# Patient Record
Sex: Male | Born: 1935 | Race: White | Hispanic: No | Marital: Single | Smoking: Never smoker
Health system: Southern US, Community
[De-identification: ages and names within clinical notes are randomized; demographics above are authoritative.]

## PROBLEM LIST (undated history)

## (undated) DIAGNOSIS — F32A Depression, unspecified: Secondary | ICD-10-CM

## (undated) DIAGNOSIS — R251 Tremor, unspecified: Secondary | ICD-10-CM

## (undated) DIAGNOSIS — R112 Nausea with vomiting, unspecified: Secondary | ICD-10-CM

## (undated) DIAGNOSIS — Z9989 Dependence on other enabling machines and devices: Secondary | ICD-10-CM

## (undated) DIAGNOSIS — C801 Malignant (primary) neoplasm, unspecified: Secondary | ICD-10-CM

## (undated) DIAGNOSIS — Z9889 Other specified postprocedural states: Secondary | ICD-10-CM

## (undated) DIAGNOSIS — E291 Testicular hypofunction: Secondary | ICD-10-CM

## (undated) DIAGNOSIS — F419 Anxiety disorder, unspecified: Secondary | ICD-10-CM

## (undated) DIAGNOSIS — C069 Malignant neoplasm of mouth, unspecified: Secondary | ICD-10-CM

## (undated) DIAGNOSIS — Z8701 Personal history of pneumonia (recurrent): Secondary | ICD-10-CM

## (undated) DIAGNOSIS — M1990 Unspecified osteoarthritis, unspecified site: Secondary | ICD-10-CM

## (undated) DIAGNOSIS — T7840XA Allergy, unspecified, initial encounter: Secondary | ICD-10-CM

## (undated) DIAGNOSIS — Z8249 Family history of ischemic heart disease and other diseases of the circulatory system: Secondary | ICD-10-CM

## (undated) DIAGNOSIS — Z860100 Personal history of colon polyps, unspecified: Secondary | ICD-10-CM

## (undated) DIAGNOSIS — J45909 Unspecified asthma, uncomplicated: Secondary | ICD-10-CM

## (undated) DIAGNOSIS — Z87442 Personal history of urinary calculi: Secondary | ICD-10-CM

## (undated) DIAGNOSIS — J189 Pneumonia, unspecified organism: Secondary | ICD-10-CM

## (undated) DIAGNOSIS — F329 Major depressive disorder, single episode, unspecified: Secondary | ICD-10-CM

## (undated) HISTORY — DX: Personal history of colonic polyps: Z86.010

## (undated) HISTORY — PX: SHOULDER ARTHROSCOPY: SHX128

## (undated) HISTORY — DX: Testicular hypofunction: E29.1

## (undated) HISTORY — DX: Family history of ischemic heart disease and other diseases of the circulatory system: Z82.49

## (undated) HISTORY — DX: Tremor, unspecified: R25.1

## (undated) HISTORY — PX: HERNIA REPAIR: SHX51

## (undated) HISTORY — DX: Allergy, unspecified, initial encounter: T78.40XA

## (undated) HISTORY — DX: Personal history of colon polyps, unspecified: Z86.0100

---
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
## 2003-08-27 ENCOUNTER — Encounter: Admission: RE | Admit: 2003-08-27 | Discharge: 2003-08-27 | Payer: Self-pay | Admitting: Family Medicine

## 2006-06-07 ENCOUNTER — Encounter: Admission: RE | Admit: 2006-06-07 | Discharge: 2006-06-07 | Payer: Self-pay | Admitting: Family Medicine

## 2006-06-07 ENCOUNTER — Ambulatory Visit: Payer: Self-pay | Admitting: Family Medicine

## 2006-08-28 ENCOUNTER — Ambulatory Visit: Payer: Self-pay | Admitting: Family Medicine

## 2007-02-26 ENCOUNTER — Ambulatory Visit: Payer: Self-pay | Admitting: Family Medicine

## 2007-04-01 ENCOUNTER — Ambulatory Visit: Payer: Self-pay | Admitting: Family Medicine

## 2007-04-23 ENCOUNTER — Ambulatory Visit: Payer: Self-pay | Admitting: Family Medicine

## 2007-05-05 ENCOUNTER — Ambulatory Visit: Payer: Self-pay | Admitting: Family Medicine

## 2007-05-08 ENCOUNTER — Encounter: Admission: RE | Admit: 2007-05-08 | Discharge: 2007-05-08 | Payer: Self-pay | Admitting: Family Medicine

## 2007-09-23 ENCOUNTER — Ambulatory Visit (HOSPITAL_COMMUNITY): Admission: RE | Admit: 2007-09-23 | Discharge: 2007-09-23 | Payer: Self-pay | Admitting: Orthopedic Surgery

## 2007-10-09 HISTORY — PX: KNEE CARTILAGE SURGERY: SHX688

## 2007-12-22 ENCOUNTER — Ambulatory Visit: Payer: Self-pay | Admitting: Family Medicine

## 2007-12-22 ENCOUNTER — Encounter: Admission: RE | Admit: 2007-12-22 | Discharge: 2007-12-22 | Payer: Self-pay | Admitting: Family Medicine

## 2008-03-18 ENCOUNTER — Ambulatory Visit: Payer: Self-pay | Admitting: Family Medicine

## 2008-09-06 ENCOUNTER — Ambulatory Visit: Payer: Self-pay | Admitting: Family Medicine

## 2008-09-06 ENCOUNTER — Encounter: Admission: RE | Admit: 2008-09-06 | Discharge: 2008-09-06 | Payer: Self-pay | Admitting: Family Medicine

## 2008-10-08 HISTORY — PX: CHOLECYSTECTOMY: SHX55

## 2008-11-26 ENCOUNTER — Ambulatory Visit: Payer: Self-pay | Admitting: Family Medicine

## 2008-12-03 ENCOUNTER — Encounter: Admission: RE | Admit: 2008-12-03 | Discharge: 2008-12-03 | Payer: Self-pay | Admitting: Family Medicine

## 2009-02-17 ENCOUNTER — Ambulatory Visit: Payer: Self-pay | Admitting: Family Medicine

## 2009-02-17 ENCOUNTER — Ambulatory Visit: Payer: Self-pay | Admitting: Surgery

## 2009-11-08 ENCOUNTER — Ambulatory Visit: Payer: Self-pay | Admitting: Family Medicine

## 2009-12-08 ENCOUNTER — Ambulatory Visit: Payer: Self-pay | Admitting: Family Medicine

## 2009-12-14 ENCOUNTER — Ambulatory Visit: Payer: Self-pay | Admitting: Physician Assistant

## 2009-12-14 ENCOUNTER — Encounter: Admission: RE | Admit: 2009-12-14 | Discharge: 2009-12-14 | Payer: Self-pay | Admitting: Family Medicine

## 2009-12-19 ENCOUNTER — Ambulatory Visit: Payer: Self-pay | Admitting: Family Medicine

## 2010-06-23 ENCOUNTER — Ambulatory Visit: Payer: Self-pay | Admitting: Family Medicine

## 2010-12-05 ENCOUNTER — Encounter (INDEPENDENT_AMBULATORY_CARE_PROVIDER_SITE_OTHER): Payer: Medicare Other | Admitting: Family Medicine

## 2010-12-05 DIAGNOSIS — I10 Essential (primary) hypertension: Secondary | ICD-10-CM

## 2010-12-05 DIAGNOSIS — E119 Type 2 diabetes mellitus without complications: Secondary | ICD-10-CM

## 2010-12-05 DIAGNOSIS — F329 Major depressive disorder, single episode, unspecified: Secondary | ICD-10-CM

## 2010-12-05 DIAGNOSIS — E291 Testicular hypofunction: Secondary | ICD-10-CM

## 2011-01-02 ENCOUNTER — Ambulatory Visit (INDEPENDENT_AMBULATORY_CARE_PROVIDER_SITE_OTHER): Payer: BC Managed Care – PPO | Admitting: Family Medicine

## 2011-01-02 DIAGNOSIS — E291 Testicular hypofunction: Secondary | ICD-10-CM

## 2011-01-02 DIAGNOSIS — F329 Major depressive disorder, single episode, unspecified: Secondary | ICD-10-CM

## 2011-01-02 DIAGNOSIS — Z79899 Other long term (current) drug therapy: Secondary | ICD-10-CM

## 2011-01-18 ENCOUNTER — Other Ambulatory Visit: Payer: Self-pay | Admitting: Orthopedic Surgery

## 2011-01-18 DIAGNOSIS — R531 Weakness: Secondary | ICD-10-CM

## 2011-01-18 DIAGNOSIS — M25512 Pain in left shoulder: Secondary | ICD-10-CM

## 2011-01-23 ENCOUNTER — Ambulatory Visit
Admission: RE | Admit: 2011-01-23 | Discharge: 2011-01-23 | Disposition: A | Discharge status: Home / Self Care | Payer: Medicare Other | Source: Ambulatory Visit | Attending: Orthopedic Surgery | Admitting: Orthopedic Surgery

## 2011-01-23 DIAGNOSIS — M25512 Pain in left shoulder: Secondary | ICD-10-CM

## 2011-01-23 DIAGNOSIS — R531 Weakness: Secondary | ICD-10-CM

## 2011-02-16 ENCOUNTER — Telehealth: Payer: Self-pay | Admitting: Family Medicine

## 2011-02-20 NOTE — Op Note (Signed)
NAME:  [name], [name]                ACCOUNT NO.:  [id number]   MEDICAL RECORD NO.:  [id number]          PATIENT TYPE:  AMB   LOCATION:  SDS                          FACILITY:  [facility]   PHYSICIAN:  Burnard Bunting, M.D.    DATE OF BIRTH:  08/29/1934   DATE OF PROCEDURE:  09/23/2007  DATE OF DISCHARGE:                               OPERATIVE REPORT   PREOPERATIVE DIAGNOSIS:  Right shoulder biceps tendon tear, bursitis and  synovitis.   POSTOPERATIVE DIAGNOSES:  1. Right shoulder bursitis with early frozen shoulder.  2. Synovitis.  3. Loose bodies in the joint.  4. Significant humeral head arthritis with previously torn and      retracted biceps tendon.   PROCEDURES:  Right shoulder diagnostic arthroscopy, with extensive  debridement of the glenohumeral joint; with removal of loose body and  subacromial decompression.   SURGEON:  Burnard Bunting, M.D.   ASSISTANT:  None.   ANESTHESIA:  General endotracheal.   BLOOD LOSS:  Minimal.   INDICATIONS:  [name] [name] is a 77 year old patient with right shoulder  pain.  He presents now for operative management after failure of  conservative management and explanation of the risks and benefits.   OPERATIVE FINDINGS:  1. Examination under anesthesia:  Range of motion and forward flexion      is 180.  External rotation at 15 degrees adduction is about 60.      External rotation at 90 degrees abduction is about 80.  2. Diagnostic arthroscopy.      a.     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      b.     Loose body within the joint, measuring 6 x 6 mm.  3. Extensive synovitis within the rotator interval.  4. Intact, but somewhat frayed, rotator cuff from the articular and      bursal surface.  5. Significant bursitis.   PROCEDURE IN DETAIL:  The patient was brought to the operating room,  where general endotracheal  anesthesia was induced.  Preoperative  antibiotics were administered.  The patient was placed in the beach-  chair position, with the head in neutral position.  The right shoulder  was prepped, including the right shoulder, arm and hand prepped in  DuraPrep solution.  The patient was marked.  The instillation of saline  and epinephrine was injected into the subacromial space.  A posterior  portal was then created, after covering the axilla with Ioban.  Skin and  subcutaneous tissue was sharply divided.  The scope was placed into the  posterior portal, then from the posterior portal into the glenohumeral  joint.  The anterior portal was then created under direct visualization.  Diagnostic arthroscopy was performed.  Extensive synovitis was present.  This was resected and  ablated using ArthroCare wand, particularly in  the rotator interval.  The biceps tendon was already torn.  The rotator  cuff appeared intact from the articular  surface.  A significant amount  of glenohumeral arthritis was noted on the humeral head.  A  chondroplasty was performed with removal of loose fragments.  A large  loose body was also removed through the anterior portal.  Following  extensive debridement of the humeral head articular surface and  synovitis within the capsule, as well as the labrum where the biceps  tendon was torn.  Attention was directed to the subacromial space and a  lateral portal was created.  Subacromial decompression with release of  the CA ligament was performed.  The rotator cuff had some partial-  thickness tearing, but no full-thickness tearing.  Following a bursitis  resection and subacromial decompression, the instruments were removed  from the portals, and then switched __________  using 3-0 nylon sutures.  The patient tolerated the procedure well without immediate complication.  The portals were closed using 3-0 nylon.  A bulky dressing was applied.      Burnard Bunting, M.D.   Electronically Signed     GSD/MEDQ  D:  09/23/2007  T:  09/23/2007  Job:  [id number]

## 2011-03-20 NOTE — Telephone Encounter (Signed)
DONE

## 2011-04-10 ENCOUNTER — Other Ambulatory Visit: Payer: Self-pay

## 2011-05-11 ENCOUNTER — Encounter: Payer: Self-pay | Admitting: Family Medicine

## 2011-06-28 ENCOUNTER — Other Ambulatory Visit: Payer: Self-pay | Admitting: Orthopedic Surgery

## 2011-06-28 DIAGNOSIS — R609 Edema, unspecified: Secondary | ICD-10-CM

## 2011-06-28 DIAGNOSIS — M25561 Pain in right knee: Secondary | ICD-10-CM

## 2011-06-28 DIAGNOSIS — R531 Weakness: Secondary | ICD-10-CM

## 2011-06-29 ENCOUNTER — Other Ambulatory Visit: Payer: Self-pay | Admitting: *Deleted

## 2011-06-29 ENCOUNTER — Telehealth: Payer: Self-pay | Admitting: Family Medicine

## 2011-06-29 MED ORDER — METFORMIN HCL 850 MG PO TABS: 850.0000 mg | ORAL_TABLET | Freq: Two times a day (BID) | ORAL | Status: DC

## 2011-06-29 MED ORDER — PIOGLITAZONE HCL 15 MG PO TABS: 15.0000 mg | ORAL_TABLET | Freq: Every day | ORAL | Status: DC

## 2011-06-29 NOTE — Telephone Encounter (Signed)
Sent in prescription for Pioglitazone/metformin 15-850mg  1 po BID #60 with 1 refill to [facility]. CM,LPN

## 2011-07-06 ENCOUNTER — Ambulatory Visit
Admission: RE | Admit: 2011-07-06 | Discharge: 2011-07-06 | Disposition: A | Discharge status: Home / Self Care | Payer: Medicare Other | Source: Ambulatory Visit | Attending: Orthopedic Surgery | Admitting: Orthopedic Surgery

## 2011-07-06 DIAGNOSIS — M25561 Pain in right knee: Secondary | ICD-10-CM

## 2011-07-06 DIAGNOSIS — R609 Edema, unspecified: Secondary | ICD-10-CM

## 2011-07-06 DIAGNOSIS — R531 Weakness: Secondary | ICD-10-CM

## 2011-07-09 ENCOUNTER — Telehealth: Payer: Self-pay | Admitting: Family Medicine

## 2011-07-09 MED ORDER — TESTOSTERONE 20.25 MG/ACT (1.62%) TD GEL: 4.0000 | Freq: Every day | TRANSDERMAL | Status: DC

## 2011-07-09 NOTE — Telephone Encounter (Signed)
Called med in left message for pt to make appt to see at end of [facility]

## 2011-07-09 NOTE — Telephone Encounter (Signed)
Renew his AndroGel for one month and set him up an appointment.

## 2011-07-16 LAB — BASIC METABOLIC PANEL
BUN: 17
CO2: 26
Calcium: 9.1
Chloride: 104
Creatinine, Ser: 1.15
GFR calc Af Amer: >60
GFR calc non Af Amer: >60
Glucose, Bld: 123 — ABNORMAL HIGH
Potassium: 4.3
Sodium: 135

## 2011-07-16 LAB — CBC
HCT: 40.8
Hemoglobin: 13.9
MCHC: 34
MCV: 92.2
Platelets: 310
RBC: 4.42
RDW: 13.5
WBC: 6.7

## 2011-08-03 ENCOUNTER — Telehealth: Payer: Self-pay | Admitting: Family Medicine

## 2011-08-03 NOTE — Telephone Encounter (Signed)
On 08/03/11 I called out metformin times 1 month supply with no refills to [facility] and patient was informed that he will need an Ov in order to receive anymore refills. CLS

## 2011-09-24 ENCOUNTER — Telehealth: Payer: Self-pay | Admitting: Internal Medicine

## 2011-09-24 NOTE — Telephone Encounter (Signed)
He needs an appointment for followup. Once you make it then you can renew his meds

## 2011-09-24 NOTE — Telephone Encounter (Signed)
I believe this goes to you 

## 2011-09-25 ENCOUNTER — Telehealth: Payer: Self-pay

## 2011-09-25 ENCOUNTER — Other Ambulatory Visit: Payer: Self-pay

## 2011-09-25 MED ORDER — METFORMIN HCL 850 MG PO TABS: 850.0000 mg | ORAL_TABLET | Freq: Two times a day (BID) | ORAL | Status: DC

## 2011-09-25 MED ORDER — PIOGLITAZONE HCL 15 MG PO TABS: 15.0000 mg | ORAL_TABLET | Freq: Every day | ORAL | Status: DC

## 2011-09-25 NOTE — Telephone Encounter (Signed)
Refilled metformin and actos with no refill he has been called and left message to make an appt

## 2011-09-25 NOTE — Telephone Encounter (Signed)
Left message on cell phone to call and make an appt for a follow-up to receive meds

## 2011-09-25 NOTE — Telephone Encounter (Signed)
Left message for pt to make an appt and refilled med only for 30 days

## 2011-10-15 ENCOUNTER — Encounter (HOSPITAL_COMMUNITY): Payer: Self-pay

## 2011-10-23 ENCOUNTER — Encounter (HOSPITAL_COMMUNITY)
Admission: RE | Admit: 2011-10-23 | Discharge: 2011-10-23 | Disposition: A | Discharge status: Home / Self Care | Payer: Medicare Other | Source: Ambulatory Visit | Attending: Orthopedic Surgery | Admitting: Orthopedic Surgery

## 2011-10-23 ENCOUNTER — Other Ambulatory Visit (HOSPITAL_COMMUNITY): Payer: Self-pay | Admitting: Orthopedic Surgery

## 2011-10-23 ENCOUNTER — Other Ambulatory Visit: Payer: Self-pay

## 2011-10-23 ENCOUNTER — Encounter (HOSPITAL_COMMUNITY): Payer: Self-pay

## 2011-10-23 HISTORY — DX: Other specified postprocedural states: R11.2

## 2011-10-23 HISTORY — DX: Unspecified asthma, uncomplicated: J45.909

## 2011-10-23 HISTORY — DX: Unspecified osteoarthritis, unspecified site: M19.90

## 2011-10-23 HISTORY — DX: Other specified postprocedural states: Z98.890

## 2011-10-23 HISTORY — DX: Malignant (primary) neoplasm, unspecified: C80.1

## 2011-10-23 LAB — DIFFERENTIAL
Lymphocytes Relative: 24 % (ref 12–46)
Lymphs Abs: 1.6 10*3/uL (ref 0.7–4.0)
Monocytes Relative: 10 % (ref 3–12)
Neutrophils Relative %: 61 % (ref 43–77)

## 2011-10-23 LAB — TYPE AND SCREEN
ABO/RH(D): O POS
Antibody Screen: NEGATIVE

## 2011-10-23 LAB — BASIC METABOLIC PANEL
CO2: 27 mEq/L (ref 19–32)
Calcium: 9.9 mg/dL (ref 8.4–10.5)
GFR calc non Af Amer: 81 mL/min — ABNORMAL LOW (ref >90)
Glucose, Bld: 179 mg/dL — ABNORMAL HIGH (ref 70–99)
Potassium: 5.3 mEq/L — ABNORMAL HIGH (ref 3.5–5.1)
Sodium: 138 mEq/L (ref 135–145)

## 2011-10-23 LAB — CBC
Hemoglobin: 14.2 g/dL (ref 13.0–17.0)
MCV: 93 fL (ref 78.0–100.0)
Platelets: 243 10*3/uL (ref 150–400)
RBC: 4.54 MIL/uL (ref 4.22–5.81)
WBC: 6.6 10*3/uL (ref 4.0–10.5)

## 2011-10-23 NOTE — Pre-Procedure Instructions (Signed)
20 [name] Bell  10/23/2011   Your procedure is scheduled on:  Tuesday, Jan 22  Report to [facility] at 0930 AM.  Call this number if you have problems the morning of surgery: [phone]   Remember:   Do not eat food:After Midnight.  May have clear liquids: up to 4 Hours before arrival.  Clear liquids include soda, tea, black coffee, apple or grape juice, broth.  Take these medicines the morning of surgery with A SIP OF WATER: Advair   Do not wear jewelry, make-up or nail polish.  Do not wear lotions, powders, or perfumes. You may wear deodorant.  Do not shave 48 hours prior to surgery.  Do not bring valuables to the hospital.  Contacts, dentures or bridgework may not be worn into surgery.  Leave suitcase in the car. After surgery it may be brought to your room.  For patients admitted to the hospital, checkout time is 11:00 AM the day of discharge.   Patients discharged the day of surgery will not be allowed to drive home.  Name and phone number of your driver: N/A  Special Instructions: CHG Shower Use Special Wash: 1/2 bottle night before surgery and 1/2 bottle morning of surgery.   Please read over the following fact sheets that you were given: Pain Booklet, Coughing and Deep Breathing, Blood Transfusion Information, Total Joint Packet, MRSA Information and Surgical Site Infection Prevention

## 2011-10-24 NOTE — H&P (Signed)
NAME:  [name] Bell, [name] Bell                ACCOUNT NO.:  [id number]  MEDICAL RECORD NO.:  [id number]  LOCATION:  SDS                          FACILITY:  [facility]  PHYSICIAN:  Burnard Bunting, M.D.    DATE OF BIRTH:  1934/07/29  DATE OF ADMISSION:  10/23/2011 DATE OF DISCHARGE:                             HISTORY & PHYSICAL   ____  Q#1.1.  [facility] NUMBER:  [id number]  CHIEF COMPLAINT:  Right knee pain.  HISTORY OF PRESENT ILLNESS:  [name] Bell is a 78 year old patient with right knee pain.  He has incapacitating, debilitating right knee pain that keeps him from walking.  He is using a cane.  He is requiring pain medicine, has night pain, rest pain.  Pain interferes with his activities of daily living.  It is so bad that he cannot work anymore. He is here to discuss surgical intervention when I saw him last, he actually had the food.  Today, he states he is feeling much better.  He has no fever and chills.  He is off all antibiotics.  He states that he has made arrangements with his work in order to take time off.  He is requiring narcotic pain medicine for pain management because other pain management modalities had failed including injection of cortisone and viscosupplementation.  CURRENT MEDICATIONS:  Metformin, lisinopril, simvastatin, AndroGel.  ALLERGIES:  SULFA drugs.  PAST SURGICAL HISTORY:  Bowel and bladder removed, right shoulder surgery, right knee meniscal surgery.  FAMILY HISTORY:  Family medical positive for heart disease.  He is single.  No family history of DVT or pulmonary embolism.  The patient does have support network around town.  He works as a Runner, broadcasting/film/video.  REVIEW OF SYSTEMS:  All other systems reviewed and are negative other than related to right knee.  PHYSICAL EXAMINATION:  GENERAL:  He is well developed, well nourished, in no acute distress.  Alert and oriented.  Normal body mass index. CHEST:  Clear to auscultation.  No wheezing.  No rhonchi. HEART:   Heart beats with regular rate and rhythm. ABDOMEN:  Benign. EXTREMITIES:  Right knee demonstrates palpable pedal pulses, slight varus alignment, medial __________ and lateral joint line tenderness, intact extensor mechanisms, stable collateral cruciate ligament.  No other masses, lymphadenopathy, or skin changes noted in the right knee region.  Radiographs showed tricompartmental osteoarthritis with bone-on-bone changes.  IMPRESSION:  Right knee arthritis refractory to nonoperative management.  PLAN:  Right total knee replacement.  Risks and benefits were discussed with the patient including but not limited to infection, nerve, and vessel damage, and complete pain relief, knee stiffness, additional need for revision, the length of rehab was also discussed.  All questions were answered.     Burnard Bunting, M.D.     GSD/MEDQ  D:  10/23/2011  T:  10/24/2011  Job:  [id number]

## 2011-10-29 ENCOUNTER — Telehealth: Payer: Self-pay | Admitting: Family Medicine

## 2011-10-29 MED ORDER — CEFAZOLIN SODIUM-DEXTROSE 2-3 GM-% IV SOLR
2.0000 g | INTRAVENOUS | Status: DC
  Filled 2011-10-29: qty 50

## 2011-10-29 MED ORDER — SIMVASTATIN 80 MG PO TABS: 80.0000 mg | ORAL_TABLET | ORAL | Status: DC

## 2011-10-29 NOTE — Telephone Encounter (Signed)
SENT MED IN 

## 2011-10-30 ENCOUNTER — Ambulatory Visit (HOSPITAL_COMMUNITY): Payer: Medicare Other | Admitting: Certified Registered"

## 2011-10-30 ENCOUNTER — Ambulatory Visit (HOSPITAL_COMMUNITY): Payer: Medicare Other

## 2011-10-30 ENCOUNTER — Encounter (HOSPITAL_COMMUNITY)
Admission: RE | Disposition: A | Discharge status: Skilled Nursing Facility | Payer: Self-pay | Source: Ambulatory Visit | Attending: Orthopedic Surgery

## 2011-10-30 ENCOUNTER — Inpatient Hospital Stay (HOSPITAL_COMMUNITY)
Admission: RE | Admit: 2011-10-30 | Discharge: 2011-11-06 | DRG: 470 | Disposition: A | Discharge status: Skilled Nursing Facility | Payer: Medicare Other | Source: Ambulatory Visit | Attending: Orthopedic Surgery | Admitting: Orthopedic Surgery

## 2011-10-30 ENCOUNTER — Encounter (HOSPITAL_COMMUNITY): Payer: Self-pay | Admitting: Certified Registered"

## 2011-10-30 ENCOUNTER — Encounter (HOSPITAL_COMMUNITY): Payer: Self-pay | Admitting: *Deleted

## 2011-10-30 DIAGNOSIS — Z01812 Encounter for preprocedural laboratory examination: Secondary | ICD-10-CM

## 2011-10-30 DIAGNOSIS — M1710 Unilateral primary osteoarthritis, unspecified knee: Principal | ICD-10-CM | POA: Diagnosis present

## 2011-10-30 DIAGNOSIS — Z882 Allergy status to sulfonamides status: Secondary | ICD-10-CM

## 2011-10-30 DIAGNOSIS — M1712 Unilateral primary osteoarthritis, left knee: Secondary | ICD-10-CM

## 2011-10-30 DIAGNOSIS — M1711 Unilateral primary osteoarthritis, right knee: Secondary | ICD-10-CM | POA: Diagnosis present

## 2011-10-30 HISTORY — PX: KNEE ARTHROPLASTY: SHX992

## 2011-10-30 LAB — GLUCOSE, CAPILLARY
Glucose-Capillary: 121 mg/dL — ABNORMAL HIGH (ref 70–99)
Glucose-Capillary: 175 mg/dL — ABNORMAL HIGH (ref 70–99)

## 2011-10-30 SURGERY — ARTHROPLASTY, KNEE, TOTAL, USING IMAGELESS COMPUTER-ASSISTED NAVIGATION
Anesthesia: General | Site: Knee | Laterality: Right | Wound class: Clean

## 2011-10-30 MED ORDER — ONDANSETRON HCL 4 MG/2ML IJ SOLN
4.0000 mg | Freq: Once | INTRAMUSCULAR | Status: AC | PRN
  Administered 2011-10-30: 4 mg via INTRAVENOUS

## 2011-10-30 MED ORDER — MIDAZOLAM HCL 2 MG/2ML IJ SOLN: 1.0000 mg | INTRAMUSCULAR | Status: DC | PRN

## 2011-10-30 MED ORDER — NALOXONE HCL 0.4 MG/ML IJ SOLN: 0.4000 mg | INTRAMUSCULAR | Status: DC | PRN

## 2011-10-30 MED ORDER — ONDANSETRON HCL 4 MG/2ML IJ SOLN
INTRAMUSCULAR | Status: DC | PRN
  Administered 2011-10-30: 4 mg via INTRAVENOUS

## 2011-10-30 MED ORDER — FLUTICASONE-SALMETEROL 250-50 MCG/DOSE IN AEPB
1.0000 | INHALATION_SPRAY | Freq: Two times a day (BID) | RESPIRATORY_TRACT | Status: DC
  Administered 2011-10-30 – 2011-11-06 (×11): 1 via RESPIRATORY_TRACT
  Filled 2011-10-30 (×2): qty 14

## 2011-10-30 MED ORDER — ACETAMINOPHEN 325 MG PO TABS: 650.0000 mg | ORAL_TABLET | Freq: Four times a day (QID) | ORAL | Status: DC | PRN

## 2011-10-30 MED ORDER — ACETAMINOPHEN 10 MG/ML IV SOLN
INTRAVENOUS | Status: AC
  Filled 2011-10-30: qty 100

## 2011-10-30 MED ORDER — SODIUM CHLORIDE 0.9 % IJ SOLN: 9.0000 mL | INTRAMUSCULAR | Status: DC | PRN

## 2011-10-30 MED ORDER — VANCOMYCIN HCL IN DEXTROSE 1-5 GM/200ML-% IV SOLN
1000.0000 mg | Freq: Two times a day (BID) | INTRAVENOUS | Status: DC
  Administered 2011-10-30: 1000 mg via INTRAVENOUS
  Filled 2011-10-30 (×3): qty 200

## 2011-10-30 MED ORDER — FENTANYL CITRATE 0.05 MG/ML IJ SOLN: 50.0000 ug | INTRAMUSCULAR | Status: DC | PRN

## 2011-10-30 MED ORDER — MIDAZOLAM HCL 5 MG/5ML IJ SOLN
INTRAMUSCULAR | Status: DC | PRN
  Administered 2011-10-30: 2 mg via INTRAVENOUS

## 2011-10-30 MED ORDER — ROSUVASTATIN CALCIUM 20 MG PO TABS
20.0000 mg | ORAL_TABLET | Freq: Every day | ORAL | Status: DC
  Administered 2011-10-30 – 2011-11-05 (×7): 20 mg via ORAL
  Filled 2011-10-30 (×8): qty 1

## 2011-10-30 MED ORDER — ONDANSETRON HCL 4 MG/2ML IJ SOLN: 4.0000 mg | Freq: Four times a day (QID) | INTRAMUSCULAR | Status: DC | PRN

## 2011-10-30 MED ORDER — DROPERIDOL 2.5 MG/ML IJ SOLN
INTRAMUSCULAR | Status: DC | PRN
  Administered 2011-10-30: 0.625 mg via INTRAVENOUS

## 2011-10-30 MED ORDER — CHLORHEXIDINE GLUCONATE 4 % EX LIQD: 60.0000 mL | Freq: Once | CUTANEOUS | Status: DC

## 2011-10-30 MED ORDER — MENTHOL 3 MG MT LOZG
1.0000 | LOZENGE | OROMUCOSAL | Status: DC | PRN
  Filled 2011-10-30: qty 9

## 2011-10-30 MED ORDER — CLONIDINE HCL (ANALGESIA) 100 MCG/ML EP SOLN
EPIDURAL | Status: DC | PRN
  Administered 2011-10-30: .9 mL via INTRA_ARTICULAR

## 2011-10-30 MED ORDER — WARFARIN SODIUM 7.5 MG PO TABS
7.5000 mg | ORAL_TABLET | Freq: Once | ORAL | Status: AC
  Administered 2011-10-30: 7.5 mg via ORAL
  Filled 2011-10-30: qty 1

## 2011-10-30 MED ORDER — ONDANSETRON HCL 4 MG PO TABS: 4.0000 mg | ORAL_TABLET | Freq: Four times a day (QID) | ORAL | Status: DC | PRN

## 2011-10-30 MED ORDER — DIPHENHYDRAMINE HCL 50 MG/ML IJ SOLN: 12.5000 mg | Freq: Four times a day (QID) | INTRAMUSCULAR | Status: DC | PRN

## 2011-10-30 MED ORDER — METHOCARBAMOL 500 MG PO TABS: 500.0000 mg | ORAL_TABLET | Freq: Four times a day (QID) | ORAL | Status: DC | PRN

## 2011-10-30 MED ORDER — VITAMIN C 500 MG PO TABS
1000.0000 mg | ORAL_TABLET | Freq: Every day | ORAL | Status: DC
Start: 2011-10-30 — End: 2011-11-06
  Administered 2011-10-30 – 2011-11-06 (×8): 1000 mg via ORAL
  Filled 2011-10-30 (×8): qty 2

## 2011-10-30 MED ORDER — LISINOPRIL 10 MG PO TABS
10.0000 mg | ORAL_TABLET | Freq: Every day | ORAL | Status: DC
  Administered 2011-10-31 – 2011-11-06 (×6): 10 mg via ORAL
  Filled 2011-10-30 (×7): qty 1

## 2011-10-30 MED ORDER — MORPHINE SULFATE 4 MG/ML IJ SOLN
INTRAMUSCULAR | Status: DC | PRN
  Administered 2011-10-30: 4 mg via SUBCUTANEOUS
  Administered 2011-10-30: 4 mg via INTRAVENOUS

## 2011-10-30 MED ORDER — VANCOMYCIN HCL IN DEXTROSE 1-5 GM/200ML-% IV SOLN
INTRAVENOUS | Status: AC
  Filled 2011-10-30: qty 200

## 2011-10-30 MED ORDER — METHOCARBAMOL 100 MG/ML IJ SOLN
500.0000 mg | INTRAVENOUS | Status: AC
  Administered 2011-10-30: 500 mg via INTRAVENOUS
  Filled 2011-10-30: qty 5

## 2011-10-30 MED ORDER — FENTANYL CITRATE 0.05 MG/ML IJ SOLN
INTRAMUSCULAR | Status: DC | PRN
  Administered 2011-10-30 (×2): 50 ug via INTRAVENOUS
  Administered 2011-10-30: 100 ug via INTRAVENOUS
  Administered 2011-10-30: 150 ug via INTRAVENOUS
  Administered 2011-10-30 (×2): 50 ug via INTRAVENOUS
  Administered 2011-10-30: 100 ug via INTRAVENOUS

## 2011-10-30 MED ORDER — HYDROMORPHONE HCL PF 1 MG/ML IJ SOLN
0.2500 mg | INTRAMUSCULAR | Status: DC | PRN
  Administered 2011-10-30 (×2): 0.5 mg via INTRAVENOUS

## 2011-10-30 MED ORDER — FENTANYL CITRATE 0.05 MG/ML IJ SOLN
INTRAMUSCULAR | Status: AC
  Filled 2011-10-30: qty 2

## 2011-10-30 MED ORDER — PROPOFOL 10 MG/ML IV EMUL
INTRAVENOUS | Status: DC | PRN
  Administered 2011-10-30: 180 mg via INTRAVENOUS

## 2011-10-30 MED ORDER — PATIENT'S GUIDE TO USING COUMADIN BOOK
Freq: Once | Status: AC
  Administered 2011-10-30: 21:00:00
  Filled 2011-10-30: qty 1

## 2011-10-30 MED ORDER — LACTATED RINGERS IV SOLN
INTRAVENOUS | Status: DC
  Administered 2011-10-30: 10:00:00 via INTRAVENOUS

## 2011-10-30 MED ORDER — ACETAMINOPHEN 10 MG/ML IV SOLN
INTRAVENOUS | Status: DC | PRN
  Administered 2011-10-30: 1000 mg via INTRAVENOUS

## 2011-10-30 MED ORDER — VANCOMYCIN HCL IN DEXTROSE 1-5 GM/200ML-% IV SOLN
1000.0000 mg | Freq: Once | INTRAVENOUS | Status: AC
  Administered 2011-10-30: 1000 mg via INTRAVENOUS

## 2011-10-30 MED ORDER — METOCLOPRAMIDE HCL 5 MG/ML IJ SOLN
5.0000 mg | Freq: Three times a day (TID) | INTRAMUSCULAR | Status: DC | PRN
  Filled 2011-10-30: qty 2

## 2011-10-30 MED ORDER — BUPIVACAINE-EPINEPHRINE 0.5% -1:200000 IJ SOLN
INTRAMUSCULAR | Status: DC | PRN
  Administered 2011-10-30: 20 mL

## 2011-10-30 MED ORDER — HYDROMORPHONE 0.3 MG/ML IV SOLN
INTRAVENOUS | Status: DC
Start: 2011-10-30 — End: 2011-11-01
  Administered 2011-10-30: 19:00:00 via INTRAVENOUS
  Administered 2011-10-30: 0.3 mg via INTRAVENOUS
  Administered 2011-10-31: 0.9 mg via INTRAVENOUS
  Administered 2011-10-31: 1.2 mg via INTRAVENOUS
  Administered 2011-10-31: 0.3 mg via INTRAVENOUS
  Administered 2011-10-31 (×2): 0.6 mg via INTRAVENOUS
  Administered 2011-10-31: 0.3 mg via INTRAVENOUS
  Administered 2011-11-01: 0.9 mg via INTRAVENOUS
  Administered 2011-11-01: 0.3 mg via INTRAVENOUS
  Filled 2011-10-30: qty 25

## 2011-10-30 MED ORDER — ROCURONIUM BROMIDE 100 MG/10ML IV SOLN
INTRAVENOUS | Status: DC | PRN
  Administered 2011-10-30: 50 mg via INTRAVENOUS

## 2011-10-30 MED ORDER — ACETAMINOPHEN 650 MG RE SUPP: 650.0000 mg | Freq: Four times a day (QID) | RECTAL | Status: DC | PRN

## 2011-10-30 MED ORDER — PIOGLITAZONE HCL 15 MG PO TABS
15.0000 mg | ORAL_TABLET | Freq: Every day | ORAL | Status: DC
  Administered 2011-10-30 – 2011-11-06 (×8): 15 mg via ORAL
  Filled 2011-10-30 (×8): qty 1

## 2011-10-30 MED ORDER — OXYCODONE HCL 5 MG PO TABS
5.0000 mg | ORAL_TABLET | ORAL | Status: DC | PRN
  Administered 2011-11-01 – 2011-11-06 (×14): 10 mg via ORAL
  Filled 2011-10-30 (×15): qty 2

## 2011-10-30 MED ORDER — DIPHENHYDRAMINE HCL 12.5 MG/5ML PO ELIX
12.5000 mg | ORAL_SOLUTION | Freq: Four times a day (QID) | ORAL | Status: DC | PRN
  Filled 2011-10-30: qty 5

## 2011-10-30 MED ORDER — HYDROMORPHONE HCL PF 1 MG/ML IJ SOLN: 0.5000 mg | INTRAMUSCULAR | Status: DC | PRN

## 2011-10-30 MED ORDER — VITAMIN B-12 1000 MCG PO TABS
1000.0000 ug | ORAL_TABLET | Freq: Every day | ORAL | Status: DC
  Administered 2011-10-30 – 2011-11-06 (×8): 1000 ug via ORAL
  Filled 2011-10-30 (×8): qty 1

## 2011-10-30 MED ORDER — INSULIN ASPART 100 UNIT/ML ~~LOC~~ SOLN
0.0000 [IU] | Freq: Three times a day (TID) | SUBCUTANEOUS | Status: DC
  Administered 2011-10-31 – 2011-11-01 (×3): 3 [IU] via SUBCUTANEOUS
  Administered 2011-11-01: 2 [IU] via SUBCUTANEOUS
  Administered 2011-11-02: 5 [IU] via SUBCUTANEOUS
  Administered 2011-11-02: 3 [IU] via SUBCUTANEOUS
  Administered 2011-11-02 – 2011-11-03 (×2): 8 [IU] via SUBCUTANEOUS
  Administered 2011-11-03: 5 [IU] via SUBCUTANEOUS
  Administered 2011-11-04: 3 [IU] via SUBCUTANEOUS
  Administered 2011-11-04: 5 [IU] via SUBCUTANEOUS
  Administered 2011-11-04 – 2011-11-05 (×2): 3 [IU] via SUBCUTANEOUS
  Administered 2011-11-05: 2 [IU] via SUBCUTANEOUS
  Administered 2011-11-05 – 2011-11-06 (×3): 5 [IU] via SUBCUTANEOUS
  Filled 2011-10-30 (×3): qty 3

## 2011-10-30 MED ORDER — MULTI-VITAMIN/MINERALS PO TABS
1.0000 | ORAL_TABLET | Freq: Every day | ORAL | Status: DC
  Administered 2011-10-30 – 2011-11-06 (×8): 1 via ORAL
  Filled 2011-10-30 (×8): qty 1

## 2011-10-30 MED ORDER — LACTATED RINGERS IV SOLN
INTRAVENOUS | Status: DC | PRN
  Administered 2011-10-30: 11:00:00 via INTRAVENOUS

## 2011-10-30 MED ORDER — METHOCARBAMOL 100 MG/ML IJ SOLN
500.0000 mg | Freq: Four times a day (QID) | INTRAVENOUS | Status: DC | PRN
  Filled 2011-10-30: qty 5

## 2011-10-30 MED ORDER — METFORMIN HCL 850 MG PO TABS
850.0000 mg | ORAL_TABLET | Freq: Two times a day (BID) | ORAL | Status: DC
  Administered 2011-10-30 – 2011-11-06 (×14): 850 mg via ORAL
  Filled 2011-10-30 (×16): qty 1

## 2011-10-30 MED ORDER — METOCLOPRAMIDE HCL 10 MG PO TABS: 5.0000 mg | ORAL_TABLET | Freq: Three times a day (TID) | ORAL | Status: DC | PRN

## 2011-10-30 MED ORDER — TESTOSTERONE 20.25 MG/ACT (1.62%) TD GEL: 4.0000 | Freq: Every day | TRANSDERMAL | Status: DC

## 2011-10-30 MED ORDER — PHENOL 1.4 % MT LIQD
1.0000 | OROMUCOSAL | Status: DC | PRN
  Filled 2011-10-30: qty 177

## 2011-10-30 MED ORDER — WARFARIN VIDEO
Freq: Once | Status: AC
  Administered 2011-10-30: 21:00:00

## 2011-10-30 MED ORDER — SODIUM CHLORIDE 0.9 % IV SOLN
INTRAVENOUS | Status: DC | PRN
  Administered 2011-10-30 (×2): via INTRAVENOUS

## 2011-10-30 MED ORDER — POTASSIUM CHLORIDE IN NACL 20-0.9 MEQ/L-% IV SOLN
INTRAVENOUS | Status: AC
  Administered 2011-10-30: 22:00:00 via INTRAVENOUS
  Filled 2011-10-30 (×3): qty 1000

## 2011-10-30 MED ORDER — MIDAZOLAM HCL 2 MG/2ML IJ SOLN
INTRAMUSCULAR | Status: AC
  Filled 2011-10-30: qty 2

## 2011-10-30 SURGICAL SUPPLY — 77 items
BANDAGE ELASTIC 4 VELCRO ST LF (GAUZE/BANDAGES/DRESSINGS) ×2 IMPLANT
BANDAGE ELASTIC 6 VELCRO ST LF (GAUZE/BANDAGES/DRESSINGS) ×2 IMPLANT
BANDAGE ESMARK 6X9 LF (GAUZE/BANDAGES/DRESSINGS) ×1 IMPLANT
BLADE SAG 18X100X1.27 (BLADE) ×2 IMPLANT
BLADE SAW SGTL 13.0X1.19X90.0M (BLADE) ×2 IMPLANT
BNDG COHESIVE 6X5 TAN STRL LF (GAUZE/BANDAGES/DRESSINGS) ×2 IMPLANT
BNDG ELASTIC 6X10 VLCR STRL LF (GAUZE/BANDAGES/DRESSINGS) ×6 IMPLANT
BNDG ESMARK 6X9 LF (GAUZE/BANDAGES/DRESSINGS) ×2
BOWL SMART MIX CTS (DISPOSABLE) ×2 IMPLANT
CEMENT HV SMART SET (Cement) ×4 IMPLANT
CLOTH BEACON ORANGE TIMEOUT ST (SAFETY) ×2 IMPLANT
COVER BACK TABLE 24X17X13 BIG (DRAPES) IMPLANT
COVER SURGICAL LIGHT HANDLE (MISCELLANEOUS) ×2 IMPLANT
CUFF TOURNIQUET SINGLE 34IN LL (TOURNIQUET CUFF) ×2 IMPLANT
CUFF TOURNIQUET SINGLE 44IN (TOURNIQUET CUFF) IMPLANT
DRAPE INCISE IOBAN 66X45 STRL (DRAPES) ×2 IMPLANT
DRAPE ORTHO SPLIT 77X108 STRL (DRAPES) ×3
DRAPE PROXIMA HALF (DRAPES) ×2 IMPLANT
DRAPE SURG ORHT 6 SPLT 77X108 (DRAPES) ×3 IMPLANT
DRAPE U-SHAPE 47X51 STRL (DRAPES) ×2 IMPLANT
DRAPE X-RAY CASS 24X20 (DRAPES) IMPLANT
DRSG PAD ABDOMINAL 8X10 ST (GAUZE/BANDAGES/DRESSINGS) ×2 IMPLANT
DURAPREP 26ML APPLICATOR (WOUND CARE) ×2 IMPLANT
ELECT REM PT RETURN 9FT ADLT (ELECTROSURGICAL) ×2
ELECTRODE REM PT RTRN 9FT ADLT (ELECTROSURGICAL) ×1 IMPLANT
EVACUATOR 1/8 PVC DRAIN (DRAIN) ×2 IMPLANT
FACESHIELD LNG OPTICON STERILE (SAFETY) ×2 IMPLANT
FLUID NSS /IRRIG 3000 ML XXX (IV SOLUTION) ×2 IMPLANT
GAUZE SPONGE 4X4 12PLY STRL LF (GAUZE/BANDAGES/DRESSINGS) ×2 IMPLANT
GAUZE XEROFORM 5X9 LF (GAUZE/BANDAGES/DRESSINGS) ×2 IMPLANT
GLOVE BIO SURGEON ST LM GN SZ9 (GLOVE) ×2 IMPLANT
GLOVE BIOGEL PI IND STRL 8 (GLOVE) ×1 IMPLANT
GLOVE BIOGEL PI INDICATOR 8 (GLOVE) ×1
GLOVE SURG ORTHO 8.0 STRL STRW (GLOVE) ×2 IMPLANT
GOWN PREVENTION PLUS LG XLONG (DISPOSABLE) ×2 IMPLANT
GOWN PREVENTION PLUS XLARGE (GOWN DISPOSABLE) ×2 IMPLANT
GOWN STRL NON-REIN LRG LVL3 (GOWN DISPOSABLE) ×6 IMPLANT
HANDPIECE INTERPULSE COAX TIP (DISPOSABLE) ×1
HOOD PEEL AWAY FACE SHEILD DIS (HOOD) ×6 IMPLANT
IMMOBILIZER KNEE 20 (SOFTGOODS)
IMMOBILIZER KNEE 20 THIGH 36 (SOFTGOODS) IMPLANT
IMMOBILIZER KNEE 22 UNIV (SOFTGOODS) ×2 IMPLANT
IMMOBILIZER KNEE 24 THIGH 36 (MISCELLANEOUS) IMPLANT
IMMOBILIZER KNEE 24 UNIV (MISCELLANEOUS)
KIT BASIN OR (CUSTOM PROCEDURE TRAY) ×2 IMPLANT
KIT ROOM TURNOVER OR (KITS) ×2 IMPLANT
MANIFOLD NEPTUNE II (INSTRUMENTS) ×2 IMPLANT
MARKER SPHERE PSV REFLC THRD 5 (MARKER) ×6 IMPLANT
NEEDLE 18GX1X1/2 (RX/OR ONLY) (NEEDLE) ×2 IMPLANT
NEEDLE SPNL 18GX3.5 QUINCKE PK (NEEDLE) ×2 IMPLANT
NS IRRIG 1000ML POUR BTL (IV SOLUTION) ×2 IMPLANT
PACK TOTAL JOINT (CUSTOM PROCEDURE TRAY) ×2 IMPLANT
PAD ARMBOARD 7.5X6 YLW CONV (MISCELLANEOUS) ×4 IMPLANT
PAD CAST 4YDX4 CTTN HI CHSV (CAST SUPPLIES) ×1 IMPLANT
PADDING CAST COTTON 4X4 STRL (CAST SUPPLIES) ×1
PADDING CAST COTTON 6X4 STRL (CAST SUPPLIES) ×6 IMPLANT
PADDING WEBRIL 4 STERILE (GAUZE/BANDAGES/DRESSINGS) ×2 IMPLANT
PADDING WEBRIL 6 STERILE (GAUZE/BANDAGES/DRESSINGS) ×2 IMPLANT
PIN SCHANZ 4MM 130MM (PIN) ×14 IMPLANT
RUBBERBAND STERILE (MISCELLANEOUS) ×2 IMPLANT
SET HNDPC FAN SPRY TIP SCT (DISPOSABLE) ×1 IMPLANT
SPONGE GAUZE 4X4 12PLY (GAUZE/BANDAGES/DRESSINGS) ×2 IMPLANT
SPONGE LAP 18X18 X RAY DECT (DISPOSABLE) IMPLANT
STAPLER VISISTAT 35W (STAPLE) ×2 IMPLANT
SUCTION FRAZIER TIP 10 FR DISP (SUCTIONS) ×2 IMPLANT
SUT ETHILON 3 0 PS 1 (SUTURE) ×4 IMPLANT
SUT VIC AB 0 CTB1 27 (SUTURE) ×6 IMPLANT
SUT VIC AB 1 CT1 27 (SUTURE) ×5
SUT VIC AB 1 CT1 27XBRD ANBCTR (SUTURE) ×5 IMPLANT
SUT VIC AB 2-0 CT1 27 (SUTURE) ×2
SUT VIC AB 2-0 CT1 TAPERPNT 27 (SUTURE) ×2 IMPLANT
SYR 30ML SLIP (SYRINGE) ×2 IMPLANT
SYR TB 1ML LUER SLIP (SYRINGE) ×2 IMPLANT
TOWEL OR 17X24 6PK STRL BLUE (TOWEL DISPOSABLE) ×2 IMPLANT
TOWEL OR 17X26 10 PK STRL BLUE (TOWEL DISPOSABLE) ×8 IMPLANT
TRAY FOLEY CATH 14FR (SET/KITS/TRAYS/PACK) ×2 IMPLANT
WATER STERILE IRR 1000ML POUR (IV SOLUTION) ×6 IMPLANT

## 2011-10-30 NOTE — Transfer of Care (Signed)
Immediate Anesthesia Transfer of Care Note  Patient: [name] Bell  Procedure(s) Performed:  COMPUTER ASSISTED TOTAL KNEE ARTHROPLASTY - right total knee arthroplasty  Patient Location: PACU  Anesthesia Type: General  Level of Consciousness: awake, alert , oriented and patient cooperative  Airway & Oxygen Therapy: Patient Spontanous Breathing and Patient connected to face mask oxygen  Post-op Assessment: Report given to PACU RN, Post -op Vital signs reviewed and stable and Patient moving all extremities  Post vital signs: Reviewed and stable  Complications: No apparent anesthesia complications

## 2011-10-30 NOTE — H&P (View-Only) (Signed)
NAME:  [name] Bell, [name] Bell                ACCOUNT NO.:  [id number]  MEDICAL RECORD NO.:  [id number]  LOCATION:  SDS                          FACILITY:  [facility]  PHYSICIAN:  G. Scott Sandeep Delagarza, M.D.    DATE OF BIRTH:  04/25/1934  DATE OF ADMISSION:  10/23/2011 DATE OF DISCHARGE:                             HISTORY & PHYSICAL   ____  Q#1.1.  [facility] NUMBER:  [id number]  CHIEF COMPLAINT:  Right knee pain.  HISTORY OF PRESENT ILLNESS:  [name] Bell is a 77-year-old patient with right knee pain.  He has incapacitating, debilitating right knee pain that keeps him from walking.  He is using a cane.  He is requiring pain medicine, has night pain, rest pain.  Pain interferes with his activities of daily living.  It is so bad that he cannot work anymore. He is here to discuss surgical intervention when I saw him last, he actually had the food.  Today, he states he is feeling much better.  He has no fever and chills.  He is off all antibiotics.  He states that he has made arrangements with his work in order to take time off.  He is requiring narcotic pain medicine for pain management because other pain management modalities had failed including injection of cortisone and viscosupplementation.  CURRENT MEDICATIONS:  Metformin, lisinopril, simvastatin, AndroGel.  ALLERGIES:  SULFA drugs.  PAST SURGICAL HISTORY:  Bowel and bladder removed, right shoulder surgery, right knee meniscal surgery.  FAMILY HISTORY:  Family medical positive for heart disease.  He is single.  No family history of DVT or pulmonary embolism.  The patient does have support network around town.  He works as a teacher.  REVIEW OF SYSTEMS:  All other systems reviewed and are negative other than related to right knee.  PHYSICAL EXAMINATION:  GENERAL:  He is well developed, well nourished, in no acute distress.  Alert and oriented.  Normal body mass index. CHEST:  Clear to auscultation.  No wheezing.  No rhonchi. HEART:   Heart beats with regular rate and rhythm. ABDOMEN:  Benign. EXTREMITIES:  Right knee demonstrates palpable pedal pulses, slight varus alignment, medial __________ and lateral joint line tenderness, intact extensor mechanisms, stable collateral cruciate ligament.  No other masses, lymphadenopathy, or skin changes noted in the right knee region.  Radiographs showed tricompartmental osteoarthritis with bone-on-bone changes.  IMPRESSION:  Right knee arthritis refractory to nonoperative management.  PLAN:  Right total knee replacement.  Risks and benefits were discussed with the patient including but not limited to infection, nerve, and vessel damage, and complete pain relief, knee stiffness, additional need for revision, the length of rehab was also discussed.  All questions were answered.     G. Scott Shelsea Hangartner, M.D.     GSD/MEDQ  D:  10/23/2011  T:  10/24/2011  Job:  [id number] 

## 2011-10-30 NOTE — Anesthesia Postprocedure Evaluation (Signed)
  Anesthesia Post-op Note  Patient: [name] Bell  Procedure(s) Performed:  COMPUTER ASSISTED TOTAL KNEE ARTHROPLASTY - right total knee arthroplasty  Patient Location: PACU  Anesthesia Type: General  Level of Consciousness: awake, alert  and oriented  Airway and Oxygen Therapy: Patient Spontanous Breathing and Patient connected to nasal cannula oxygen  Post-op Pain: mild  Post-op Assessment: Post-op Vital signs reviewed and Patient's Cardiovascular Status Stable  Post-op Vital Signs: stable  Complications: No apparent anesthesia complications

## 2011-10-30 NOTE — Progress Notes (Signed)
ANTICOAGULATION CONSULT NOTE - Initial Consult  Pharmacy Consult for Coumadin Indication: post op total knee arthroplasty  Allergies  Allergen Reactions  . Sulfa Antibiotics Hives    Patient Measurements:   Heparin Dosing Weight:   Vital Signs: Temp: 97.4 F (36.3 C) (01/22 1700) Temp src: Oral (01/22 1700) BP: 137/81 mmHg (01/22 1700) Pulse Rate: 65  (01/22 1700)  Labs: No results found for this basename: HGB:2,HCT:3,PLT:3,APTT:3,LABPROT:3,INR:3,HEPARINUNFRC:3,CREATININE:3,CKTOTAL:3,CKMB:3,TROPONINI:3 in the last 72 hours CrCl is unknown because there is no height on file for the current visit.  Medical History: Past Medical History  Diagnosis Date  . Allergy     RHINITIS  . Chronic kidney disease     RENAL STONE  . Hypertension   . Hypogonadism male   . Hx of colonic polyps   . FHx: cardiovascular disease   . Seborrheic keratosis   . PONV (postoperative nausea and vomiting)     29 yrs ago; no problems since then  . Diabetes mellitus     type 2 Niddm x 6 yrs  . Asthmatic bronchitis     seasonal  . Arthritis     osteoarthritis  . Cancer     skin cancer removed from back    Medications:  Scheduled:    . Fluticasone-Salmeterol  1 puff Inhalation Q12H  . HYDROmorphone PCA 0.3 mg/mL   Intravenous Q4H  . insulin aspart  0-15 Units Subcutaneous TID WC  . lisinopril  10 mg Oral Daily  . metFORMIN  850 mg Oral BID WC  . methocarbamol(ROBAXIN) IV  500 mg Intravenous To PACU  . multivitamin with minerals  1 tablet Oral Daily  . patient's guide to using coumadin book   Does not apply Once  . pioglitazone  15 mg Oral Daily  . rosuvastatin  20 mg Oral q1800  . vancomycin  1,000 mg Intravenous Once  . vancomycin  1,000 mg Intravenous Q12H  . vitamin B-12  1,000 mcg Oral Daily  . vitamin C  1,000 mg Oral Daily  . warfarin  7.5 mg Oral Once  . warfarin   Does not apply Once  . DISCONTD:  ceFAZolin (ANCEF) IV  2 g Intravenous 60 min Pre-Op  . DISCONTD:  chlorhexidine  60 mL Topical Once  . DISCONTD: chlorhexidine  60 mL Topical Once  . DISCONTD: Testosterone  4 Squirt Transdermal Daily    Assessment: Pt post op total knee arthroplasty. Coumadin for VTE prophylaxis.  Goal of Therapy:  INR 2.0-3.0  Plan: Coumadin 7.5 mg tonight. Daily PT/INR. Ordered coumadin booklet and video for pt.  Eugene Garnet 10/30/2011,9:38 PM

## 2011-10-30 NOTE — Preoperative (Signed)
Beta Blockers   Reason not to administer Beta Blockers:Not Applicable 

## 2011-10-30 NOTE — Transfer of Care (Deleted)
Immediate Anesthesia Transfer of Care Note  Patient: [name] Bell  Procedure(s) Performed:  COMPUTER ASSISTED TOTAL KNEE ARTHROPLASTY - right total knee arthroplasty  Patient Location: PACU  Anesthesia Type: General  Level of Consciousness: awake, alert , oriented and patient cooperative  Airway & Oxygen Therapy: Patient Spontanous Breathing and Patient connected to face mask oxygen  Post-op Assessment: Report given to PACU RN, Post -op Vital signs reviewed and stable and Patient moving all extremities  Post vital signs: Reviewed and stable  Complications: No apparent anesthesia complications 

## 2011-10-30 NOTE — Interval H&P Note (Signed)
History and Physical Interval Note:  10/30/2011 10:45 AM  [name] Bell  has presented today for surgery, with the diagnosis of right knee osteoarthritis  The various methods of treatment have been discussed with the patient and family. After consideration of risks, benefits and other options for treatment, the patient has consented to  Procedure(s): COMPUTER ASSISTED TOTAL KNEE ARTHROPLASTY as a surgical intervention .  The patients' history has been reviewed, patient examined, no change in status, stable for surgery.  I have reviewed the patients' chart and labs.  Questions were answered to the patient's satisfaction.     [name] SCOTT  Pt pos for mrsa will change to vanco preop

## 2011-10-30 NOTE — Brief Op Note (Signed)
10/30/2011  3:38 PM  PATIENT:  [name] Bell  78 y.o. male  PRE-OPERATIVE DIAGNOSIS:  right knee osteoarthritis  POST-OPERATIVE DIAGNOSIS:  right knee osteoarthritis  PROCEDURE:  Procedure(s): COMPUTER ASSISTED TOTAL KNEE ARTHROPLASTY  SURGEON:  Surgeon(s): Cammy Copa, MD  ASSISTANT:vernon*  ANESTHESIA:   general  EBL: 100  ml    Total I/O In: 1350 [I.V.:1350] Out: 300 [Urine:225; Blood:75]  BLOOD ADMINISTERED: none  DRAINS: (knee) Hemovact drain(s) in the knee with  Suction Clamped   LOCAL MEDICATIONS USED:  none  SPECIMEN:  No Specimen  COUNTS:  YES  TOURNIQUET:   Total Tourniquet Time Documented: Thigh (Right) - 120 minutes  DICTATION: .Other Dictation: Dictation Number [id number]  PLAN OF CARE: Admit to inpatient   PATIENT DISPOSITION:  PACU - hemodynamically stable

## 2011-10-30 NOTE — Anesthesia Preprocedure Evaluation (Addendum)
Anesthesia Evaluation  Patient identified by MRN, date of birth, ID band Patient awake    Reviewed: Allergy & Precautions, H&P , NPO status , Patient's Chart, lab work & pertinent test results, reviewed documented beta blocker date and time   History of Anesthesia Complications (+) PONV  Airway Mallampati: II TM Distance: <3 FB Neck ROM: Full    Dental  (+) Teeth Intact   Pulmonary asthma ,  clear to auscultation        Cardiovascular hypertension, Regular Normal    Neuro/Psych    GI/Hepatic   Endo/Other  Diabetes mellitus-, Oral Hypoglycemic Agents  Renal/GU      Musculoskeletal   Abdominal   Peds  Hematology   Anesthesia Other Findings   Reproductive/Obstetrics                          Anesthesia Physical Anesthesia Plan  ASA: III  Anesthesia Plan: General   Post-op Pain Management:    Induction: Intravenous  Airway Management Planned: Oral ETT  Additional Equipment:   Intra-op Plan:   Post-operative Plan: Extubation in OR  Informed Consent: I have reviewed the patients History and Physical, chart, labs and discussed the procedure including the risks, benefits and alternatives for the proposed anesthesia with the patient or authorized representative who has indicated his/her understanding and acceptance.   Dental advisory given  Plan Discussed with:   Anesthesia Plan Comments: (Htn Type 2  DM glucose 121 Obesity GERD H/O postop N/V  Plan GA   Kipp Brood)       Anesthesia Quick Evaluation

## 2011-10-31 ENCOUNTER — Encounter (HOSPITAL_COMMUNITY): Payer: Self-pay | Admitting: Orthopedic Surgery

## 2011-10-31 LAB — BASIC METABOLIC PANEL
BUN: 15 mg/dL (ref 6–23)
CO2: 24 mEq/L (ref 19–32)
Calcium: 8.3 mg/dL — ABNORMAL LOW (ref 8.4–10.5)
Creatinine, Ser: 0.99 mg/dL (ref 0.50–1.35)
Glucose, Bld: 179 mg/dL — ABNORMAL HIGH (ref 70–99)

## 2011-10-31 LAB — CBC
HCT: 34.7 % — ABNORMAL LOW (ref 39.0–52.0)
Hemoglobin: 11.6 g/dL — ABNORMAL LOW (ref 13.0–17.0)
MCH: 31.4 pg (ref 26.0–34.0)
MCV: 94 fL (ref 78.0–100.0)
RBC: 3.69 MIL/uL — ABNORMAL LOW (ref 4.22–5.81)

## 2011-10-31 MED ORDER — VANCOMYCIN HCL IN DEXTROSE 1-5 GM/200ML-% IV SOLN
1000.0000 mg | Freq: Two times a day (BID) | INTRAVENOUS | Status: AC
  Administered 2011-10-31: 1000 mg via INTRAVENOUS
  Filled 2011-10-31: qty 200

## 2011-10-31 MED ORDER — WARFARIN SODIUM 6 MG PO TABS
6.0000 mg | ORAL_TABLET | Freq: Once | ORAL | Status: AC
  Administered 2011-10-31: 6 mg via ORAL
  Filled 2011-10-31 (×2): qty 1

## 2011-10-31 NOTE — Progress Notes (Signed)
Physical Therapy Treatment Patient Details Name: [name] Bell MRN: [id number] DOB: [birth year] Today's Date: 10/31/2011  PT Assessment/Plan  PT - Assessment/Plan Comments on Treatment Session: Pt admitted s/p right TKA and is progressing well.  Pt tolerated ambulation BID as well as exercises.  Left pt in CPM after treatment 0-30 degrees.  Will folllow. PT Plan: Frequency remains appropriate;Discharge plan needs to be updated PT Frequency: 7X/week Follow Up Recommendations: Skilled nursing facility (Pt reports there will be noone to assist at home after all.) Equipment Recommended: Defer to next venue PT Goals  Acute Rehab PT Goals PT Goal Formulation: With patient Time For Goal Achievement: 7 days PT Goal: Sit to Supine/Side - Progress: Progressing toward goal PT Goal: Sit to Stand - Progress: Progressing toward goal PT Goal: Stand to Sit - Progress: Progressing toward goal PT Goal: Ambulate - Progress: Progressing toward goal  PT Treatment Precautions/Restrictions  Precautions Precautions: Knee Required Braces or Orthoses: Yes Knee Immobilizer: On at all times (Right LE.) Restrictions Weight Bearing Restrictions: Yes RLE Weight Bearing: Weight bearing as tolerated Pain 5/10 in right knee.  Pt repositioned. Mobility (including Balance) Bed Mobility Bed Mobility: Yes Supine to Sit: Not tested (comment) Sit to Supine: 4: Min assist;HOB flat Sit to Supine - Details (indicate cue type and reason): Assist for right LE due to pain.  Cues for sequence. Transfers Transfers: Yes Sit to Stand: 4: Min assist;With upper extremity assist;From chair/3-in-1 (2 trials.) Sit to Stand Details (indicate cue type and reason): Assist for balance and to off weight right LE due to pain.  Cues for hand placement. Stand to Sit: 4: Min assist;With upper extremity assist;To chair/3-in-1;To bed (2 trials.) Stand to Sit Details: Assist to slow descent to chair with cues for hand and right LE  placement. Ambulation/Gait Ambulation/Gait: Yes Ambulation/Gait Assistance: 4: Min assist (Min (guard)) Ambulation/Gait Assistance Details (indicate cue type and reason): Guarding for balance only with cues for sequence. Ambulation Distance (Feet): 40 Feet Assistive device: Rolling walker Gait Pattern: Step-to pattern;Decreased step length - right;Decreased stance time - right;Trunk flexed Stairs: No Wheelchair Mobility Wheelchair Mobility: No  Posture/Postural Control Posture/Postural Control: No significant limitations Balance Balance Assessed: No End of Session PT - End of Session Equipment Utilized During Treatment: Gait belt;Right knee immobilizer Activity Tolerance: Patient tolerated treatment well Patient left: in bed;with call bell in reach Nurse Communication: Mobility status for transfers;Mobility status for ambulation General Behavior During Session: [facility] for tasks performed Cognition: [facility] for tasks performed  [name] 10/31/2011, 3:23 PM  10/31/2011 [name], PT, DPT [phone]

## 2011-10-31 NOTE — Progress Notes (Signed)
Physical Therapy Evaluation Patient Details Name: [name] Bell MRN: [id number] DOB: [birth year] Today's Date: 10/31/2011  Problem List:  Patient Active Problem List  Diagnoses  . Arthritis of knee, right    Past Medical History:  Past Medical History  Diagnosis Date  . Allergy     RHINITIS  . Chronic kidney disease     RENAL STONE  . Hypertension   . Hypogonadism male   . Hx of colonic polyps   . FHx: cardiovascular disease   . Seborrheic keratosis   . PONV (postoperative nausea and vomiting)     29 yrs ago; no problems since then  . Diabetes mellitus     type 2 Niddm x 6 yrs  . Asthmatic bronchitis     seasonal  . Arthritis     osteoarthritis  . Cancer     skin cancer removed from back   Past Surgical History:  Past Surgical History  Procedure Date  . Cholecystectomy 2010    GALL BLADDER  . Knee cartilage surgery 2009    Right knee  . Hernia repair     during cholecystectomy  . Knee arthroplasty 10/30/2011    Procedure: COMPUTER ASSISTED TOTAL KNEE ARTHROPLASTY;  Surgeon: Cammy Copa, MD;  Location: [facility] OR;  Service: Orthopedics;  Laterality: Right;  right total knee arthroplasty    PT Assessment/Plan/Recommendation PT Assessment Clinical Impression Statement: Pt is a 78 y/o male admitted s/p right TKA along with the below PT problem list.  Pt would benefit from acute PT to maximize independence and facilitate d/c home with HHPT. PT Recommendation/Assessment: Patient will need skilled PT in the acute care venue PT Problem List: Decreased strength;Decreased range of motion;Decreased activity tolerance;Decreased balance;Decreased mobility;Decreased knowledge of use of DME;Decreased knowledge of precautions;Pain Barriers to Discharge: None PT Therapy Diagnosis : Difficulty walking;Acute pain PT Plan PT Frequency: 7X/week PT Treatment/Interventions: DME instruction;Gait training;Stair training;Functional mobility training;Therapeutic activities;Balance  training;Therapeutic exercise;Patient/family education PT Recommendation Follow Up Recommendations: Home health PT Equipment Recommended: None recommended by PT PT Goals  Acute Rehab PT Goals PT Goal Formulation: With patient Time For Goal Achievement: 7 days Pt will go Supine/Side to Sit: with modified independence PT Goal: Supine/Side to Sit - Progress: Goal set today Pt will go Sit to Supine/Side: with modified independence PT Goal: Sit to Supine/Side - Progress: Goal set today Pt will go Sit to Stand: with modified independence PT Goal: Sit to Stand - Progress: Goal set today Pt will go Stand to Sit: with modified independence PT Goal: Stand to Sit - Progress: Goal set today Pt will Ambulate: >150 feet;with modified independence;with least restrictive assistive device PT Goal: Ambulate - Progress: Goal set today Pt will Go Up / Down Stairs: 1-2 stairs;with min assist;with least restrictive assistive device PT Goal: Up/Down Stairs - Progress: Goal set today Pt will Perform Home Exercise Program: Independently PT Goal: Perform Home Exercise Program - Progress: Goal set today  PT Evaluation Precautions/Restrictions  Precautions Precautions: Knee Required Braces or Orthoses: Yes Knee Immobilizer: On at all times (Right LE.) Restrictions Weight Bearing Restrictions: Yes RLE Weight Bearing: Weight bearing as tolerated Prior Functioning  Home Living Lives With: Other (Comment) (Roommate) Receives Help From:  (Roommate and friend will help at d/c.) Type of Home: House Home Layout: Able to live on main level with bedroom/bathroom;Two level Alternate Level Stairs-Rails:  (Will not utilize) Home Access: Stairs to enter Entrance Stairs-Rails: None Entrance Stairs-Number of Steps: 2 Home Adaptive Equipment: Walker - rolling Prior Function Level  of Independence: Independent with basic ADLs;Independent with homemaking with ambulation;Independent with gait;Independent with  transfers Able to Take Stairs?: Yes Driving: Yes Vocation: Full time employment 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 Cognition Arousal/Alertness: Awake/alert Overall Cognitive Status: Appears within functional limits for tasks assessed Orientation Level: Oriented X4 Sensation/Coordination Sensation Light Touch: Appears Intact Stereognosis: Not tested Hot/Cold: Not tested Proprioception: Not tested Coordination Gross Motor Movements are Fluid and Coordinated: Yes Fine Motor Movements are Fluid and Coordinated: Yes Extremity Assessment RUE Assessment RUE Assessment: Within Functional Limits LUE Assessment LUE Assessment: Within Functional Limits RLE Assessment RLE Assessment: Exceptions to [facility] RLE AROM (degrees) Right Knee Extension 0-130: 5  Right Knee Flexion 0-140: 45  RLE Strength RLE Overall Strength: Deficits;Due to pain RLE Overall Strength Comments: 2/5 LLE Assessment LLE Assessment: Within Functional Limits Pain 5/10 in right knee.  Pt repositioned after treatment. Mobility (including Balance) Bed Mobility Bed Mobility: Yes Supine to Sit: 3: Mod assist;HOB flat;With rails Supine to Sit Details (indicate cue type and reason): Assist for right LE due to pain and trunk to translate anterior over BOS.  Cues for sequence. Transfers Transfers: Yes Sit to Stand: 4: Min assist;With upper extremity assist;From bed;From chair/3-in-1 (2 trials.) Sit to Stand Details (indicate cue type and reason): Assist for balance due to pain and to off weight right LE with stance.  Cues for hand placement. Stand to Sit: 4: Min assist;With upper extremity assist;To chair/3-in-1 (2 trials.) Stand to Sit Details: Assist to slow descent to chair with cues for hand/right LE placement. Ambulation/Gait Ambulation/Gait: Yes Ambulation/Gait Assistance: 4: Min assist Ambulation/Gait Assistance Details (indicate cue type and reason): Assist to off weight right LE with stance due  to pain with cues for sequence/safety inside RW. Ambulation Distance (Feet): 30 Feet Assistive device: Rolling walker Gait Pattern: Step-to pattern;Decreased step length - right;Decreased stance time - right;Trunk flexed Stairs: No Wheelchair Mobility Wheelchair Mobility: No  Posture/Postural Control Posture/Postural Control: No significant limitations Balance Balance Assessed: No Exercise  Total Joint Exercises Ankle Circles/Pumps: AROM;Right;10 reps;Supine Quad Sets: AROM;Right;10 reps;Supine Heel Slides: AAROM;Right;10 reps;Supine End of Session PT - End of Session Equipment Utilized During Treatment: Gait belt;Right knee immobilizer Activity Tolerance: Patient tolerated treatment well Patient left: in chair;with call bell in reach Nurse Communication: Mobility status for transfers;Mobility status for ambulation General Behavior During Session: [facility] for tasks performed Cognition: [facility] for tasks performed  [name] 10/31/2011, 1:01 PM  10/31/2011 [name], PT, DPT [phone]

## 2011-10-31 NOTE — Progress Notes (Signed)
UR COMPLETED  

## 2011-10-31 NOTE — Progress Notes (Signed)
CARE MANAGEMENT NOTE 10/31/2011 Discharge planning. Spoke with patient. Choice offered, preoperatively setup with [facility], no changes made. Rolling walker, 3in1 and CPM have been delivered to his home.

## 2011-10-31 NOTE — Progress Notes (Signed)
ANTICOAGULATION CONSULT NOTE - Follow Up  Pharmacy Consult for Coumadin Indication: post op total knee arthroplasty VTE prophylaxis  Allergies  Allergen Reactions  . Sulfa Antibiotics Hives    Patient Measurements: Height: 5\' 5"  (165.1 cm) (from preadmission 10/23/11) Weight: 192 lb 1.6 oz (87.136 kg) (from preadmission 10/23/11) IBW/kg (Calculated) : 61.5    Vital Signs: Temp: 97.8 F (36.6 C) (01/23 0609) BP: 131/73 mmHg (01/23 0609) Pulse Rate: 73  (01/23 0609)  Labs:  [name] Bell 10/31/11 0625  HGB 11.6*  HCT 34.7*  PLT 240  APTT --  LABPROT 14.4  INR 1.10  HEPARINUNFRC --  CREATININE 0.99  CKTOTAL --  CKMB --  TROPONINI --   Estimated Creatinine Clearance: 65.4 ml/min (by C-G formula based on Cr of 0.99).  Medical History: Past Medical History  Diagnosis Date  . Allergy     RHINITIS  . Chronic kidney disease     RENAL STONE  . Hypertension   . Hypogonadism male   . Hx of colonic polyps   . FHx: cardiovascular disease   . Seborrheic keratosis   . PONV (postoperative nausea and vomiting)     29 yrs ago; no problems since then  . Diabetes mellitus     type 2 Niddm x 6 yrs  . Asthmatic bronchitis     seasonal  . Arthritis     osteoarthritis  . Cancer     skin cancer removed from back    Medications:  Scheduled:     . Fluticasone-Salmeterol  1 puff Inhalation Q12H  . HYDROmorphone PCA 0.3 mg/mL   Intravenous Q4H  . insulin aspart  0-15 Units Subcutaneous TID WC  . lisinopril  10 mg Oral Daily  . metFORMIN  850 mg Oral BID WC  . methocarbamol(ROBAXIN) IV  500 mg Intravenous To PACU  . multivitamin with minerals  1 tablet Oral Daily  . patient's guide to using coumadin book   Does not apply Once  . pioglitazone  15 mg Oral Daily  . rosuvastatin  20 mg Oral q1800  . vancomycin  1,000 mg Intravenous Q12H  . vitamin B-12  1,000 mcg Oral Daily  . vitamin C  1,000 mg Oral Daily  . warfarin  7.5 mg Oral Once  . warfarin   Does not apply Once  .  DISCONTD: chlorhexidine  60 mL Topical Once  . DISCONTD: chlorhexidine  60 mL Topical Once  . DISCONTD: Testosterone  4 Squirt Transdermal Daily  . DISCONTD: vancomycin  1,000 mg Intravenous Q12H    Assessment: Pt POD#1 s/p  Right TKA. Started on Coumadin 10/30/11 for VTE prophylaxis. INR 1.1 today.  CBC decreased, hgb 11.6, Hct 34.7, pltc 240K. No bleeding reported. SCDs   Goal of Therapy:  INR 2.0-3.0  Plan: Coumadin 6 mg tonight. Daily PT/INR.   Arman Filter 10/31/2011,12:36 PM

## 2011-10-31 NOTE — Progress Notes (Signed)
Occupational Therapy Evaluation Patient Details Name: [name] Bell MRN: [id number] DOB: May 27, 1934 Today's Date: 10/31/2011  Problem List:  Patient Active Problem List  Diagnoses  . Arthritis of knee, right    Past Medical History:  Past Medical History  Diagnosis Date  . Allergy     RHINITIS  . Chronic kidney disease     RENAL STONE  . Hypertension   . Hypogonadism male   . Hx of colonic polyps   . FHx: cardiovascular disease   . Seborrheic keratosis   . PONV (postoperative nausea and vomiting)     29 yrs ago; no problems since then  . Asthmatic bronchitis     seasonal  . Arthritis     osteoarthritis  . Cancer     skin cancer removed from back  . Diabetes mellitus     type 2 Niddm x 6 yrs   Past Surgical History:  Past Surgical History  Procedure Date  . Cholecystectomy 2010    GALL BLADDER  . Knee cartilage surgery 2009    Right knee  . Hernia repair     during cholecystectomy  . Knee arthroplasty 10/30/2011    Procedure: COMPUTER ASSISTED TOTAL KNEE ARTHROPLASTY;  Surgeon: Cammy Copa, MD;  Location: [facility] OR;  Service: Orthopedics;  Laterality: Right;  right total knee arthroplasty  . Right total knee 01/22.2013  . Right shoulder     OT Assessment/Plan/Recommendation OT Assessment Clinical Impression Statement: Pt 78 yo s/p R TKA. Immobilizer used when walking. WBAT. Pt will benefit from skilled OT services to max indep with ADL and functional mobility for ADL @ RW level to reach below established goals and facilitate safe D/C. Feel that if pt continues to progress well, that he will be able to go home with [facility] services. Pt will have family and friends to help initially after D/C. Pt has companion at his house every night. OT Recommendation/Assessment: Patient will need skilled OT in the acute care venue OT Problem List: Decreased strength;Decreased knowledge of use of DME or AE;Decreased knowledge of precautions;Pain OT Therapy Diagnosis : Generalized  weakness OT Plan OT Frequency: Min 2X/week OT Treatment/Interventions: Self-care/ADL training;Therapeutic exercise;Energy conservation;DME and/or AE instruction;Therapeutic activities;Patient/family education OT Recommendation Follow Up Recommendations: Home health OT (pending progress) Equipment Recommended: Tub/shower bench Individuals Consulted Consulted and Agree with Results and Recommendations: Patient OT Goals Acute Rehab OT Goals OT Goal Formulation: With patient Time For Goal Achievement: 2 weeks ADL Goals Pt Will Perform Lower Body Bathing: with modified independence;Sit to stand from chair;with adaptive equipment ADL Goal: Lower Body Bathing - Progress: Goal set today Pt Will Perform Upper Body Dressing: with modified independence (Pt will perform LB dressing @ Mod I level with AE) Pt Will Transfer to Toilet: with modified independence;3-in-1;Maintaining weight bearing status;with DME;Ambulation ADL Goal: Toilet Transfer - Progress: Goal set today Pt Will Perform Toileting - Clothing Manipulation: Independently ADL Goal: Toileting - Clothing Manipulation - Progress: Goal set today Pt Will Perform Tub/Shower Transfer: with supervision;with DME;Ambulation;Transfer tub bench ADL Goal: Tub/Shower Transfer - Progress: Goal set today  OT Evaluation Precautions/Restrictions  Precautions Precautions: Knee Required Braces or Orthoses: Yes Knee Immobilizer: On at all times (Right LE.) Restrictions Weight Bearing Restrictions: Yes RLE Weight Bearing: Weight bearing as tolerated Prior Functioning Home Living Lives With: Other (Comment) Receives Help From: Family Type of Home: House Home Layout: Able to live on main level with bedroom/bathroom;Two level Alternate Level Stairs-Rails:  (Will not utilize) Home Access: Stairs to enter Entrance Stairs-Rails:  None Entrance Stairs-Number of Steps: 2 Bathroom Shower/Tub: Engineer, manufacturing systems: Standard Bathroom  Accessibility: Yes How Accessible: Accessible via walker Home Adaptive Equipment: Walker - rolling Prior Function Level of Independence: Independent with basic ADLs;Independent with homemaking with ambulation;Independent with gait;Independent with transfers Able to Take Stairs?: Yes Driving: Yes Vocation: Full time employment 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 Eating/Feeding: Independent Where Assessed - Eating/Feeding: Chair Grooming: Set up;Performed Where Assessed - Grooming: Sitting, chair Upper Body Bathing: Simulated;Set up Where Assessed - Upper Body Bathing: Sitting, chair Lower Body Bathing: Moderate assistance;Simulated Where Assessed - Lower Body Bathing: Sit to stand from chair Upper Body Dressing: Simulated;Set up Where Assessed - Upper Body Dressing: Sitting, chair Lower Body Dressing: Moderate assistance;Performed Where Assessed - Lower Body Dressing: Sit to stand from chair Toilet Transfer: Simulated;Moderate assistance Toilet Transfer Method: Ambulating;Stand pivot Acupuncturist: Bedside commode Toileting - Clothing Manipulation: Modified independent Where Assessed - Toileting Clothing Manipulation: Sit to stand from 3-in-1 or toilet Toileting - Hygiene: Independent Where Assessed - Toileting Hygiene: Standing Tub/Shower Transfer: Not assessed (discussed potential need for tub bench) Tub/Shower Transfer Method: Not assessed Equipment Used: Rolling walker;Sock aid;Reacher;Long-handled sponge Ambulation Related to ADLs: Min A ADL Comments: completing UB ADL with set up. Pt will benefit from use of AE for LB ADL.  Vision/Perception  Vision - History Baseline Vision: Wears glasses all the time Patient Visual Report: No change from baseline Perception Perception: Within Functional Limits Praxis Praxis: Intact Cognition Cognition Arousal/Alertness: Awake/alert Overall Cognitive Status: Appears within functional limits for tasks  assessed Orientation Level: Oriented X4 Sensation/Coordination Sensation Light Touch: Appears Intact Stereognosis: Not tested Hot/Cold: Not tested Proprioception: Not tested Coordination Gross Motor Movements are Fluid and Coordinated: Yes Fine Motor Movements are Fluid and Coordinated: Yes Extremity Assessment RUE Assessment RUE Assessment: Within Functional Limits LUE Assessment LUE Assessment: Within Functional Limits Mobility  Bed Mobility Bed Mobility: No Supine to Sit: 3: Mod assist;HOB flat;With rails Supine to Sit Details (indicate cue type and reason): Assist for right LE due to pain and trunk to translate anterior over BOS.  Cues for sequence. Transfers Transfers: Yes Sit to Stand: 4: Min assist;With upper extremity assist;From bed;From chair/3-in-1 Sit to Stand Details (indicate cue type and reason): Assist for balance due to pain and to off weight right LE with stance.  Cues for hand placement. Stand to Sit: 4: Min assist;With upper extremity assist;To chair/3-in-1 Stand to Sit Details: Assist to slow descent to chair with cues for hand/right LE placement. End of Session OT - End of Session Equipment Utilized During Treatment: Gait belt;Right knee immobilizer Activity Tolerance: Patient limited by pain (limited somewhat by fear of putting weight via RLE) Patient left: in chair;with call bell in reach General Behavior During Session: [facility] for tasks performed Cognition: [facility] for tasks performed   Rayah Fines,HILLARY 10/31/2011, 2:09 PM  [facility], OTR/L  [phone] 10/31/2011

## 2011-10-31 NOTE — Progress Notes (Signed)
Subjective: i spent 10 hrs on my cpm   Objective: Vital signs in last 24 hours: Temp:  [96.5 F (35.8 C)-98.9 F (37.2 C)] 97.8 F (36.6 C) (01/23 0609) Pulse Rate:  [62-101] 73  (01/23 0609) Resp:  [11-22] 19  (01/23 0609) BP: (125-167)/(73-96) 131/73 mmHg (01/23 0609) SpO2:  [94 %-99 %] 94 % (01/23 0609)  Intake/Output from previous day: 01/22 0701 - 01/23 0700 In: 2450 [I.V.:2250; IV Piggyback:200] Out: 1450 [Urine:1175; Drains:200; Blood:75] Intake/Output this shift:    Exam:  Neurovascular intact Sensation intact distally Intact pulses distally Dorsiflexion/Plantar flexion intact  Labs:  Basename 10/31/11 0625  HGB 11.6*    Basename 10/31/11 0625  WBC 9.9  RBC 3.69*  HCT 34.7*  PLT 240    Basename 10/31/11 0625  NA 136  K 4.4  CL 103  CO2 24  BUN 15  CREATININE 0.99  GLUCOSE 179*  CALCIUM 8.3*    Basename 10/31/11 0625  LABPT --  INR 1.10    Assessment/Plan: Pt stable and motivated - PT today - poss dc this weekend   DEAN,GREGORY SCOTT 10/31/2011, 7:34 AM

## 2011-10-31 NOTE — Op Note (Signed)
NAMETENZIN, EDELMAN NO.:  [id number]  MEDICAL RECORD NO.:  [id number]  LOCATION:  5016                         FACILITY:  [facility]  PHYSICIAN:  Burnard Bunting, M.D.    DATE OF BIRTH:  [birth year]  DATE OF PROCEDURE:  10/30/2011 DATE OF DISCHARGE:                              OPERATIVE REPORT   PREOPERATIVE DIAGNOSIS:  Right knee arthritis.  POSTOPERATIVE DIAGNOSIS:  Right knee arthritis.  PROCEDURE:  Right total knee replacement using DePuy posterior cruciate sacrificing rotating platform, size 3 femur, 3 tibia, 12.5 poly, 35 patella.  SURGEON:  Burnard Bunting, MD  ASSISTANT:  Wende Neighbors, P.A.  ANESTHESIA:  General.  ESTIMATED BLOOD LOSS:  100 mL.  DRAINS:  None.  TOURNIQUET TIME:  120 minutes at 300 mmHg.  INDICATIONS:  [name] Bell is a 78 year old patient, right knee arthritis, presents for operative management after explanation of risks and benefits.  PROCEDURE IN DETAIL:  The patient was brought to the operating room, where general endotracheal anesthesia was induced.  Preop antibiotics administered.  Time-out was called.  Right leg was pre scrubbed with alcohol and Betadine, which allowed to air dry, prepped with DuraPrep solution and draped in a sterile manner.  The [name] was used to cover the operative field.  Leg was elevated and exsanguinated with Esmarch wrap.  Tourniquet was inflated.  Anterior approach to knee was made. Skin and subcutaneous tissue sharply divided.  A median parapatellar approach was made.  Precise location was marked with #1 Vicryl suture. Patella was everted.  Fat pad was partially excised.  Soft tissue was elevated medially.  Lateral patellofemoral ligament was released.  Soft tissue was also removed from the anterior distal portion of the femur. At this time, two pins were placed in the proximal medial tibia and distal medial femur.  Computer guidance was then used to obtain hip center rotation, bimalleolar  axis, and various points about the knee. Initial cut on the tibia were made 11.2 off the high, 0 off the low. This was made perpendicular to the mechanical axis.  Subsequent cut was made off the distal femur, which was a 13 mm cut.  Box cut, chamfer cuts were then made.  Tibia was keel punched.  This was all size 3 femur and 3 tibia, with trials in place.  A 15 degree flexion contracture remained even after the box cut and with elevation of posterior tissues.  At this time, re-cuts were made on both the tibia and the femur.  The re-cut on the tibia gave a total resection of 14 mm, re-cut on the tibia gave an actual cut of 16 on the distal femur.  With these cuts in position, trial components were placed.  The patient did achieve full extension with a  12.5 poly, which had excellent flexion and extension with excellent alignment.  The patella was prepared, 24 mm down to 14 with a 35 patella placed with trial components in position including 12.5 poly. The patient had excellent extension and excellent flexion with excellent patellar tracking using no thumbs technique.  Knee was stable to varus valgus stress at 0 and 30 degrees.  Trial components were removed.  True components were placed.  Tourniquet was released.  Bleeding points were encountered, were controlled with electrocautery.  Same stability parameters were maintained.  Incision was then closed over bolster using interrupted inverted #1 Vicryl suture, 0 Vicryl suture, 2-0 Vicryl suture, and skin staples.  Skin incisions were irrigated, and closed using 3-0 nylon suture.  Solution of Marcaine, morphine, and clonidine injected to the knee.  Bulky dressing was applied.  The patient tolerated procedure well without immediate complications, transferred to recovery room in stable condition.  [name]'s assistance was required all times during the case for retraction for neurovascular structures, drilling, opening, closing, her  assistance was medical necessity.     Burnard Bunting, M.D.     GSD/MEDQ  D:  10/30/2011  T:  10/31/2011  Job:  [id number]

## 2011-11-01 LAB — CBC
Hemoglobin: 11.3 g/dL — ABNORMAL LOW (ref 13.0–17.0)
Platelets: 232 10*3/uL (ref 150–400)
RBC: 3.6 MIL/uL — ABNORMAL LOW (ref 4.22–5.81)

## 2011-11-01 LAB — GLUCOSE, CAPILLARY
Glucose-Capillary: 192 mg/dL — ABNORMAL HIGH (ref 70–99)
Glucose-Capillary: 184 mg/dL — ABNORMAL HIGH (ref 70–99)
Glucose-Capillary: 149 mg/dL — ABNORMAL HIGH (ref 70–99)
Glucose-Capillary: 199 mg/dL — ABNORMAL HIGH (ref 70–99)

## 2011-11-01 LAB — PROTIME-INR
INR: 1.43 (ref 0.00–1.49)
Prothrombin Time: 17.7 seconds — ABNORMAL HIGH (ref 11.6–15.2)

## 2011-11-01 MED ORDER — CHLORPROMAZINE HCL 25 MG PO TABS
25.0000 mg | ORAL_TABLET | Freq: Four times a day (QID) | ORAL | Status: DC
  Administered 2011-11-01 (×4): 25 mg via ORAL
  Filled 2011-11-01 (×7): qty 1

## 2011-11-01 MED ORDER — WARFARIN SODIUM 6 MG PO TABS
6.0000 mg | ORAL_TABLET | Freq: Once | ORAL | Status: AC
  Administered 2011-11-01: 6 mg via ORAL
  Filled 2011-11-01: qty 1

## 2011-11-01 MED ORDER — MAGNESIUM CITRATE PO SOLN
1.0000 | Freq: Once | ORAL | Status: AC
  Administered 2011-11-01: 1 via ORAL
  Filled 2011-11-01: qty 296

## 2011-11-01 NOTE — Progress Notes (Signed)
Orthopedic Tech Progress Note Patient Details:  [name] Bell [birth year] [id number]     Knee immobilizer [name], [name] 11/01/2011, 2:50 PM

## 2011-11-01 NOTE — ED Notes (Signed)
Spoke with pt's partner, Molly Maduro, re: NHP for pt.  Pt sleeping soundly after oxy and thorazine.  Molly Maduro believes that pt has decided on ST SNF placement at d/c, as he will not have 24 hour care at home.  Service line CSW will speak with pt and verify plan and pursue placement as appropriate.

## 2011-11-01 NOTE — Progress Notes (Signed)
Physical Therapy Treatment Patient Details Name: [name] Bell MRN: [id number] DOB: 1934/06/01 Today's Date: 11/01/2011  PT Assessment/Plan  PT - Assessment/Plan Comments on Treatment Session: Pt admitted s/p right TKA and is very motivated to progress.  Pt making great progess with ambulation as well as exercises. PT Plan: Frequency remains appropriate;Discharge plan remains appropriate PT Frequency: 7X/week Follow Up Recommendations: Skilled nursing facility Equipment Recommended: Defer to next venue PT Goals  Acute Rehab PT Goals PT Goal Formulation: With patient Time For Goal Achievement: 7 days PT Goal: Sit to Stand - Progress: Progressing toward goal PT Goal: Stand to Sit - Progress: Progressing toward goal PT Goal: Ambulate - Progress: Progressing toward goal PT Goal: Perform Home Exercise Program - Progress: Progressing toward goal  PT Treatment Precautions/Restrictions  Precautions Precautions: Knee Precaution Booklet Issued: No Required Braces or Orthoses: Yes Knee Immobilizer: On at all times (Right LE.) Restrictions Weight Bearing Restrictions: Yes RLE Weight Bearing: Weight bearing as tolerated Pain 2/10 in right knee.  Pt repositioned. Mobility (including Balance) Bed Mobility Bed Mobility: No Supine to Sit: Not tested (comment) Sit to Supine: Not Tested (comment) Transfers Transfers: Yes Sit to Stand: 4: Min assist;With upper extremity assist;From chair/3-in-1 Sit to Stand Details (indicate cue type and reason): Assist to off weight right LE due to pain with cues for hand placement. Stand to Sit: 4: Min assist;With upper extremity assist;To chair/3-in-1 Stand to Sit Details: Assist to slow descent to surface.  Cues for hand and right LE placement. Ambulation/Gait Ambulation/Gait: Yes Ambulation/Gait Assistance: 4: Min assist Ambulation/Gait Assistance Details (indicate cue type and reason): Assist to off weight right LE due to pain with cues for  sequence.   Ambulation Distance (Feet): 100 Feet Assistive device: Rolling walker Gait Pattern: Step-to pattern;Decreased step length - right;Decreased stance time - right;Trunk flexed Stairs: No Wheelchair Mobility Wheelchair Mobility: No  Posture/Postural Control Posture/Postural Control: No significant limitations Balance Balance Assessed: No Exercise  Total Joint Exercises Ankle Circles/Pumps: AROM;Right;10 reps;Supine Quad Sets: AROM;Right;10 reps;Supine Short Arc Quad: AROM;Right;10 reps;Supine Heel Slides: AAROM;Right;10 reps;Supine Hip ABduction/ADduction: AAROM;Right;10 reps;Supine Straight Leg Raises: AAROM;Right;10 reps;Supine End of Session PT - End of Session Equipment Utilized During Treatment: Gait belt;Right knee immobilizer Activity Tolerance: Patient tolerated treatment well Patient left: in chair;with call bell in reach Nurse Communication: Mobility status for transfers;Mobility status for ambulation General Behavior During Session: [facility] for tasks performed Cognition: [facility] for tasks performed  [name] 11/01/2011, 1:16 PM  11/01/2011 [name], PT, DPT [phone]

## 2011-11-01 NOTE — Progress Notes (Signed)
ANTICOAGULATION CONSULT NOTE - Follow Up  Pharmacy Consult for Coumadin Indication: post op total knee arthroplasty VTE prophylaxis  Allergies  Allergen Reactions  . Sulfa Antibiotics Hives    Patient Measurements: Height: 5\' 5"  (165.1 cm) (from preadmission 10/23/11) Weight: 192 lb 1.6 oz (87.136 kg) (from preadmission 10/23/11) IBW/kg (Calculated) : 61.5    Vital Signs: Temp: 98.3 F (36.8 C) (01/24 0659) BP: 138/73 mmHg (01/24 0659) Pulse Rate: 81  (01/24 0659)  Labs:  Basename 11/01/11 0540 10/31/11 0625  HGB 11.3* 11.6*  HCT 33.6* 34.7*  PLT 232 240  APTT -- --  LABPROT 17.7* 14.4  INR 1.43 1.10  HEPARINUNFRC -- --  CREATININE -- 0.99  CKTOTAL -- --  CKMB -- --  TROPONINI -- --   Estimated Creatinine Clearance: 65.4 ml/min (by C-G formula based on Cr of 0.99).  Medical History: Past Medical History  Diagnosis Date  . Allergy     RHINITIS  . Chronic kidney disease     RENAL STONE  . Hypertension   . Hypogonadism male   . Hx of colonic polyps   . FHx: cardiovascular disease   . Seborrheic keratosis   . PONV (postoperative nausea and vomiting)     29 yrs ago; no problems since then  . Asthmatic bronchitis     seasonal  . Arthritis     osteoarthritis  . Cancer     skin cancer removed from back  . Diabetes mellitus     type 2 Niddm x 6 yrs    Admit Complaint:Pt POD#1 s/p  Right TKA. Pharmacist System-Based Medication Review: Anticoagulation Started on Coumadin 10/30/11 for VTE prophylaxis s/p R TKR. INR below goal, but rising s/p 2 doses. H/H and plts stable.  Infectious Disease WBC WNL, pt afebrile, no abx on board.  Cardiovascular VSS, cont on lisinopril, crestor  Endocrinology resumed metformin, Actos ssi; CBGs 155-161  Gastrointestinal / Nutrition CHO modified diet; Vit C & B-12 resumedVit C & B-12 resumed  Nephrology Scr stable, lytes good. UOP good.  Pulmonary O2 94% RA; resp reg/clear; on Advair  Hematology / Oncology CBC stable, no overt  bleeding reported  Best Practices Coumadin vte px /SCDS; poss DC this weekend.   Goal of Therapy:  INR 2.0-3.0  Plan: 1. Repeat Coumadin 6mg  again today 2. Daily INR  Krystel Fletchall K. Allena Katz, PharmD, BCPS.  Clinical Pharmacist Pager [phone]. 11/01/2011 12:33 PM

## 2011-11-01 NOTE — Progress Notes (Signed)
Physical Therapy Treatment Patient Details Name: [name] Bell MRN: [id number] DOB: 1934/01/07 Today's Date: 11/01/2011  PT Assessment/Plan  PT - Assessment/Plan Comments on Treatment Session: Pt's goal was to walk to the bathroom, but quickly realized he could not make it due to fatique and pain, changed to transfer to [facility]. PT Plan: Discharge plan remains appropriate  Precautions/Restrictions  Precautions Precautions: Knee;Fall Precaution Booklet Issued: No Required Braces or Orthoses: Yes Knee Immobilizer: On at all times (Right LE.) Restrictions Weight Bearing Restrictions: Yes RLE Weight Bearing: Weight bearing as tolerated Mobility (including Balance) Transfers Sit to Stand: 4: Min assist Stand to Sit: 3: Mod assist Stand to Sit Details: Onto bedside commode    Exercise   Pt performed ankle pumps and quad sets x 10 reps each.  AAROM/PROM heel slides x 10 reps with approx. 15 degrees of knee flexion.  End of Session PT - End of Session Equipment Utilized During Treatment: Gait belt;Left knee immobilizer Activity Tolerance: Patient limited by pain;Patient limited by fatigue Patient left:  (on [facility]) Nurse Communication: Mobility status for transfers General Behavior During Session: [facility] for tasks performed Cognition: [facility] for tasks performed  [name] Bell 11/01/2011, 3:28 PM

## 2011-11-01 NOTE — Progress Notes (Signed)
Subjective: Pt stable - had a hard day yesterday   Objective: Vital signs in last 24 hours: Temp:  [98.3 F (36.8 C)-98.7 F (37.1 C)] 98.3 F (36.8 C) (01/24 0659) Pulse Rate:  [81-88] 81  (01/24 0659) Resp:  [18-20] 20  (01/24 0747) BP: (138-154)/(73-79) 138/73 mmHg (01/24 0659) SpO2:  [90 %-98 %] 98 % (01/24 0822) FiO2 (%):  [90 %] 90 % (01/23 2013) Weight:  [87.136 kg (192 lb 1.6 oz)] 87.136 kg (192 lb 1.6 oz) (01/23 1157)  Intake/Output from previous day: 01/23 0701 - 01/24 0700 In: 500 [P.O.:500] Out: 3575 [Urine:3575] Intake/Output this shift:    Exam:  Sensation intact distally Intact pulses distally Dorsiflexion/Plantar flexion intact  Labs:  Basename 11/01/11 0540 10/31/11 0625  HGB 11.3* 11.6*    Basename 11/01/11 0540 10/31/11 0625  WBC 10.6* 9.9  RBC 3.60* 3.69*  HCT 33.6* 34.7*  PLT 232 240    Basename 10/31/11 0625  NA 136  K 4.4  CL 103  CO2 24  BUN 15  CREATININE 0.99  GLUCOSE 179*  CALCIUM 8.3*    Basename 11/01/11 0540 10/31/11 0625  LABPT -- --  INR 1.43 1.10    Assessment/Plan: Pt stable - dc ivf - dc pca - mag citrate - thorazine for hiccups   Amelya Mabry SCOTT 11/01/2011, 9:06 AM

## 2011-11-02 LAB — CBC
HCT: 29.9 % — ABNORMAL LOW (ref 39.0–52.0)
Hemoglobin: 10.1 g/dL — ABNORMAL LOW (ref 13.0–17.0)
MCH: 31.7 pg (ref 26.0–34.0)
MCHC: 33.8 g/dL (ref 30.0–36.0)
RDW: 14.7 % (ref 11.5–15.5)

## 2011-11-02 LAB — GLUCOSE, CAPILLARY: Glucose-Capillary: 223 mg/dL — ABNORMAL HIGH (ref 70–99)

## 2011-11-02 LAB — PROTIME-INR: Prothrombin Time: 18.7 seconds — ABNORMAL HIGH (ref 11.6–15.2)

## 2011-11-02 MED ORDER — WARFARIN SODIUM 6 MG PO TABS
6.0000 mg | ORAL_TABLET | Freq: Once | ORAL | Status: AC
  Administered 2011-11-02: 6 mg via ORAL
  Filled 2011-11-02: qty 1

## 2011-11-02 NOTE — Progress Notes (Signed)
Physical Therapy Treatment Patient Details Name: [name] Bell MRN: [id number] DOB: 1934/06/02 Today's Date: 11/02/2011  PT Assessment/Plan  PT - Assessment/Plan Comments on Treatment Session: Pt admitted s/p right TKA with c/o or fatigue this am.  Pt agreeable to attempt PT, but acknowledges that it may "only be a little bit."   PT Plan: Discharge plan remains appropriate;Frequency remains appropriate PT Frequency: 7X/week Follow Up Recommendations: Skilled nursing facility Equipment Recommended: Defer to next venue PT Goals  Acute Rehab PT Goals PT Goal Formulation: With patient Time For Goal Achievement: 7 days PT Goal: Supine/Side to Sit - Progress: Progressing toward goal PT Goal: Sit to Stand - Progress: Progressing toward goal PT Goal: Stand to Sit - Progress: Progressing toward goal PT Goal: Ambulate - Progress: Progressing toward goal PT Goal: Perform Home Exercise Program - Progress: Progressing toward goal  PT Treatment Precautions/Restrictions  Precautions Precautions: Knee;Fall Precaution Booklet Issued: No Required Braces or Orthoses: Yes Knee Immobilizer: On at all times (Right LE.) Restrictions Weight Bearing Restrictions: Yes RLE Weight Bearing: Weight bearing as tolerated Pain 5/10 in right knee.  Pt repositioned. Mobility (including Balance) Bed Mobility Bed Mobility: Yes Supine to Sit: 4: Min assist;With rails;HOB flat Supine to Sit Details (indicate cue type and reason): Assist for right LE only with cues for sequence. Sit to Supine: Not Tested (comment) Transfers Transfers: Yes Sit to Stand: 4: Min assist;With upper extremity assist;From bed Sit to Stand Details (indicate cue type and reason): Assist to off weight right LE due to pain with cues for hand placement. Stand to Sit: 4: Min assist;With upper extremity assist;To chair/3-in-1 Stand to Sit Details: Assist to slow descent with cues for hand/right LE  placement. Ambulation/Gait Ambulation/Gait: Yes Ambulation/Gait Assistance: 4: Min assist (Progressing to min (guard)) Ambulation/Gait Assistance Details (indicate cue type and reason): Assist to initially off weight right LE due to pain.  Distance limited by pain.  Cues for sequence with RW. Ambulation Distance (Feet): 10 Feet Assistive device: Rolling walker Gait Pattern: Step-to pattern;Decreased step length - right;Decreased stance time - right;Trunk flexed Stairs: No Wheelchair Mobility Wheelchair Mobility: No  Posture/Postural Control Posture/Postural Control: No significant limitations Balance Balance Assessed: No Exercise  Total Joint Exercises Ankle Circles/Pumps: AROM;Right;10 reps;Supine Quad Sets: AROM;Right;10 reps;Supine Short Arc Quad: AAROM;Right;10 reps;Supine Heel Slides: AAROM;Right;10 reps;Supine Hip ABduction/ADduction: AAROM;Right;10 reps;Supine Straight Leg Raises: AAROM;Right;10 reps;Supine End of Session PT - End of Session Equipment Utilized During Treatment: Gait belt;Right knee immobilizer Activity Tolerance: Patient limited by pain;Patient limited by fatigue Patient left: in chair;with call bell in reach Nurse Communication: Mobility status for transfers;Mobility status for ambulation General Behavior During Session: [facility] for tasks performed Cognition: [facility] for tasks performed  [name] Bell 11/02/2011, 11:16 AM  11/02/2011 [name] Bell, PT, DPT [phone]

## 2011-11-02 NOTE — Progress Notes (Signed)
OT Cancellation Note  Treatment cancelled today due to patient's refusal to participate. Pt states he is in pain and would prefer to wait til later this afternoon. Will return if able.  [facility] Hannalee Castor, OTR/L  [phone] 11/02/2011 11/02/2011, 4:35 PM

## 2011-11-02 NOTE — Progress Notes (Signed)
Subjective: Pt slow to progress with pt   Objective: Vital signs in last 24 hours: Temp:  [97.6 F (36.4 C)-98.8 F (37.1 C)] 97.7 F (36.5 C) (01/25 0600) Pulse Rate:  [90-114] 90  (01/25 0600) Resp:  [17-20] 18  (01/25 0600) BP: (100-140)/(62-78) 104/63 mmHg (01/25 0600) SpO2:  [92 %-98 %] 93 % (01/25 0600)  Intake/Output from previous day: 01/24 0701 - 01/25 0700 In: 540 [P.O.:540] Out: 550 [Urine:550] Intake/Output this shift:    Exam:  Sensation intact distally Intact pulses distally Dorsiflexion/Plantar flexion intact  Labs:  Basename 11/01/11 0540 10/31/11 0625  HGB 11.3* 11.6*    Basename 11/01/11 0540 10/31/11 0625  WBC 10.6* 9.9  RBC 3.60* 3.69*  HCT 33.6* 34.7*  PLT 232 240    Basename 10/31/11 0625  NA 136  K 4.4  CL 103  CO2 24  BUN 15  CREATININE 0.99  GLUCOSE 179*  CALCIUM 8.3*    Basename 11/02/11 0530 11/01/11 0540  LABPT -- --  INR 1.53* 1.43    Assessment/Plan: Will need snf Monday - incision ok - cont cpm   DEAN,GREGORY SCOTT 11/02/2011, 7:32 AM

## 2011-11-02 NOTE — Progress Notes (Signed)
Admit Complaint:Pt POD#2s/p Right TKA.  Pharmacist System-Based Medication Review: Anticoagulation Started on Coumadin 10/30/11 for VTE prophylaxis s/p R TKA. INR 1.53, but rising s/p 3 doses. H/H and plts stable; SNF Monday   1) Rpt Coumadin 6mg  today. 2) INR in am

## 2011-11-02 NOTE — Progress Notes (Signed)
Spoke to patient about side effects of Thorazine. Patient stated that Thorazine gave him hallucinations and he did not want to ever take it again. Spoke to Dr. August Saucer and Thorazine d/c'd and now listed as allergy. Gildardo Cranker, RN

## 2011-11-03 LAB — GLUCOSE, CAPILLARY
Glucose-Capillary: 178 mg/dL — ABNORMAL HIGH (ref 70–99)
Glucose-Capillary: 237 mg/dL — ABNORMAL HIGH (ref 70–99)

## 2011-11-03 MED ORDER — WARFARIN SODIUM 6 MG PO TABS
6.0000 mg | ORAL_TABLET | Freq: Once | ORAL | Status: AC
  Administered 2011-11-03: 6 mg via ORAL
  Filled 2011-11-03: qty 1

## 2011-11-03 MED ORDER — FLEET ENEMA 7-19 GM/118ML RE ENEM: 1.0000 | ENEMA | Freq: Every day | RECTAL | Status: DC | PRN

## 2011-11-03 NOTE — Progress Notes (Signed)
Physical Therapy Treatment Patient Details Name: [name] Bell MRN: [id number] DOB: 1934-08-20 Today's Date: 11/03/2011  PT Assessment/Plan  PT - Assessment/Plan Comments on Treatment Session: Pt agreeable to allow therapy to help him up to [employer]. Once seated patient stated, "this might take longer than I thought" Pt attempting to have BM. Pt requested to sit on [facility] for a while. RN and NT made aware.  PT Plan: Discharge plan remains appropriate PT Frequency: 7X/week Follow Up Recommendations: Skilled nursing facility Equipment Recommended: Defer to next venue PT Goals  Acute Rehab PT Goals PT Goal: Supine/Side to Sit - Progress: Progressing toward goal PT Goal: Sit to Supine/Side - Progress: Progressing toward goal PT Goal: Sit to Stand - Progress: Progressing toward goal PT Goal: Stand to Sit - Progress: Progressing toward goal  PT Treatment Precautions/Restrictions  Precautions Precautions: Knee;Fall Precaution Booklet Issued: No Required Braces or Orthoses: Yes Knee Immobilizer: On at all times Restrictions Weight Bearing Restrictions: Yes RLE Weight Bearing: Weight bearing as tolerated Mobility (including Balance) Bed Mobility Supine to Sit: 4: Min assist Supine to Sit Details (indicate cue type and reason): A with R LE and use of chuck pad to shirt hips towards EOB prior to sitting up. Cues for safe technique Transfers Sit to Stand: 4: Min assist;From bed;With upper extremity assist Sit to Stand Details (indicate cue type and reason): Cues for LE positioning prior to stand and cues for safe hand placement and to sit slowly Stand to Sit: 4: Min assist;With upper extremity assist;With armrests;To chair/3-in-1 Stand to Sit Details: A to control descent to [facility].  Ambulation/Gait Ambulation/Gait: No    Exercise    End of Session PT - End of Session Equipment Utilized During Treatment: Gait belt;Right knee immobilizer Activity Tolerance: Patient limited by fatigue;Patient  limited by pain Patient left: in chair;with call bell in reach;Other (comment) (NT made aware) Nurse Communication: Mobility status for transfers General Behavior During Session: [facility] for tasks performed Cognition: [facility] for tasks performed  Robinette, Adline Potter 11/03/2011, 1:12 PM 11/03/2011 Fredrich Birks PTA [phone] pager [phone] office

## 2011-11-03 NOTE — Progress Notes (Signed)
Subjective: 4 Days Post-Op Procedure(s) (LRB): COMPUTER ASSISTED TOTAL KNEE ARTHROPLASTY (Right)  Patient reports pain as mild.    Objective:   VITALS:  Temp:  [97.3 F (36.3 C)-98.1 F (36.7 C)] 97.3 F (36.3 C) (01/26 0556) Pulse Rate:  [88-113] 95  (01/26 0556) Resp:  [16-18] 16  (01/26 0556) BP: (118-133)/(66-80) 133/80 mmHg (01/26 0556) SpO2:  [92 %-99 %] 93 % (01/26 0556)  Neurologically intact ABD soft Sensation intact distally Intact pulses distally Incision: dressing C/D/I and no drainage Compartment soft   LABS  Basename 11/02/11 0530 11/01/11 0540  HGB 10.1* 11.3*  WBC 10.3 10.6*  PLT 219 232   No results found for this basename: NA:2,K:2,CL:2,CO2:2,BUN:2,CREATININE:2,GLUCOSE:2, in the last 72 hours  Basename 11/03/11 0600 11/02/11 0530  LABPT -- --  INR 1.78* 1.53*     Assessment/Plan: 4 Days Post-Op Procedure(s) (LRB): COMPUTER ASSISTED TOTAL KNEE ARTHROPLASTY (Right)  Advance diet Up with therapy D/C IV fluids Discharge to SNF  NITKA,JAMES E 11/03/2011, 11:51 AM

## 2011-11-03 NOTE — Progress Notes (Signed)
Admit Complaint:Pt POD#2s/p Right TKA.  Pharmacist System-Based Medication Review: Anticoagulation Started on Coumadin 10/30/11 for VTE prophylaxis s/p R TKA. INR 1.78, but rising s/p 4 doses. Goal 2-3.  No H/H checked today; SNF Monday   Plan: 1) Rpt Coumadin 6mg  today.

## 2011-11-04 LAB — GLUCOSE, CAPILLARY
Glucose-Capillary: 185 mg/dL — ABNORMAL HIGH (ref 70–99)
Glucose-Capillary: 220 mg/dL — ABNORMAL HIGH (ref 70–99)

## 2011-11-04 MED ORDER — ZINC OXIDE 40 % EX OINT
TOPICAL_OINTMENT | Freq: Two times a day (BID) | CUTANEOUS | Status: DC
  Administered 2011-11-04 – 2011-11-06 (×3): via TOPICAL
  Filled 2011-11-04: qty 114

## 2011-11-04 MED ORDER — WARFARIN SODIUM 6 MG PO TABS
6.0000 mg | ORAL_TABLET | Freq: Once | ORAL | Status: AC
  Administered 2011-11-04: 6 mg via ORAL
  Filled 2011-11-04: qty 1

## 2011-11-04 MED ORDER — DIPHENHYDRAMINE HCL 25 MG PO CAPS: 25.0000 mg | ORAL_CAPSULE | Freq: Three times a day (TID) | ORAL | Status: DC | PRN

## 2011-11-04 NOTE — Progress Notes (Signed)
Subjective: 5 Days Post-Op Procedure(s) (LRB): COMPUTER ASSISTED TOTAL KNEE ARTHROPLASTY (Right)  Patient reports pain as mild. He is working with his partner to find a skilled nursing facility near his home for post hospitalization. His spirits are good.   Objective:   VITALS:  Temp:  [97.8 F (36.6 C)-98.6 F (37 C)] 98.5 F (36.9 C) (01/27 0603) Pulse Rate:  [90-100] 90  (01/27 0603) Resp:  [16-18] 16  (01/27 0603) BP: (110-116)/(67-71) 116/71 mmHg (01/27 0603) SpO2:  [94 %-96 %] 96 % (01/27 0603)  Neurologically intact ABD soft Neurovascular intact Sensation intact distally Intact pulses distally Dorsiflexion/Plantar flexion intact Incision: dressing C/D/I No cellulitis present   LABS  Basename 11/02/11 0530  HGB 10.1*  WBC 10.3  PLT 219   No results found for this basename: NA:2,K:2,CL:2,CO2:2,BUN:2,CREATININE:2,GLUCOSE:2, in the last 72 hours  Basename 11/04/11 0500 11/03/11 0600  LABPT -- --  INR 1.99* 1.78*     Assessment/Plan: 5 Days Post-Op Procedure(s) (LRB): COMPUTER ASSISTED TOTAL KNEE ARTHROPLASTY (Right)  Advance diet Up with therapy Discharge to SNF  NITKA,JAMES E 11/04/2011, 12:58 PM

## 2011-11-04 NOTE — Progress Notes (Signed)
Admit Complaint:Pt s/p Right TKA.  Pharmacist System-Based Medication Review: Anticoagulation Started on Coumadin 10/30/11 for VTE prophylaxis s/p R TKA. INR 1.99, but rising s/p 5 doses. Goal 2-3.  No H/H checked today; SNF Monday  Plan:   Rpt Coumadin 6mg  today.

## 2011-11-04 NOTE — Progress Notes (Signed)
Physical Therapy Treatment Patient Details Name: [name] Bell MRN: [id number] DOB: May 24, 1934 Today's Date: 11/04/2011  PT Assessment/Plan  PT - Assessment/Plan Comments on Treatment Session: Pt very motivated & willing to participate.  Progressing with PT goals as evident in increasing ambulation distance today.  Increased tx time due to pt asking questions Re: this clinicians recommendations on ST-SNF's.  Encouraged pt to have family and/or friend go to assess facilitties & offerered insight as to different questions that would be appropriate to ask when visiting.   PT Plan: Discharge plan remains appropriate PT Frequency: 7X/week Follow Up Recommendations: Skilled nursing facility Equipment Recommended: Defer to next venue PT Goals  Acute Rehab PT Goals PT Goal: Sit to Stand - Progress: Progressing toward goal PT Goal: Stand to Sit - Progress: Progressing toward goal PT Goal: Ambulate - Progress: Progressing toward goal PT Goal: Perform Home Exercise Program - Progress: Progressing toward goal  PT Treatment Precautions/Restrictions  Precautions Precautions: Knee;Fall Precaution Booklet Issued: No Required Braces or Orthoses: Yes Knee Immobilizer: On at all times Restrictions Weight Bearing Restrictions: Yes RLE Weight Bearing: Weight bearing as tolerated Mobility (including Balance) Bed Mobility Bed Mobility: No Transfers Sit to Stand: Other (comment);From chair/3-in-1;With armrests;With upper extremity assist (Min Guard (A)) Sit to Stand Details (indicate cue type and reason): Cues for RLE positioning & hand placement.   Stand to Sit: Other (comment);To chair/3-in-1 [employer] (A)) Stand to Sit Details: Cues for hand placement & use of UE's to control descent.   Ambulation/Gait Ambulation/Gait Assistance: Other (comment) (Min Guard (A)) Ambulation/Gait Assistance Details (indicate cue type and reason): cues for sequencing, advancement of RW, increase lateral wt shifting  to Rt. LE, relax shoulders, maintain upright posture.   Ambulation Distance (Feet): 60 Feet Assistive device: Rolling walker Gait Pattern: Step-to pattern;Antalgic;Decreased step length - left;Decreased stance time - right;Trunk flexed Stairs: No Wheelchair Mobility Wheelchair Mobility: No    Exercise  Total Joint Exercises Ankle Circles/Pumps: AROM;Both;10 reps;Seated Quad Sets: AROM;Strengthening;Both;10 reps;Seated Gluteal Sets: AROM;Strengthening;Both;Seated Straight Leg Raises: AAROM;Strengthening;Right;10 reps;Seated End of Session PT - End of Session Equipment Utilized During Treatment: Gait belt;Right knee immobilizer Activity Tolerance: Patient tolerated treatment well Patient left: in chair;with call bell in reach General Behavior During Session: [facility] for tasks performed Cognition: [facility] for tasks performed  Lara Mulch 11/04/2011, 1:13 PM [phone]

## 2011-11-04 NOTE — Progress Notes (Signed)
CSW met with pt who confirmed agreeable to ST SNF placement. CSW explained placement process and answered questions. FL2 complete and on chart for MD signature. Will f/u with offers.  Dellie Burns, MSW, [place] [phone] (weekend)

## 2011-11-05 LAB — GLUCOSE, CAPILLARY
Glucose-Capillary: 214 mg/dL — ABNORMAL HIGH (ref 70–99)
Glucose-Capillary: 187 mg/dL — ABNORMAL HIGH (ref 70–99)
Glucose-Capillary: 159 mg/dL — ABNORMAL HIGH (ref 70–99)
Glucose-Capillary: 139 mg/dL — ABNORMAL HIGH (ref 70–99)

## 2011-11-05 LAB — PROTIME-INR
INR: 2.1 — ABNORMAL HIGH (ref 0.00–1.49)
Prothrombin Time: 23.9 seconds — ABNORMAL HIGH (ref 11.6–15.2)

## 2011-11-05 MED ORDER — WARFARIN SODIUM 6 MG PO TABS
6.0000 mg | ORAL_TABLET | Freq: Once | ORAL | Status: AC
  Administered 2011-11-05: 6 mg via ORAL
  Filled 2011-11-05: qty 1

## 2011-11-05 NOTE — Progress Notes (Addendum)
Covering Clinical Social Worker followed up with patient on bed offers received. Patient stated that he would prefer [facility]. CSW contacted facility and faxed patient's information for review. Toniann Fail representative at facility, made a bed offer and is able to receive patient tomorrow. CSW contacted patient on bed offer and is agreeable to go to facility.  Rozetta Nunnery MSW, [employer] [phone]

## 2011-11-05 NOTE — Progress Notes (Signed)
ANTICOAGULATION CONSULT NOTE - Follow Up Consult  Pharmacy Consult for warfarin Indication: DVT prophylaxis-s/p R TKA  Assessment: 77 yom s/p R TKA on day#6 of warfarin therapy. INR now therapeutic at 2.1 and stable without bleeding complications.   With INR stabilizing on 6mg  daily, would consider continuing this regimen at discharge. If requirements increase patient could take 1 1/2 tabs (9mg ) 1-2 days per week.   Goal of Therapy:  INR 2-3   Plan:  Warfarin 6mg  today. Education complete  Labs:  Basename 11/05/11 0545 11/04/11 0500 11/03/11 0600  HGB -- -- --  HCT -- -- --  PLT -- -- --  APTT -- -- --  LABPROT 23.9* 22.9* 21.0*  INR 2.10* 1.99* 1.78*  HEPARINUNFRC -- -- --  CREATININE -- -- --  CKTOTAL -- -- --  CKMB -- -- --  TROPONINI -- -- --   Estimated Creatinine Clearance: 65.4 ml/min (by C-G formula based on Cr of 0.99).   [name] Bell 11/05/2011,9:50 AM

## 2011-11-05 NOTE — Progress Notes (Signed)
Physical Therapy Treatment Patient Details Name: [name] Bell MRN: [id number] DOB: 29-Jul-1934 Today's Date: 11/05/2011  PT Assessment/Plan  PT - Assessment/Plan Comments on Treatment Session: Pt admitted s/p right TKA and is very motivated to progress.  Pt able to increase independence as well as distance ambulated. PT Plan: Discharge plan remains appropriate;Frequency remains appropriate PT Frequency: 7X/week Follow Up Recommendations: Skilled nursing facility Equipment Recommended: Defer to next venue PT Goals  Acute Rehab PT Goals PT Goal Formulation: With patient Time For Goal Achievement: 7 days PT Goal: Sit to Stand - Progress: Progressing toward goal PT Goal: Stand to Sit - Progress: Progressing toward goal PT Goal: Ambulate - Progress: Progressing toward goal PT Goal: Perform Home Exercise Program - Progress: Progressing toward goal  PT Treatment Precautions/Restrictions  Precautions Precautions: Knee;Fall Precaution Booklet Issued: No Required Braces or Orthoses: Yes Knee Immobilizer: On at all times (Right LE.) Restrictions Weight Bearing Restrictions: Yes RLE Weight Bearing: Weight bearing as tolerated Pain 3/10 in right knee.  Pt repositioned. Mobility (including Balance) Bed Mobility Bed Mobility: No Supine to Sit: Not tested (comment) Sit to Supine: Not Tested (comment) Transfers Transfers: Yes Sit to Stand: 4: Min assist;With upper extremity assist;From chair/3-in-1 (Min (guard)) Sit to Stand Details (indicate cue type and reason): Guarding for balance with cues for hand placement. Stand to Sit: 4: Min assist;With upper extremity assist;To chair/3-in-1 (Min (guard)) Stand to Sit Details: Guarding for balance with cues for hand placement and slow descent. Ambulation/Gait Ambulation/Gait: Yes Ambulation/Gait Assistance: 4: Min assist (Min (guard)) Ambulation/Gait Assistance Details (indicate cue type and reason): Guarding for balance with cues for  step-through sequence today as well as tall posture. Ambulation Distance (Feet): 130 Feet Assistive device: Rolling walker Gait Pattern: Step-through pattern;Decreased step length - right;Antalgic;Trunk flexed Stairs: No Wheelchair Mobility Wheelchair Mobility: No  Posture/Postural Control Posture/Postural Control: No significant limitations Balance Balance Assessed: No Exercise  Total Joint Exercises Ankle Circles/Pumps: AROM;Right;15 reps;Supine Quad Sets: AROM;Right;15 reps;Supine Short Arc Quad: AAROM;Right;Other reps (comment);Supine (15) Heel Slides: AAROM;Right;15 reps;Supine Hip ABduction/ADduction: AROM;Right;15 reps;Supine Straight Leg Raises: AAROM;Right;15 reps;Supine Long Arc Quad: AAROM;Right;15 reps;Seated Knee Flexion: AAROM;Right;15 reps;Seated End of Session PT - End of Session Equipment Utilized During Treatment: Gait belt;Right knee immobilizer Activity Tolerance: Patient tolerated treatment well Patient left: in chair;with call bell in reach Nurse Communication: Mobility status for transfers;Mobility status for ambulation General Behavior During Session: [facility] for tasks performed Cognition: [facility] for tasks performed  [name] 11/05/2011, 12:57 PM  11/05/2011 [name], PT, DPT [phone]

## 2011-11-05 NOTE — Progress Notes (Signed)
Subjective: amb in hall - no stairs yet   Objective: Vital signs in last 24 hours: Temp:  [98.1 F (36.7 C)-98.3 F (36.8 C)] 98.3 F (36.8 C) (01/28 0623) Pulse Rate:  [92-95] 92  (01/28 0623) Resp:  [18] 18  (01/28 0623) BP: (142-150)/(73-75) 142/75 mmHg (01/28 0623) SpO2:  [94 %-96 %] 96 % (01/28 0623)  Intake/Output from previous day: 01/27 0701 - 01/28 0700 In: 960 [P.O.:960] Out: -  Intake/Output this shift:    Exam:  Sensation intact distally Intact pulses distally Dorsiflexion/Plantar flexion intact  Labs: No results found for this basename: HGB:5 in the last 72 hours No results found for this basename: WBC:2,RBC:2,HCT:2,PLT:2 in the last 72 hours No results found for this basename: NA:2,K:2,CL:2,CO2:2,BUN:2,CREATININE:2,GLUCOSE:2,CALCIUM:2 in the last 72 hours  Basename 11/05/11 0545 11/04/11 0500  LABPT -- --  INR 2.10* 1.99*    Assessment/Plan: Fl 2 signed ready for snf - inr therapeutic   DEAN,GREGORY SCOTT 11/05/2011, 7:27 AM

## 2011-11-05 NOTE — Progress Notes (Signed)
OT Note  Attempted to see pt. Pt with visitors and requested to wait til later. Will return if able. [facility], OTR/L  [phone] 11/05/2011

## 2011-11-06 LAB — PROTIME-INR
INR: 2.65 — ABNORMAL HIGH (ref 0.00–1.49)
Prothrombin Time: 28.7 seconds — ABNORMAL HIGH (ref 11.6–15.2)

## 2011-11-06 MED ORDER — WARFARIN SODIUM 2.5 MG PO TABS: 2.5000 mg | ORAL_TABLET | Freq: Every day | ORAL | Status: AC

## 2011-11-06 MED ORDER — WARFARIN SODIUM 2.5 MG PO TABS
2.5000 mg | ORAL_TABLET | Freq: Once | ORAL | Status: DC
  Filled 2011-11-06: qty 1

## 2011-11-06 MED ORDER — OXYCODONE HCL 5 MG PO TABS: 5.0000 mg | ORAL_TABLET | ORAL | Status: AC | PRN

## 2011-11-06 NOTE — Progress Notes (Signed)
OT Cancellation Note  Treatment cancelled today due to patient's refusal to participate.  [facility] Larua Collier, OTR/L  [phone] 11/06/2011 11/06/2011, 2:23 PM

## 2011-11-06 NOTE — Discharge Planning (Signed)
Physician Discharge Summary  Patient ID: [name] Bell MRN: [id number] DOB/AGE: [birth year] 78 y.o.  Admit date: 10/30/2011 Discharge date: 11/06/2011  Admission Diagnoses:  Principal Problem:  *Arthritis of knee, right   Discharge Diagnoses:  Same  Surgeries: Procedure(s): COMPUTER ASSISTED TOTAL KNEE ARTHROPLASTY on 10/30/2011   Consultants:    Discharged Condition: Stable  Hospital Course: [name] Bell is an 78 y.o. male who was admitted 10/30/2011 with a chief complaint of right knee pain, and found to have a diagnosis of Arthritis of knee, right.  They were brought to the operating room on 10/30/2011 and underwent the above named procedures.    Antibiotics given:  Anti-infectives     Start     Dose/Rate Route Frequency Ordered Stop   10/31/11 1100   vancomycin (VANCOCIN) IVPB 1000 mg/200 mL premix        1,000 mg 200 mL/hr over 60 Minutes Intravenous Every 12 hours 10/31/11 0851 10/31/11 1115   10/30/11 2300   vancomycin (VANCOCIN) IVPB 1000 mg/200 mL premix  Status:  Discontinued        1,000 mg 200 mL/hr over 60 Minutes Intravenous Every 12 hours 10/30/11 1721 10/31/11 0851   10/30/11 1045   vancomycin (VANCOCIN) IVPB 1000 mg/200 mL premix        1,000 mg 200 mL/hr over 60 Minutes Intravenous  Once 10/30/11 1038 10/30/11 1105   10/29/11 1500   ceFAZolin (ANCEF) IVPB 2 g/50 mL premix  Status:  Discontinued        2 g 100 mL/hr over 30 Minutes Intravenous 60 min pre-op 10/29/11 1456 10/30/11 1038        .  Recent vital signs:  Filed Vitals:   11/06/11 0456  BP: 126/74  Pulse: 81  Temp: 98 F (36.7 C)  Resp: 18    Recent laboratory studies:  Results for orders placed during the hospital encounter of 10/30/11  GLUCOSE, CAPILLARY      Component Value Range   Glucose-Capillary 121 (*) 70 - 99 (mg/dL)  GLUCOSE, CAPILLARY      Component Value Range   Glucose-Capillary 175 (*) 70 - 99 (mg/dL)  PROTIME-INR      Component Value Range   Prothrombin Time  14.4  11.6 - 15.2 (seconds)   INR 1.10  0.00 - 1.49   CBC      Component Value Range   WBC 9.9  4.0 - 10.5 (K/uL)   RBC 3.69 (*) 4.22 - 5.81 (MIL/uL)   Hemoglobin 11.6 (*) 13.0 - 17.0 (g/dL)   HCT 96.2 (*) 95.2 - 52.0 (%)   MCV 94.0  78.0 - 100.0 (fL)   MCH 31.4  26.0 - 34.0 (pg)   MCHC 33.4  30.0 - 36.0 (g/dL)   RDW 84.1  32.4 - 40.1 (%)   Platelets 240  150 - 400 (K/uL)  BASIC METABOLIC PANEL      Component Value Range   Sodium 136  135 - 145 (mEq/L)   Potassium 4.4  3.5 - 5.1 (mEq/L)   Chloride 103  96 - 112 (mEq/L)   CO2 24  19 - 32 (mEq/L)   Glucose, Bld 179 (*) 70 - 99 (mg/dL)   BUN 15  6 - 23 (mg/dL)   Creatinine, Ser 0.27  0.50 - 1.35 (mg/dL)   Calcium 8.3 (*) 8.4 - 10.5 (mg/dL)   GFR calc non Af Amer 78 (*) >90 (mL/min)   GFR calc Af Amer >90  >90 (mL/min)  GLUCOSE, CAPILLARY  Component Value Range   Glucose-Capillary 155 (*) 70 - 99 (mg/dL)  GLUCOSE, CAPILLARY      Component Value Range   Glucose-Capillary 142 (*) 70 - 99 (mg/dL)   Comment 1 Notify RN     Comment 2 Documented in Chart    PROTIME-INR      Component Value Range   Prothrombin Time 17.7 (*) 11.6 - 15.2 (seconds)   INR 1.43  0.00 - 1.49   CBC      Component Value Range   WBC 10.6 (*) 4.0 - 10.5 (K/uL)   RBC 3.60 (*) 4.22 - 5.81 (MIL/uL)   Hemoglobin 11.3 (*) 13.0 - 17.0 (g/dL)   HCT 16.1 (*) 09.6 - 52.0 (%)   MCV 93.3  78.0 - 100.0 (fL)   MCH 31.4  26.0 - 34.0 (pg)   MCHC 33.6  30.0 - 36.0 (g/dL)   RDW 04.5  40.9 - 81.1 (%)   Platelets 232  150 - 400 (K/uL)  GLUCOSE, CAPILLARY      Component Value Range   Glucose-Capillary 139 (*) 70 - 99 (mg/dL)  GLUCOSE, CAPILLARY      Component Value Range   Glucose-Capillary 149 (*) 70 - 99 (mg/dL)   Comment 1 Documented in Chart     Comment 2 Notify RN    GLUCOSE, CAPILLARY      Component Value Range   Glucose-Capillary 161 (*) 70 - 99 (mg/dL)  GLUCOSE, CAPILLARY      Component Value Range   Glucose-Capillary 192 (*) 70 - 99 (mg/dL)    Comment 1 Documented in Chart     Comment 2 Notify RN    GLUCOSE, CAPILLARY      Component Value Range   Glucose-Capillary 184 (*) 70 - 99 (mg/dL)  GLUCOSE, CAPILLARY      Component Value Range   Glucose-Capillary 199 (*) 70 - 99 (mg/dL)   Comment 1 Documented in Chart     Comment 2 Notify RN    PROTIME-INR      Component Value Range   Prothrombin Time 18.7 (*) 11.6 - 15.2 (seconds)   INR 1.53 (*) 0.00 - 1.49   CBC      Component Value Range   WBC 10.3  4.0 - 10.5 (K/uL)   RBC 3.19 (*) 4.22 - 5.81 (MIL/uL)   Hemoglobin 10.1 (*) 13.0 - 17.0 (g/dL)   HCT 91.4 (*) 78.2 - 52.0 (%)   MCV 93.7  78.0 - 100.0 (fL)   MCH 31.7  26.0 - 34.0 (pg)   MCHC 33.8  30.0 - 36.0 (g/dL)   RDW 95.6  21.3 - 08.6 (%)   Platelets 219  150 - 400 (K/uL)  GLUCOSE, CAPILLARY      Component Value Range   Glucose-Capillary 165 (*) 70 - 99 (mg/dL)   Comment 1 Documented in Chart     Comment 2 Notify RN    GLUCOSE, CAPILLARY      Component Value Range   Glucose-Capillary 175 (*) 70 - 99 (mg/dL)   Comment 1 Documented in Chart     Comment 2 Notify RN    GLUCOSE, CAPILLARY      Component Value Range   Glucose-Capillary 265 (*) 70 - 99 (mg/dL)   Comment 1 Documented in Chart     Comment 2 Notify RN    GLUCOSE, CAPILLARY      Component Value Range   Glucose-Capillary 223 (*) 70 - 99 (mg/dL)   Comment 1 Documented in Chart  Comment 2 Notify RN    PROTIME-INR      Component Value Range   Prothrombin Time 21.0 (*) 11.6 - 15.2 (seconds)   INR 1.78 (*) 0.00 - 1.49   GLUCOSE, CAPILLARY      Component Value Range   Glucose-Capillary 182 (*) 70 - 99 (mg/dL)   Comment 1 Documented in Chart     Comment 2 Notify RN    GLUCOSE, CAPILLARY      Component Value Range   Glucose-Capillary 178 (*) 70 - 99 (mg/dL)  GLUCOSE, CAPILLARY      Component Value Range   Glucose-Capillary 237 (*) 70 - 99 (mg/dL)   Comment 1 Notify RN    GLUCOSE, CAPILLARY      Component Value Range   Glucose-Capillary 260 (*) 70 -  99 (mg/dL)   Comment 1 Notify RN    PROTIME-INR      Component Value Range   Prothrombin Time 22.9 (*) 11.6 - 15.2 (seconds)   INR 1.99 (*) 0.00 - 1.49   GLUCOSE, CAPILLARY      Component Value Range   Glucose-Capillary 201 (*) 70 - 99 (mg/dL)  GLUCOSE, CAPILLARY      Component Value Range   Glucose-Capillary 188 (*) 70 - 99 (mg/dL)  GLUCOSE, CAPILLARY      Component Value Range   Glucose-Capillary 185 (*) 70 - 99 (mg/dL)   Comment 1 Notify RN    GLUCOSE, CAPILLARY      Component Value Range   Glucose-Capillary 220 (*) 70 - 99 (mg/dL)   Comment 1 Notify RN    PROTIME-INR      Component Value Range   Prothrombin Time 23.9 (*) 11.6 - 15.2 (seconds)   INR 2.10 (*) 0.00 - 1.49   GLUCOSE, CAPILLARY      Component Value Range   Glucose-Capillary 221 (*) 70 - 99 (mg/dL)   Comment 1 Documented in Chart     Comment 2 Notify RN    GLUCOSE, CAPILLARY      Component Value Range   Glucose-Capillary 214 (*) 70 - 99 (mg/dL)   Comment 1 Documented in Chart     Comment 2 Notify RN    GLUCOSE, CAPILLARY      Component Value Range   Glucose-Capillary 187 (*) 70 - 99 (mg/dL)   Comment 1 Documented in Chart     Comment 2 Notify RN    GLUCOSE, CAPILLARY      Component Value Range   Glucose-Capillary 159 (*) 70 - 99 (mg/dL)   Comment 1 Documented in Chart     Comment 2 Notify RN    PROTIME-INR      Component Value Range   Prothrombin Time 28.7 (*) 11.6 - 15.2 (seconds)   INR 2.65 (*) 0.00 - 1.49   GLUCOSE, CAPILLARY      Component Value Range   Glucose-Capillary 139 (*) 70 - 99 (mg/dL)   Comment 1 Documented in Chart     Comment 2 Notify RN    GLUCOSE, CAPILLARY      Component Value Range   Glucose-Capillary 201 (*) 70 - 99 (mg/dL)    Discharge Medications:   Medication List  As of 11/06/2011 11:30 AM   STOP taking these medications         meloxicam 15 MG tablet      vitamin E 400 UNIT capsule         TAKE these medications         Fluticasone-Salmeterol  250-50 MCG/DOSE  Aepb   Commonly known as: ADVAIR   Inhale 1 puff into the lungs every 12 (twelve) hours.      lisinopril 10 MG tablet   Commonly known as: PRINIVIL,ZESTRIL   Take 10 mg by mouth daily.      MAGNESIUM-POTASSIUM PO   Take 1 tablet by mouth daily.      metFORMIN 850 MG tablet   Commonly known as: GLUCOPHAGE   Take 1 tablet (850 mg total) by mouth 2 (two) times daily with a meal.      multivitamin with minerals tablet   Take 1 tablet by mouth daily.      oxyCODONE 5 MG immediate release tablet   Commonly known as: Oxy IR/ROXICODONE   Take 1-2 tablets (5-10 mg total) by mouth every 3 (three) hours as needed.      pioglitazone 15 MG tablet   Commonly known as: ACTOS   Take 1 tablet (15 mg total) by mouth daily.      simvastatin 80 MG tablet   Commonly known as: ZOCOR   Take 1 tablet (80 mg total) by mouth every Monday, Wednesday, and Friday.      Testosterone 20.25 MG/ACT (1.62%) Gel   Place 4 Squirts onto the skin daily.      vitamin B-12 1000 MCG tablet   Commonly known as: CYANOCOBALAMIN   Take 1,000 mcg by mouth daily.      vitamin C 1000 MG tablet   Take 1,000 mg by mouth daily.      warfarin 2.5 MG tablet   Commonly known as: COUMADIN   Take 1 tablet (2.5 mg total) by mouth daily.            Diagnostic Studies: X-ray Knee Right Port  10/30/2011  *RADIOLOGY REPORT*  Clinical Data: Osteoarthritis of the right knee.  PORTABLE RIGHT KNEE - 1-2 VIEW  Comparison: MRI dated 07/06/2011  Findings: AP and lateral views demonstrate the patient has undergone right total knee replacement.  The components appear in excellent position.  Soft tissue drain in place.  IMPRESSION: Satisfactory appearance of the right knee after total knee arthroplasty.  Original Report Authenticated By: Gwynn Burly, M.D.    Disposition: Final discharge disposition not confirmed  Discharge Orders    Future Orders Please Complete By Expires   Diet - low sodium heart healthy      Call MD /  Call 911      Comments:   If you experience chest pain or shortness of breath, CALL 911 and be transported to the hospital emergency room.  If you develope a fever above 101 F, pus (white drainage) or increased drainage or redness at the wound, or calf pain, call your surgeon's office.   Constipation Prevention      Comments:   Drink plenty of fluids.  Prune juice may be helpful.  You may use a stool softener, such as Colace (over the counter) 100 mg twice a day.  Use MiraLax (over the counter) for constipation as needed.   Increase activity slowly as tolerated      Weight Bearing as taught in Physical Therapy      Comments:   Use a walker or crutches as instructed.   Discharge instructions      Comments:   Keep incision dry. CPM 6 hours per day Physical therapy daily for rom and strengthening         Signed: Alanzo Lamb SCOTT 11/06/2011, 11:30 AM

## 2011-11-06 NOTE — Progress Notes (Signed)
11/06/11 7:45 PT Note: Pt refused PT this morning due to pain along with several other reasons distracting him (ie-need to talk to Dr. August Saucer, transfer to SNF today, eventual need for BM, etc.).  Encouraged pt to attempt mobility, but pt too distracted/preoccupied by other concerns.  Will follow-up as able.  11/06/2011 [name], PT, DPT [phone]

## 2011-11-06 NOTE — Progress Notes (Signed)
Patient for d/c today to SNF bed at [facility]. He is pleased with the location and will plan transport via EMS. Reece Levy, MSW, Theresia Majors [phone]

## 2011-11-06 NOTE — Progress Notes (Signed)
Subjective: Pain controlled   Objective: Vital signs in last 24 hours: Temp:  [98 F (36.7 C)-98.5 F (36.9 C)] 98 F (36.7 C) (01/29 0456) Pulse Rate:  [81-89] 81  (01/29 0456) Resp:  [16-18] 18  (01/29 0456) BP: (112-133)/(58-74) 126/74 mmHg (01/29 0456) SpO2:  [96 %-98 %] 97 % (01/29 0456) FiO2 (%):  [97 %] 97 % (01/29 0853)  Intake/Output from previous day: 01/28 0701 - 01/29 0700 In: 720 [P.O.:720] Out: -  Intake/Output this shift:    Exam:  Sensation intact distally Intact pulses distally Dorsiflexion/Plantar flexion intact  Labs: No results found for this basename: HGB:5 in the last 72 hours No results found for this basename: WBC:2,RBC:2,HCT:2,PLT:2 in the last 72 hours No results found for this basename: NA:2,K:2,CL:2,CO2:2,BUN:2,CREATININE:2,GLUCOSE:2,CALCIUM:2 in the last 72 hours  Basename 11/06/11 0610 11/05/11 0545  LABPT -- --  INR 2.65* 2.10*    Assessment/Plan: Dc to snf today   DEAN,GREGORY SCOTT 11/06/2011, 11:20 AM

## 2011-11-06 NOTE — Progress Notes (Signed)
ANTICOAGULATION CONSULT NOTE - Follow Up Consult  Pharmacy Consult for warfarin Indication: DVT prophylaxis-s/p R TKA   Allergies  Allergen Reactions  . Thorazine (Chlorpromazine) Other (See Comments)    Hallucinations. Patient does not want to take anymore.  . Sulfa Antibiotics Hives    Patient Measurements: Height: 5\' 5"  (165.1 cm) (from preadmission 10/23/11) Weight: 192 lb 1.6 oz (87.136 kg) (from preadmission 10/23/11) IBW/kg (Calculated) : 61.5    Vital Signs: Temp: 98 F (36.7 C) (01/29 0456) BP: 126/74 mmHg (01/29 0456) Pulse Rate: 81  (01/29 0456)  Labs:  [name] Bell 11/06/11 0610 11/05/11 0545 11/04/11 0500  HGB -- -- --  HCT -- -- --  PLT -- -- --  APTT -- -- --  LABPROT 28.7* 23.9* 22.9*  INR 2.65* 2.10* 1.99*  HEPARINUNFRC -- -- --  CREATININE -- -- --  CKTOTAL -- -- --  CKMB -- -- --  TROPONINI -- -- --   Estimated Creatinine Clearance: 65.4 ml/min (by C-G formula based on Cr of 0.99).  Assessment:  77 yom s/p R TKA on day#7 of warfarin therapy. INR now therapeutic at 2.6 and trending upward. No bleeding complications noted.  Goal of Therapy:  INR 2-3   Plan:  Warfarin 2.5mg  today.    [name] Bell 11/06/2011,9:05 AM

## 2011-11-27 ENCOUNTER — Telehealth: Payer: Self-pay | Admitting: Internal Medicine

## 2011-11-27 MED ORDER — PIOGLITAZONE HCL 15 MG PO TABS: 15.0000 mg | ORAL_TABLET | Freq: Every day | ORAL | Status: DC

## 2011-11-27 NOTE — Telephone Encounter (Signed)
Give him enough to cover until he can get an appointment to see me

## 2011-11-27 NOTE — Telephone Encounter (Signed)
Sent med in per jcl 

## 2011-12-28 ENCOUNTER — Telehealth: Payer: Self-pay | Admitting: Internal Medicine

## 2011-12-28 MED ORDER — METFORMIN HCL 850 MG PO TABS: 850.0000 mg | ORAL_TABLET | Freq: Two times a day (BID) | ORAL | Status: DC

## 2011-12-28 NOTE — Telephone Encounter (Signed)
Pt is coming in Wednesday 3/27 for med check with shane

## 2011-12-28 NOTE — Telephone Encounter (Signed)
He needs an appointment with me. Don't let him run out but look at the last note

## 2012-01-02 ENCOUNTER — Encounter: Payer: Self-pay | Admitting: Medical

## 2012-01-02 ENCOUNTER — Ambulatory Visit (INDEPENDENT_AMBULATORY_CARE_PROVIDER_SITE_OTHER): Payer: BC Managed Care – PPO | Admitting: Medical

## 2012-01-02 VITALS — BP 100/60 | HR 68 | Temp 98.0°F | Resp 16 | Wt 185.0 lb

## 2012-01-02 DIAGNOSIS — E785 Hyperlipidemia, unspecified: Secondary | ICD-10-CM

## 2012-01-02 DIAGNOSIS — J45909 Unspecified asthma, uncomplicated: Secondary | ICD-10-CM

## 2012-01-02 DIAGNOSIS — I10 Essential (primary) hypertension: Secondary | ICD-10-CM

## 2012-01-02 DIAGNOSIS — E291 Testicular hypofunction: Secondary | ICD-10-CM

## 2012-01-02 DIAGNOSIS — E119 Type 2 diabetes mellitus without complications: Secondary | ICD-10-CM

## 2012-01-02 DIAGNOSIS — Z96659 Presence of unspecified artificial knee joint: Secondary | ICD-10-CM

## 2012-01-02 NOTE — Progress Notes (Signed)
Subjective:   HPI  [name] Bell is a 78 y.o. male who presents for medication check and routine followup on chronic disease. His last visit here was over a year ago. He is here today for recheck on diabetes, cholesterol, blood pressure, and labs although he is not fasting today.  He recently had a right total knee replacement, is finishing therapy this week, and is really done well getting back into the swing of things.  His exercise has not been as frequent since the knee surgery.  He subsequently has noticed his blood sugars running in the 130-140 range in the mornings whereas before the surgery was always under the 110s.  He was on testosterone therapy on AndroGel for about a year with no improvement. He would like to recheck a testosterone today though.  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  He reports that in the past he and Dr. Susann Givens had discussed prostate screening in the past, and he was advised that he no longer needed prostate screening givne his age. .  The following portions of the patient's history were reviewed and updated as appropriate: allergies, current medications, past family history, past medical history, past social history, past surgical history and problem list.  Past Medical History  Diagnosis Date  . Allergy     RHINITIS  . Chronic kidney disease     RENAL STONE  . Hypertension   . Hypogonadism male   . Hx of colonic polyps   . FHx: cardiovascular disease   . Seborrheic keratosis   . PONV (postoperative nausea and vomiting)     29 yrs ago; no problems since then  . Asthmatic bronchitis     seasonal  . Arthritis     osteoarthritis  . Cancer     skin cancer removed from back  . Diabetes mellitus     type 2 Niddm x 6 yrs    Allergies  Allergen Reactions  . Thorazine (Chlorpromazine) Other (See Comments)    Hallucinations. Patient does not want to take anymore.  . Sulfa Antibiotics Hives     Review of  Systems ROS reviewed and was negative other than noted in HPI or above.    Objective:   Physical Exam  General appearance: alert, no distress, WD/WN, white male walking with cane Oral cavity: MMM, no lesions Neck: supple, no lymphadenopathy, no thyromegaly, no masses Heart: RRR, normal S1, S2, no murmurs Lungs: CTA bilaterally, no wheezes, rhonchi, or rales Abdomen: +bs, soft, non tender, non distended, no masses, no hepatomegaly, no splenomegaly Pulses: 2+ symmetric   Assessment and Plan :     Encounter Diagnoses  Name Primary?  . Type II or unspecified type diabetes mellitus without mention of complication, not stated as uncontrolled Yes  . Essential hypertension, benign   . Hyperlipidemia   . Hypogonadism male   . S/P total knee replacement   . Reactive airway disease    Diabetes type 2-he has been compliant with medication, will return for fasting labs within week, advise he continue diabetic diet, trying to exercise were possible, check feet daily, check glucose routinely  Hypertension-controlled on current medication  Hyperlipidemia-return soon for fasting labs, and we will decrease his simvastatin to at least 40mg  pending labs due to recent concerns over no proven benefit for simvastatin 80 mg and increased risk of myalgias and elevated LFTs  Hypogonadism-prior one year of therapy without much improvement, recheck testosterone level today at his  request  Reactive airway disease-continue his current Advair  He is status post total knee replacement the right, doing well, and he will finish his physical therapy today

## 2012-01-08 ENCOUNTER — Ambulatory Visit (INDEPENDENT_AMBULATORY_CARE_PROVIDER_SITE_OTHER): Payer: BC Managed Care – PPO | Admitting: Medical

## 2012-01-08 ENCOUNTER — Encounter: Payer: Self-pay | Admitting: Medical

## 2012-01-08 VITALS — BP 120/70 | HR 68 | Temp 98.0°F | Resp 16 | Wt 187.0 lb

## 2012-01-08 DIAGNOSIS — E119 Type 2 diabetes mellitus without complications: Secondary | ICD-10-CM

## 2012-01-08 DIAGNOSIS — E785 Hyperlipidemia, unspecified: Secondary | ICD-10-CM

## 2012-01-08 DIAGNOSIS — E291 Testicular hypofunction: Secondary | ICD-10-CM

## 2012-01-08 LAB — LIPID PANEL
Cholesterol: 163 mg/dL (ref 0–200)
HDL: 50 mg/dL (ref >39)
Total CHOL/HDL Ratio: 3.3 Ratio
Triglycerides: 263 mg/dL — ABNORMAL HIGH (ref <150)
VLDL: 53 mg/dL — ABNORMAL HIGH (ref 0–40)

## 2012-01-08 LAB — CBC WITH DIFFERENTIAL/PLATELET
Basophils Relative: 1 % (ref 0–1)
HCT: 38.6 % — ABNORMAL LOW (ref 39.0–52.0)
Hemoglobin: 12.3 g/dL — ABNORMAL LOW (ref 13.0–17.0)
Lymphocytes Relative: 19 % (ref 12–46)
Lymphs Abs: 1.3 10*3/uL (ref 0.7–4.0)
Monocytes Absolute: 0.6 10*3/uL (ref 0.1–1.0)
Monocytes Relative: 9 % (ref 3–12)
Neutro Abs: 4.8 10*3/uL (ref 1.7–7.7)
Neutrophils Relative %: 68 % (ref 43–77)
RBC: 3.99 MIL/uL — ABNORMAL LOW (ref 4.22–5.81)
WBC: 7 10*3/uL (ref 4.0–10.5)

## 2012-01-08 LAB — COMPREHENSIVE METABOLIC PANEL
Albumin: 4.2 g/dL (ref 3.5–5.2)
BUN: 18 mg/dL (ref 6–23)
Calcium: 9.7 mg/dL (ref 8.4–10.5)
Chloride: 104 mEq/L (ref 96–112)
Creat: 0.98 mg/dL (ref 0.50–1.35)
Glucose, Bld: 141 mg/dL — ABNORMAL HIGH (ref 70–99)
Potassium: 4.7 mEq/L (ref 3.5–5.3)

## 2012-01-08 NOTE — Progress Notes (Signed)
Subjective:   HPI  [name] Bell is a 78 y.o. male who presents for fasting labs.   I saw him last week for medication check and routine follow up on chronic disease.  He is primarily here for labs, but he has questions about his medication.  He was reading about Actos and wants to stop this due to risks of bladder cancer.  Otherwise he notes fasting glucose recently elevated.  He was on testosterone therapy on AndroGel for about a year with no improvement. He would like to recheck a testosterone today though.  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  .  The following portions of the patient's history were reviewed and updated as appropriate: allergies, current medications, past family history, past medical history, past social history, past surgical history and problem list.  Past Medical History  Diagnosis Date  . Allergy     RHINITIS  . Chronic kidney disease     RENAL STONE  . Hypertension   . Hypogonadism male   . Hx of colonic polyps   . FHx: cardiovascular disease   . Seborrheic keratosis   . PONV (postoperative nausea and vomiting)     29 yrs ago; no problems since then  . Asthmatic bronchitis     seasonal  . Arthritis     osteoarthritis  . Cancer     skin cancer removed from back  . Diabetes mellitus     type 2 Niddm x 6 yrs    Allergies  Allergen Reactions  . Thorazine (Chlorpromazine) Other (See Comments)    Hallucinations. Patient does not want to take anymore.  . Sulfa Antibiotics Hives     Review of Systems ROS reviewed and was negative other than noted in HPI or above.    Objective:   Physical Exam  General appearance: alert, no distress, WD/WN, white male walking with cane Oral cavity: MMM, no lesions Neck: supple, no lymphadenopathy, no thyromegaly, no masses Heart: RRR, normal S1, S2, no murmurs Lungs: CTA bilaterally, no wheezes, rhonchi, or rales   Assessment and Plan :     Encounter Diagnoses  Name  Primary?  . Type II or unspecified type diabetes mellitus without mention of complication, not stated as uncontrolled Yes  . Hyperlipidemia   . Hypogonadism male    Diabetes type 2-he has been compliant with medication, labs today, discussed possible medication options and gettting off Actos given his concerns, advise he continue diabetic diet, trying to exercise were possible, check feet daily, check glucose routinely.   Discussed Actos and possible adverse effects as well as problems with other medications that he is taking. Pending labs, will switch to different medication instead of Advil  Hypertension-controlled on current medication  Hyperlipidemia-fasting labs today, and we will decrease his simvastatin to at least 40mg  pending labs due to recent concerns over no proven benefit for simvastatin 80 mg and increased risk of myalgias and elevated LFTs  Hypogonadism-prior one year of therapy without much improvement, recheck testosterone level today at his request

## 2012-01-09 ENCOUNTER — Other Ambulatory Visit: Payer: Self-pay | Admitting: Medical

## 2012-01-09 LAB — MICROALBUMIN / CREATININE URINE RATIO: Microalb, Ur: 1.8 mg/dL (ref 0.00–1.89)

## 2012-01-09 LAB — TESTOSTERONE: Testosterone: 183.09 ng/dL — ABNORMAL LOW (ref 300–890)

## 2012-01-09 MED ORDER — TESTOSTERONE 20.25 MG/1.25GM (1.62%) TD GEL: 2.0000 | Freq: Every day | TRANSDERMAL | Status: DC

## 2012-01-09 MED ORDER — METFORMIN HCL 1000 MG PO TABS: 1000.0000 mg | ORAL_TABLET | Freq: Two times a day (BID) | ORAL | Status: DC

## 2012-01-09 NOTE — Progress Notes (Signed)
Called in androgel 1.62% into [facility]

## 2012-02-11 ENCOUNTER — Encounter: Payer: Self-pay | Admitting: Medical

## 2012-02-11 ENCOUNTER — Ambulatory Visit (INDEPENDENT_AMBULATORY_CARE_PROVIDER_SITE_OTHER): Payer: Medicare Other | Admitting: Medical

## 2012-02-11 VITALS — BP 130/80 | HR 76 | Temp 98.0°F | Resp 16 | Wt 190.0 lb

## 2012-02-11 DIAGNOSIS — E119 Type 2 diabetes mellitus without complications: Secondary | ICD-10-CM | POA: Insufficient documentation

## 2012-02-11 DIAGNOSIS — R197 Diarrhea, unspecified: Secondary | ICD-10-CM

## 2012-02-11 DIAGNOSIS — T887XXA Unspecified adverse effect of drug or medicament, initial encounter: Secondary | ICD-10-CM

## 2012-02-11 DIAGNOSIS — E291 Testicular hypofunction: Secondary | ICD-10-CM

## 2012-02-11 DIAGNOSIS — T50905A Adverse effect of unspecified drugs, medicaments and biological substances, initial encounter: Secondary | ICD-10-CM

## 2012-02-11 LAB — PSA, MEDICARE: PSA: 0.74 ng/mL (ref <=4.00)

## 2012-02-11 MED ORDER — METFORMIN HCL 850 MG PO TABS: 850.0000 mg | ORAL_TABLET | Freq: Two times a day (BID) | ORAL | Status: AC

## 2012-02-11 NOTE — Progress Notes (Addendum)
Subjective:   HPI  [name] Bell is a 78 y.o. male who presents for 39mo recheck.  Last visit we discussed numerous issues.  He is here for f/u on Testosterone/hypogonadism.  Started back on Androgel last visit, seeing no improvement yet.   Has been on this prior.   He went up on Metformin to 1000mg  BID after stopping Actos, but having 6-7 diarrhea stools daily, belly cramping, not feeling that well.  Glucose numbers running ok though.   Last visit he did begin OTC Fish oil, also using iron OTC for anemia.  He notes prior colonoscopy about a year ago.   This is his last visit here.  He has to drive close to an hour to get here.   He is transferring his medical care to [facility] in [place] 6 minutes from his house.    No other c/o  The following portions of the patient's history were reviewed and updated as appropriate: allergies, current medications, past family history, past medical history, past social history, past surgical history and problem list.  Past Medical History  Diagnosis Date  . Allergy     RHINITIS  . Chronic kidney disease     RENAL STONE  . Hypertension   . Hypogonadism male   . Hx of colonic polyps   . FHx: cardiovascular disease   . Seborrheic keratosis   . PONV (postoperative nausea and vomiting)     29 yrs ago; no problems since then  . Asthmatic bronchitis     seasonal  . Arthritis     osteoarthritis  . Cancer     skin cancer removed from back  . Diabetes mellitus     type 2 Niddm x 6 yrs    Allergies  Allergen Reactions  . Thorazine (Chlorpromazine) Other (See Comments)    Hallucinations. Patient does not want to take anymore.  . Sulfa Antibiotics Hives     Review of Systems ROS reviewed and was negative other than noted in HPI or above.    Objective:   Physical Exam  General appearance: alert, no distress, WD/WN  Assessment and Plan :     Encounter Diagnoses  Name Primary?  . Hypogonadism male Yes  . Type II or  unspecified type diabetes mellitus without mention of complication, not stated as uncontrolled   . Adverse effect of drug/medicinal   . Diarrhea    Hypogonadism - labs today including PSA.   He will need to f/u with [facility] clinic, where he is transferring care for prostate exam.  Diabetes - not tolerating Metformin at 1000mg  since we stopped Actos and increased Metformin.  He will go back to Metformin 850mg  BID since he tolerated this.  Will need to recheck HgbA1C in 41mo, may need medication modification at that time.    He is not tolerating Metformin 1000mg  due to diarrhea.   We backed down dose today to 850mg , f/u in pending labs.   Wished him well as he transfers care closer to his home.    Spent approx 25 minutes reviewing prior labs, medication changes, and additional evaluation going forward.

## 2012-02-12 LAB — PROLACTIN: Prolactin: 5.1 ng/mL (ref 2.1–17.1)

## 2012-02-12 LAB — TESTOSTERONE, FREE, TOTAL, SHBG
Testosterone, Free: 48.4 pg/mL (ref 47.0–244.0)
Testosterone-% Free: 2.5 % (ref 1.6–2.9)

## 2012-02-12 LAB — LUTEINIZING HORMONE: LH: 0.2 m[IU]/mL — ABNORMAL LOW (ref 3.1–34.6)

## 2012-04-04 ENCOUNTER — Other Ambulatory Visit: Payer: Self-pay | Admitting: Family Medicine

## 2012-04-19 ENCOUNTER — Other Ambulatory Visit: Payer: Self-pay | Admitting: Family Medicine

## 2012-06-05 ENCOUNTER — Other Ambulatory Visit: Payer: Self-pay | Admitting: Family Medicine

## 2013-03-13 ENCOUNTER — Telehealth: Payer: Self-pay | Admitting: Family Medicine

## 2013-03-13 NOTE — Telephone Encounter (Signed)
Patient does not come here anymore due to the distance. He gets care some where closer to his home. CLS

## 2013-03-13 NOTE — Telephone Encounter (Signed)
Message copied by Janeice Robinson on Fri Mar 13, 2013  1:25 PM ------      Message from: Jac Canavan      Created: Wed Mar 11, 2013  5:17 PM       Needs yearly f/u ------

## 2013-04-03 ENCOUNTER — Encounter: Payer: Self-pay | Admitting: Medical

## 2013-05-20 ENCOUNTER — Other Ambulatory Visit: Payer: Self-pay | Admitting: Family Medicine

## 2013-07-01 ENCOUNTER — Other Ambulatory Visit: Payer: Self-pay | Admitting: Family Medicine

## 2014-03-23 ENCOUNTER — Encounter: Payer: Self-pay | Admitting: Internal Medicine

## 2014-03-23 LAB — HM DIABETES EYE EXAM

## 2014-07-08 ENCOUNTER — Telehealth: Payer: Self-pay | Admitting: Family Medicine

## 2014-07-08 ENCOUNTER — Other Ambulatory Visit: Payer: Self-pay | Admitting: Family Medicine

## 2014-07-08 NOTE — Telephone Encounter (Signed)
SHANE IS HE YOUR PT

## 2014-07-08 NOTE — Telephone Encounter (Signed)
Called pt home, cell and work left message patient needs diabetic follow up

## 2014-09-13 ENCOUNTER — Other Ambulatory Visit: Payer: Self-pay | Admitting: Family Medicine

## 2014-10-14 ENCOUNTER — Other Ambulatory Visit: Payer: Self-pay | Admitting: Orthopedic Surgery

## 2014-10-14 DIAGNOSIS — M25512 Pain in left shoulder: Secondary | ICD-10-CM

## 2014-10-21 ENCOUNTER — Ambulatory Visit
Admission: RE | Admit: 2014-10-21 | Discharge: 2014-10-21 | Disposition: A | Discharge status: Home / Self Care | Payer: Medicare Other | Source: Ambulatory Visit | Attending: Orthopedic Surgery | Admitting: Orthopedic Surgery

## 2014-10-21 DIAGNOSIS — M25512 Pain in left shoulder: Secondary | ICD-10-CM

## 2015-01-27 ENCOUNTER — Other Ambulatory Visit (HOSPITAL_COMMUNITY): Payer: Self-pay | Admitting: Orthopedic Surgery

## 2015-02-14 ENCOUNTER — Encounter (HOSPITAL_COMMUNITY)
Admission: RE | Admit: 2015-02-14 | Discharge: 2015-02-14 | Disposition: A | Discharge status: Home / Self Care | Payer: Medicare Other | Source: Ambulatory Visit | Attending: Orthopedic Surgery | Admitting: Orthopedic Surgery

## 2015-02-14 ENCOUNTER — Encounter (HOSPITAL_COMMUNITY): Payer: Self-pay

## 2015-02-14 DIAGNOSIS — Z01818 Encounter for other preprocedural examination: Secondary | ICD-10-CM | POA: Diagnosis not present

## 2015-02-14 DIAGNOSIS — M19012 Primary osteoarthritis, left shoulder: Secondary | ICD-10-CM | POA: Diagnosis not present

## 2015-02-14 DIAGNOSIS — Z01812 Encounter for preprocedural laboratory examination: Secondary | ICD-10-CM | POA: Diagnosis not present

## 2015-02-14 HISTORY — DX: Personal history of pneumonia (recurrent): Z87.01

## 2015-02-14 HISTORY — DX: Personal history of urinary calculi: Z87.442

## 2015-02-14 LAB — CBC
HCT: 45.1 % (ref 39.0–52.0)
Hemoglobin: 14.9 g/dL (ref 13.0–17.0)
MCH: 32.6 pg (ref 26.0–34.0)
MCHC: 33 g/dL (ref 30.0–36.0)
MCV: 98.7 fL (ref 78.0–100.0)
Platelets: 245 10*3/uL (ref 150–400)
RBC: 4.57 MIL/uL (ref 4.22–5.81)
RDW: 13.8 % (ref 11.5–15.5)
WBC: 7.2 10*3/uL (ref 4.0–10.5)

## 2015-02-14 LAB — BASIC METABOLIC PANEL
ANION GAP: 9 (ref 5–15)
BUN: 18 mg/dL (ref 6–20)
CHLORIDE: 100 mmol/L — AB (ref 101–111)
CO2: 27 mmol/L (ref 22–32)
CREATININE: 1.26 mg/dL — AB (ref 0.61–1.24)
Calcium: 9.7 mg/dL (ref 8.9–10.3)
GFR calc Af Amer: >60 mL/min (ref >60)
GFR, EST NON AFRICAN AMERICAN: 53 mL/min — AB (ref >60)
GLUCOSE: 179 mg/dL — AB (ref 70–99)
POTASSIUM: 5.1 mmol/L (ref 3.5–5.1)
Sodium: 136 mmol/L (ref 135–145)

## 2015-02-14 LAB — URINALYSIS, ROUTINE W REFLEX MICROSCOPIC
Bilirubin Urine: NEGATIVE
Glucose, UA: >1000 mg/dL — AB
HGB URINE DIPSTICK: NEGATIVE
KETONES UR: NEGATIVE mg/dL
LEUKOCYTES UA: NEGATIVE
Nitrite: NEGATIVE
PROTEIN: NEGATIVE mg/dL
Specific Gravity, Urine: 1.025 (ref 1.005–1.030)
Urobilinogen, UA: 0.2 mg/dL (ref 0.0–1.0)
pH: 6 (ref 5.0–8.0)

## 2015-02-14 LAB — URINE MICROSCOPIC-ADD ON

## 2015-02-14 LAB — SURGICAL PCR SCREEN
MRSA, PCR: NEGATIVE
STAPHYLOCOCCUS AUREUS: NEGATIVE

## 2015-02-14 NOTE — Pre-Procedure Instructions (Signed)
STYLIANOS STRADLING  02/14/2015   Your procedure is scheduled on:  May 17  Report to [facility] Admitting at 05:30 AM.  Call this number if you have problems the morning of surgery: [phone]   Remember:   Do not eat food or drink liquids after midnight.   Take these medicines the morning of surgery with A SIP OF WATER: Reglan (if needed), Propranolol,    STOP Vitamin C, Rose Hips, Chromium, Multiple Vitamins, Turmeric, Vitamin B12 May 10   STOP/ Do not take Aspirin, Aleve, Naproxen, Advil, Ibuprofen, Motrin, Vitamins, Herbs, or Supplements starting May 10   Do not wear jewelry, make-up or nail polish.  Do not wear lotions, powders, or perfumes. You may wear NOT deodorant.  Do not shave 48 hours prior to surgery. Men may shave face and neck.  Do not bring valuables to the hospital.  [facility] is not responsible for any belongings or valuables.               Contacts, dentures or bridgework may not be worn into surgery.  Leave suitcase in the car. After surgery it may be brought to your room.  For patients admitted to the hospital, discharge time is determined by your treatment team.               Special Instructions: [facility] - Preparing for Surgery  Before surgery, you can play an important role.  Because skin is not sterile, your skin needs to be as free of germs as possible.  You can reduce the number of germs on you skin by washing with CHG (chlorahexidine gluconate) soap before surgery.  CHG is an antiseptic cleaner which kills germs and bonds with the skin to continue killing germs even after washing.  Please DO NOT use if you have an allergy to CHG or antibacterial soaps.  If your skin becomes reddened/irritated stop using the CHG and inform your nurse when you arrive at Short Stay.  Do not shave (including legs and underarms) for at least 48 hours prior to the first CHG shower.  You may shave your face.  Please follow these instructions carefully:   1.   Shower with CHG Soap the night before surgery and the morning of Surgery.  2.  If you choose to wash your hair, wash your hair first as usual with your normal shampoo.  3.  After you shampoo, rinse your hair and body thoroughly to remove the shampoo.  4.  Use CHG as you would any other liquid soap.  You can apply CHG directly to the skin and wash gently with scrungie or a clean washcloth.  5.  Apply the CHG Soap to your body ONLY FROM THE NECK DOWN.  Do not use on open wounds or open sores.  Avoid contact with your eyes, ears, mouth and genitals (private parts).  Wash genitals (private parts) with your normal soap.  6.  Wash thoroughly, paying special attention to the area where your surgery will be performed.  7.  Thoroughly rinse your body with warm water from the neck down.  8.  DO NOT shower/wash with your normal soap after using and rinsing off the CHG Soap.  9.  Pat yourself dry with a clean towel.            10.  Wear clean pajamas.            11.  Place clean sheets on your bed the night of your  first shower and do not sleep with pets.  Day of Surgery  Do not apply any lotions the morning of surgery.  Please wear clean clothes to the hospital/surgery center.     Please read over the following fact sheets that you were given: Pain Booklet, Coughing and Deep Breathing and Surgical Site Infection Prevention

## 2015-02-14 NOTE — Progress Notes (Signed)
Patient reports shortness of breath with exertion for 3- 4 years without chest pain. Reports Harriet Butte is PCP had EKG done within last year. Dr. Verdie Mosher has seen him for pulmonology. Requested records.

## 2015-02-14 NOTE — Progress Notes (Signed)
   02/14/15 1031  OBSTRUCTIVE SLEEP APNEA  Have you ever been diagnosed with sleep apnea through a sleep study? No (negative study 5 years ago)  Do you snore loudly (loud enough to be heard through closed doors)?  1  Do you often feel tired, fatigued, or sleepy during the daytime? 1  Has anyone observed you stop breathing during your sleep? 0  Do you have, or are you being treated for high blood pressure? 1  BMI more than 35 kg/m2? 0  Age over 79 years old? 1  Neck circumference greater than 40 cm/16 inches? 1  Gender: 1  Obstructive Sleep Apnea Score 6

## 2015-02-15 LAB — URINE CULTURE
Colony Count: NO GROWTH
Culture: NO GROWTH

## 2015-02-17 ENCOUNTER — Other Ambulatory Visit (HOSPITAL_COMMUNITY): Payer: Self-pay | Admitting: Orthopedic Surgery

## 2015-02-21 MED ORDER — CEFAZOLIN SODIUM-DEXTROSE 2-3 GM-% IV SOLR: 2.0000 g | INTRAVENOUS | Status: DC

## 2015-02-22 ENCOUNTER — Inpatient Hospital Stay (HOSPITAL_COMMUNITY): Payer: Medicare Other | Admitting: Anesthesiology

## 2015-02-22 ENCOUNTER — Inpatient Hospital Stay (HOSPITAL_COMMUNITY): Payer: Medicare Other | Admitting: Vascular Surgery

## 2015-02-22 ENCOUNTER — Encounter (HOSPITAL_COMMUNITY)
Admission: RE | Disposition: A | Discharge status: Home Health | Payer: Self-pay | Source: Ambulatory Visit | Attending: Orthopedic Surgery

## 2015-02-22 ENCOUNTER — Inpatient Hospital Stay (HOSPITAL_COMMUNITY)
Admission: RE | Admit: 2015-02-22 | Discharge: 2015-02-24 | DRG: 483 | Disposition: A | Discharge status: Home Health | Payer: Medicare Other | Source: Ambulatory Visit | Attending: Orthopedic Surgery | Admitting: Orthopedic Surgery

## 2015-02-22 ENCOUNTER — Encounter (HOSPITAL_COMMUNITY): Payer: Self-pay | Admitting: *Deleted

## 2015-02-22 ENCOUNTER — Inpatient Hospital Stay (HOSPITAL_COMMUNITY): Payer: Medicare Other

## 2015-02-22 DIAGNOSIS — I10 Essential (primary) hypertension: Secondary | ICD-10-CM | POA: Diagnosis present

## 2015-02-22 DIAGNOSIS — E291 Testicular hypofunction: Secondary | ICD-10-CM | POA: Diagnosis present

## 2015-02-22 DIAGNOSIS — Z79891 Long term (current) use of opiate analgesic: Secondary | ICD-10-CM

## 2015-02-22 DIAGNOSIS — Z96651 Presence of right artificial knee joint: Secondary | ICD-10-CM | POA: Diagnosis present

## 2015-02-22 DIAGNOSIS — E119 Type 2 diabetes mellitus without complications: Secondary | ICD-10-CM | POA: Diagnosis present

## 2015-02-22 DIAGNOSIS — Z79899 Other long term (current) drug therapy: Secondary | ICD-10-CM

## 2015-02-22 DIAGNOSIS — Z888 Allergy status to other drugs, medicaments and biological substances status: Secondary | ICD-10-CM

## 2015-02-22 DIAGNOSIS — M19012 Primary osteoarthritis, left shoulder: Principal | ICD-10-CM | POA: Diagnosis present

## 2015-02-22 DIAGNOSIS — Z881 Allergy status to other antibiotic agents status: Secondary | ICD-10-CM | POA: Diagnosis not present

## 2015-02-22 HISTORY — PX: TOTAL SHOULDER ARTHROPLASTY: SHX126

## 2015-02-22 LAB — GLUCOSE, CAPILLARY
Glucose-Capillary: 163 mg/dL — ABNORMAL HIGH (ref 65–99)
Glucose-Capillary: 140 mg/dL — ABNORMAL HIGH (ref 65–99)
Glucose-Capillary: 156 mg/dL — ABNORMAL HIGH (ref 65–99)

## 2015-02-22 LAB — APTT: aPTT: 35 seconds (ref 24–37)

## 2015-02-22 LAB — PROTIME-INR
INR: 1.12 (ref 0.00–1.49)
PROTHROMBIN TIME: 14.6 s (ref 11.6–15.2)

## 2015-02-22 SURGERY — ARTHROPLASTY, SHOULDER, TOTAL
Anesthesia: General | Site: Shoulder | Laterality: Left

## 2015-02-22 MED ORDER — PHENYLEPHRINE HCL 10 MG/ML IJ SOLN
INTRAMUSCULAR | Status: AC
  Filled 2015-02-22: qty 1

## 2015-02-22 MED ORDER — HYDROMORPHONE HCL 1 MG/ML IJ SOLN: 0.5000 mg | INTRAMUSCULAR | Status: DC | PRN

## 2015-02-22 MED ORDER — NEOSTIGMINE METHYLSULFATE 10 MG/10ML IV SOLN
INTRAVENOUS | Status: AC
  Filled 2015-02-22: qty 1

## 2015-02-22 MED ORDER — VANCOMYCIN HCL IN DEXTROSE 1-5 GM/200ML-% IV SOLN
1000.0000 mg | Freq: Two times a day (BID) | INTRAVENOUS | Status: AC
  Administered 2015-02-22: 1000 mg via INTRAVENOUS
  Filled 2015-02-22: qty 200

## 2015-02-22 MED ORDER — FENTANYL CITRATE (PF) 100 MCG/2ML IJ SOLN
INTRAMUSCULAR | Status: DC | PRN
  Administered 2015-02-22 (×2): 50 ug via INTRAVENOUS

## 2015-02-22 MED ORDER — LINAGLIPTIN-METFORMIN HCL 2.5-500 MG PO TABS: 1.0000 | ORAL_TABLET | Freq: Two times a day (BID) | ORAL | Status: DC

## 2015-02-22 MED ORDER — OXYCODONE HCL 5 MG PO TABS: 5.0000 mg | ORAL_TABLET | Freq: Once | ORAL | Status: DC | PRN

## 2015-02-22 MED ORDER — PIOGLITAZONE HCL 30 MG PO TABS
30.0000 mg | ORAL_TABLET | Freq: Every day | ORAL | Status: DC
  Administered 2015-02-23 – 2015-02-24 (×2): 30 mg via ORAL
  Filled 2015-02-22 (×3): qty 1

## 2015-02-22 MED ORDER — ASPIRIN EC 325 MG PO TBEC
325.0000 mg | DELAYED_RELEASE_TABLET | Freq: Every day | ORAL | Status: DC
  Administered 2015-02-23 – 2015-02-24 (×2): 325 mg via ORAL
  Filled 2015-02-22 (×2): qty 1

## 2015-02-22 MED ORDER — VANCOMYCIN HCL IN DEXTROSE 1-5 GM/200ML-% IV SOLN
INTRAVENOUS | Status: AC
  Administered 2015-02-22: 1000 mg via INTRAVENOUS
  Filled 2015-02-22: qty 200

## 2015-02-22 MED ORDER — POTASSIUM CHLORIDE IN NACL 20-0.9 MEQ/L-% IV SOLN
INTRAVENOUS | Status: AC
  Administered 2015-02-22: 22:00:00 via INTRAVENOUS
  Filled 2015-02-22 (×2): qty 1000

## 2015-02-22 MED ORDER — MENTHOL 3 MG MT LOZG: 1.0000 | LOZENGE | OROMUCOSAL | Status: DC | PRN

## 2015-02-22 MED ORDER — LINAGLIPTIN 5 MG PO TABS
2.5000 mg | ORAL_TABLET | Freq: Two times a day (BID) | ORAL | Status: DC
  Administered 2015-02-23 – 2015-02-24 (×3): 2.5 mg via ORAL
  Filled 2015-02-22 (×3): qty 1

## 2015-02-22 MED ORDER — PHENYLEPHRINE HCL 10 MG/ML IJ SOLN
10.0000 mg | INTRAVENOUS | Status: DC | PRN
  Administered 2015-02-22: 20 ug/min via INTRAVENOUS

## 2015-02-22 MED ORDER — PRAVASTATIN SODIUM 20 MG PO TABS
20.0000 mg | ORAL_TABLET | Freq: Every day | ORAL | Status: DC
  Administered 2015-02-22 – 2015-02-23 (×2): 20 mg via ORAL
  Filled 2015-02-22 (×2): qty 1

## 2015-02-22 MED ORDER — SCOPOLAMINE 1 MG/3DAYS TD PT72
MEDICATED_PATCH | TRANSDERMAL | Status: DC | PRN
  Administered 2015-02-22: 1 via TRANSDERMAL

## 2015-02-22 MED ORDER — DEXAMETHASONE SODIUM PHOSPHATE 4 MG/ML IJ SOLN
INTRAMUSCULAR | Status: AC
  Filled 2015-02-22: qty 1

## 2015-02-22 MED ORDER — LACTATED RINGERS IV SOLN
INTRAVENOUS | Status: DC
  Administered 2015-02-22 (×4): via INTRAVENOUS

## 2015-02-22 MED ORDER — DOCUSATE SODIUM 100 MG PO CAPS
100.0000 mg | ORAL_CAPSULE | Freq: Two times a day (BID) | ORAL | Status: DC
  Administered 2015-02-22 – 2015-02-24 (×4): 100 mg via ORAL
  Filled 2015-02-22 (×4): qty 1

## 2015-02-22 MED ORDER — METOCLOPRAMIDE HCL 10 MG PO TABS: 10.0000 mg | ORAL_TABLET | Freq: Four times a day (QID) | ORAL | Status: DC | PRN

## 2015-02-22 MED ORDER — CEFAZOLIN SODIUM-DEXTROSE 2-3 GM-% IV SOLR
INTRAVENOUS | Status: DC | PRN
  Administered 2015-02-22 (×2): 2 g via INTRAVENOUS

## 2015-02-22 MED ORDER — PROPRANOLOL HCL 40 MG PO TABS
40.0000 mg | ORAL_TABLET | Freq: Every day | ORAL | Status: DC
  Administered 2015-02-22 – 2015-02-24 (×2): 40 mg via ORAL
  Filled 2015-02-22 (×3): qty 1

## 2015-02-22 MED ORDER — ADULT MULTIVITAMIN W/MINERALS CH
1.0000 | ORAL_TABLET | Freq: Every day | ORAL | Status: DC
  Administered 2015-02-23 – 2015-02-24 (×3): 1 via ORAL
  Filled 2015-02-22 (×4): qty 1

## 2015-02-22 MED ORDER — SODIUM CHLORIDE 0.9 % IV SOLN
1000.0000 mg | INTRAVENOUS | Status: DC | PRN
  Administered 2015-02-22: 1000 mg via INTRAVENOUS

## 2015-02-22 MED ORDER — MEPERIDINE HCL 25 MG/ML IJ SOLN: 6.2500 mg | INTRAMUSCULAR | Status: DC | PRN

## 2015-02-22 MED ORDER — CHLORHEXIDINE GLUCONATE 4 % EX LIQD
60.0000 mL | Freq: Once | CUTANEOUS | Status: DC
  Filled 2015-02-22: qty 60

## 2015-02-22 MED ORDER — BUPIVACAINE-EPINEPHRINE (PF) 0.5% -1:200000 IJ SOLN
INTRAMUSCULAR | Status: DC | PRN
  Administered 2015-02-22: 25 mL via PERINEURAL

## 2015-02-22 MED ORDER — HYDROCODONE-ACETAMINOPHEN 10-325 MG PO TABS
1.0000 | ORAL_TABLET | ORAL | Status: DC | PRN
  Administered 2015-02-22: 2 via ORAL
  Administered 2015-02-23 (×2): 1 via ORAL
  Administered 2015-02-23 – 2015-02-24 (×4): 2 via ORAL
  Filled 2015-02-22: qty 2
  Filled 2015-02-22: qty 1
  Filled 2015-02-22 (×4): qty 2
  Filled 2015-02-22: qty 1

## 2015-02-22 MED ORDER — ONDANSETRON HCL 4 MG/2ML IJ SOLN
INTRAMUSCULAR | Status: DC | PRN
  Administered 2015-02-22: 4 mg via INTRAVENOUS

## 2015-02-22 MED ORDER — FENTANYL CITRATE (PF) 250 MCG/5ML IJ SOLN
INTRAMUSCULAR | Status: AC
  Filled 2015-02-22: qty 5

## 2015-02-22 MED ORDER — PHENYLEPHRINE HCL 10 MG/ML IJ SOLN
INTRAMUSCULAR | Status: DC | PRN
  Administered 2015-02-22 (×7): 40 ug via INTRAVENOUS

## 2015-02-22 MED ORDER — CANAGLIFLOZIN 300 MG PO TABS
300.0000 mg | ORAL_TABLET | Freq: Every day | ORAL | Status: DC
  Administered 2015-02-23 – 2015-02-24 (×2): 300 mg via ORAL
  Filled 2015-02-22 (×3): qty 300

## 2015-02-22 MED ORDER — ROCURONIUM BROMIDE 50 MG/5ML IV SOLN
INTRAVENOUS | Status: AC
  Filled 2015-02-22: qty 1

## 2015-02-22 MED ORDER — INSULIN ASPART 100 UNIT/ML ~~LOC~~ SOLN
0.0000 [IU] | Freq: Three times a day (TID) | SUBCUTANEOUS | Status: DC
  Administered 2015-02-23: 3 [IU] via SUBCUTANEOUS
  Administered 2015-02-23 (×2): 5 [IU] via SUBCUTANEOUS

## 2015-02-22 MED ORDER — METOCLOPRAMIDE HCL 5 MG/ML IJ SOLN: 5.0000 mg | Freq: Three times a day (TID) | INTRAMUSCULAR | Status: DC | PRN

## 2015-02-22 MED ORDER — ONDANSETRON HCL 4 MG PO TABS: 4.0000 mg | ORAL_TABLET | Freq: Four times a day (QID) | ORAL | Status: DC | PRN

## 2015-02-22 MED ORDER — PROPOFOL 10 MG/ML IV BOLUS
INTRAVENOUS | Status: DC | PRN
  Administered 2015-02-22: 100 mg via INTRAVENOUS

## 2015-02-22 MED ORDER — ACETAMINOPHEN 325 MG PO TABS: 650.0000 mg | ORAL_TABLET | Freq: Four times a day (QID) | ORAL | Status: DC | PRN

## 2015-02-22 MED ORDER — ONDANSETRON HCL 4 MG/2ML IJ SOLN: 4.0000 mg | Freq: Four times a day (QID) | INTRAMUSCULAR | Status: DC | PRN

## 2015-02-22 MED ORDER — VANCOMYCIN HCL IN DEXTROSE 1-5 GM/200ML-% IV SOLN
1000.0000 mg | Freq: Once | INTRAVENOUS | Status: AC
  Administered 2015-02-22: 1000 mg via INTRAVENOUS

## 2015-02-22 MED ORDER — CHROMIUM 200 MCG PO TABS: 1.0000 | ORAL_TABLET | Freq: Every day | ORAL | Status: DC

## 2015-02-22 MED ORDER — ARTIFICIAL TEARS OP OINT
TOPICAL_OINTMENT | OPHTHALMIC | Status: AC
  Filled 2015-02-22: qty 3.5

## 2015-02-22 MED ORDER — NEOSTIGMINE METHYLSULFATE 10 MG/10ML IV SOLN
INTRAVENOUS | Status: DC | PRN
  Administered 2015-02-22: 3 mg via INTRAVENOUS

## 2015-02-22 MED ORDER — CEFAZOLIN SODIUM-DEXTROSE 2-3 GM-% IV SOLR
INTRAVENOUS | Status: AC
  Filled 2015-02-22: qty 50

## 2015-02-22 MED ORDER — SCOPOLAMINE 1 MG/3DAYS TD PT72
MEDICATED_PATCH | TRANSDERMAL | Status: AC
  Filled 2015-02-22: qty 1

## 2015-02-22 MED ORDER — MIDAZOLAM HCL 2 MG/2ML IJ SOLN
INTRAMUSCULAR | Status: AC
  Filled 2015-02-22: qty 2

## 2015-02-22 MED ORDER — OXYCODONE HCL 5 MG/5ML PO SOLN: 5.0000 mg | Freq: Once | ORAL | Status: DC | PRN

## 2015-02-22 MED ORDER — HYDROMORPHONE HCL 1 MG/ML IJ SOLN: 0.2500 mg | INTRAMUSCULAR | Status: DC | PRN

## 2015-02-22 MED ORDER — ROCURONIUM BROMIDE 100 MG/10ML IV SOLN
INTRAVENOUS | Status: DC | PRN
  Administered 2015-02-22: 10 mg via INTRAVENOUS
  Administered 2015-02-22: 50 mg via INTRAVENOUS
  Administered 2015-02-22 (×2): 10 mg via INTRAVENOUS

## 2015-02-22 MED ORDER — PROPOFOL 10 MG/ML IV BOLUS
INTRAVENOUS | Status: AC
  Filled 2015-02-22: qty 20

## 2015-02-22 MED ORDER — METFORMIN HCL 500 MG PO TABS
500.0000 mg | ORAL_TABLET | Freq: Two times a day (BID) | ORAL | Status: DC
  Administered 2015-02-23 – 2015-02-24 (×3): 500 mg via ORAL
  Filled 2015-02-22 (×3): qty 1

## 2015-02-22 MED ORDER — METOCLOPRAMIDE HCL 5 MG PO TABS: 5.0000 mg | ORAL_TABLET | Freq: Three times a day (TID) | ORAL | Status: DC | PRN

## 2015-02-22 MED ORDER — SUCCINYLCHOLINE CHLORIDE 20 MG/ML IJ SOLN
INTRAMUSCULAR | Status: AC
  Filled 2015-02-22: qty 1

## 2015-02-22 MED ORDER — ACETAMINOPHEN 650 MG RE SUPP: 650.0000 mg | Freq: Four times a day (QID) | RECTAL | Status: DC | PRN

## 2015-02-22 MED ORDER — 0.9 % SODIUM CHLORIDE (POUR BTL) OPTIME
TOPICAL | Status: DC | PRN
  Administered 2015-02-22 (×4): 1000 mL
  Administered 2015-02-22: 2000 mL
  Administered 2015-02-22 (×2): 1000 mL

## 2015-02-22 MED ORDER — GLYCOPYRROLATE 0.2 MG/ML IJ SOLN
INTRAMUSCULAR | Status: DC | PRN
  Administered 2015-02-22: 0.6 mg via INTRAVENOUS
  Administered 2015-02-22: 0.2 mg via INTRAVENOUS

## 2015-02-22 MED ORDER — MIDAZOLAM HCL 5 MG/5ML IJ SOLN
INTRAMUSCULAR | Status: DC | PRN
  Administered 2015-02-22 (×2): 1 mg via INTRAVENOUS

## 2015-02-22 MED ORDER — VITAMIN C 500 MG PO TABS
1000.0000 mg | ORAL_TABLET | Freq: Every day | ORAL | Status: DC
  Administered 2015-02-23 – 2015-02-24 (×2): 1000 mg via ORAL
  Filled 2015-02-22 (×2): qty 2

## 2015-02-22 MED ORDER — GLYCOPYRROLATE 0.2 MG/ML IJ SOLN
INTRAMUSCULAR | Status: AC
  Filled 2015-02-22: qty 3

## 2015-02-22 MED ORDER — VITAMIN B-12 1000 MCG PO TABS
1000.0000 ug | ORAL_TABLET | Freq: Every day | ORAL | Status: DC
  Administered 2015-02-23 – 2015-02-24 (×2): 1000 ug via ORAL
  Filled 2015-02-22 (×2): qty 1

## 2015-02-22 MED ORDER — PHENOL 1.4 % MT LIQD: 1.0000 | OROMUCOSAL | Status: DC | PRN

## 2015-02-22 MED ORDER — LISINOPRIL 10 MG PO TABS
10.0000 mg | ORAL_TABLET | Freq: Every day | ORAL | Status: DC
Start: 2015-02-23 — End: 2015-02-24
  Administered 2015-02-24: 10 mg via ORAL
  Filled 2015-02-22 (×2): qty 1

## 2015-02-22 SURGICAL SUPPLY — 68 items
BANDAGE ELASTIC 3 VELCRO ST LF (GAUZE/BANDAGES/DRESSINGS) ×3 IMPLANT
BANDAGE ELASTIC 4 VELCRO ST LF (GAUZE/BANDAGES/DRESSINGS) ×3 IMPLANT
BLADE SAW SGTL 13X75X1.27 (BLADE) ×3 IMPLANT
BONE CEMENT PALACOS R-G (Orthopedic Implant) ×3 IMPLANT
CAPT SHLDR TOTAL 2 ×3 IMPLANT
CEMENT BONE PALACOS R-G (Orthopedic Implant) ×1 IMPLANT
CLOSURE STERI-STRIP 1/2X4 (GAUZE/BANDAGES/DRESSINGS) ×1
CLSR STERI-STRIP ANTIMIC 1/2X4 (GAUZE/BANDAGES/DRESSINGS) ×2 IMPLANT
COVER SURGICAL LIGHT HANDLE (MISCELLANEOUS) ×3 IMPLANT
COVER TABLE BACK 60X90 (DRAPES) IMPLANT
DRAPE C-ARM 42X72 X-RAY (DRAPES) IMPLANT
DRAPE IMP U-DRAPE 54X76 (DRAPES) ×3 IMPLANT
DRAPE INCISE IOBAN 66X45 STRL (DRAPES) ×9 IMPLANT
DRAPE U-SHAPE 47X51 STRL (DRAPES) ×6 IMPLANT
DRSG AQUACEL AG ADV 3.5X10 (GAUZE/BANDAGES/DRESSINGS) ×3 IMPLANT
DRSG PAD ABDOMINAL 8X10 ST (GAUZE/BANDAGES/DRESSINGS) ×6 IMPLANT
DURAPREP 26ML APPLICATOR (WOUND CARE) ×3 IMPLANT
ELECT BLADE 4.0 EZ CLEAN MEGAD (MISCELLANEOUS) ×3
ELECT BLADE 6.5 EXT (BLADE) IMPLANT
ELECT REM PT RETURN 9FT ADLT (ELECTROSURGICAL) ×3
ELECTRODE BLDE 4.0 EZ CLN MEGD (MISCELLANEOUS) ×1 IMPLANT
ELECTRODE REM PT RTRN 9FT ADLT (ELECTROSURGICAL) ×1 IMPLANT
ENDOSTITCH 0 SINGLE 48 (SUTURE) ×3 IMPLANT
EVACUATOR 1/8 PVC DRAIN (DRAIN) IMPLANT
GAUZE SPONGE 4X4 12PLY STRL (GAUZE/BANDAGES/DRESSINGS) ×3 IMPLANT
GLOVE BIOGEL PI IND STRL 7.5 (GLOVE) ×1 IMPLANT
GLOVE BIOGEL PI IND STRL 8 (GLOVE) ×1 IMPLANT
GLOVE BIOGEL PI INDICATOR 7.5 (GLOVE) ×2
GLOVE BIOGEL PI INDICATOR 8 (GLOVE) ×2
GLOVE ECLIPSE 7.0 STRL STRAW (GLOVE) ×3 IMPLANT
GLOVE SURG ORTHO 8.0 STRL STRW (GLOVE) ×3 IMPLANT
GOWN STRL REUS W/ TWL LRG LVL3 (GOWN DISPOSABLE) ×2 IMPLANT
GOWN STRL REUS W/ TWL XL LVL3 (GOWN DISPOSABLE) ×1 IMPLANT
GOWN STRL REUS W/TWL LRG LVL3 (GOWN DISPOSABLE) ×4
GOWN STRL REUS W/TWL XL LVL3 (GOWN DISPOSABLE) ×2
KIT BASIN OR (CUSTOM PROCEDURE TRAY) ×3 IMPLANT
KIT ROOM TURNOVER OR (KITS) ×3 IMPLANT
MANIFOLD NEPTUNE II (INSTRUMENTS) ×3 IMPLANT
NDL SUT 6 .5 CRC .975X.05 MAYO (NEEDLE) ×1 IMPLANT
NEEDLE HYPO 25GX1X1/2 BEV (NEEDLE) IMPLANT
NEEDLE MAYO TAPER (NEEDLE) ×2
NS IRRIG 1000ML POUR BTL (IV SOLUTION) ×3 IMPLANT
PACK SHOULDER (CUSTOM PROCEDURE TRAY) ×3 IMPLANT
PACK UNIVERSAL I (CUSTOM PROCEDURE TRAY) ×3 IMPLANT
PAD ARMBOARD 7.5X6 YLW CONV (MISCELLANEOUS) ×6 IMPLANT
PIN HUMERAL STMN 3.2MMX9IN (INSTRUMENTS) ×3 IMPLANT
SLING ARM IMMOBILIZER LRG (SOFTGOODS) ×3 IMPLANT
SPONGE LAP 18X18 X RAY DECT (DISPOSABLE) ×6 IMPLANT
SPONGE LAP 4X18 X RAY DECT (DISPOSABLE) ×3 IMPLANT
SUCTION FRAZIER TIP 10 FR DISP (SUCTIONS) ×3 IMPLANT
SUPPORT WRAP ARM LG (MISCELLANEOUS) ×3 IMPLANT
SUT FIBERWIRE #2 38 T-5 BLUE (SUTURE) ×3
SUT MAXBRAID (SUTURE) ×24 IMPLANT
SUT PROLENE 3 0 PS 2 (SUTURE) ×6 IMPLANT
SUT VIC AB 0 CTB1 27 (SUTURE) ×6 IMPLANT
SUT VIC AB 1 CT1 27 (SUTURE) ×10
SUT VIC AB 1 CT1 27XBRD ANBCTR (SUTURE) ×5 IMPLANT
SUT VIC AB 2-0 CT1 27 (SUTURE) ×10
SUT VIC AB 2-0 CT1 TAPERPNT 27 (SUTURE) ×5 IMPLANT
SUT VIC AB 3-0 SH 27 (SUTURE) ×2
SUT VIC AB 3-0 SH 27X BRD (SUTURE) ×1 IMPLANT
SUTURE FIBERWR #2 38 T-5 BLUE (SUTURE) ×1 IMPLANT
SYR CONTROL 10ML LL (SYRINGE) ×3 IMPLANT
TOWEL OR 17X24 6PK STRL BLUE (TOWEL DISPOSABLE) ×3 IMPLANT
TOWEL OR 17X26 10 PK STRL BLUE (TOWEL DISPOSABLE) ×3 IMPLANT
TRAY FOLEY BAG SILVER LF 16FR (CATHETERS) IMPLANT
WATER STERILE IRR 1000ML POUR (IV SOLUTION) ×3 IMPLANT
YANKAUER SUCT BULB TIP NO VENT (SUCTIONS) ×3 IMPLANT

## 2015-02-22 NOTE — OR Nursing (Deleted)
Pt ambulated to BR with maximum assistance of 1.  Pt with poor balance and shuffled gait.  Unsuccessful attempt for a bowel movement.

## 2015-02-22 NOTE — Brief Op Note (Signed)
02/22/2015  2:01 PM  PATIENT:  Lady Deutscher  81 y.o. male  PRE-OPERATIVE DIAGNOSIS:  LEFT SHOULDER OSTEOARTHRITIS  POST-OPERATIVE DIAGNOSIS:  LEFT SHOULDER OSTEOARTHRITIS  PROCEDURE:  Procedure(s): LEFT TOTAL SHOULDER ARTHROPLASTY  SURGEON:  Surgeon(s): Meredith Pel, MD  ASSISTANT: carla bethune rnfa  ANESTHESIA:   general  EBL: 400 ml    Total I/O In: 5000 [I.V.:5000] Out: 1000 [Urine:650; Blood:350]  BLOOD ADMINISTERED: none  DRAINS: none   LOCAL MEDICATIONS USED:  none  SPECIMEN:  No Specimen  COUNTS:  YES  TOURNIQUET:  * No tourniquets in log *  DICTATION: .Other Dictation: Dictation Number [id number]  PLAN OF CARE: Admit to inpatient   PATIENT DISPOSITION:  PACU - hemodynamically stable

## 2015-02-22 NOTE — Anesthesia Procedure Notes (Addendum)
Anesthesia Regional Block:  Interscalene brachial plexus block  Pre-Anesthetic Checklist: ,, timeout performed, Correct Patient, Correct Site, Correct Laterality, Correct Procedure, Correct Position, site marked, Risks and benefits discussed,  Surgical consent,  Pre-op evaluation,  At surgeon's request and post-op pain management  Laterality: Left and Upper  Prep: chloraprep       Needles:  Injection technique: Single-shot  Needle Type: Echogenic Needle     Needle Length: 5cm 5 cm Needle Gauge: 21 and 21 G    Additional Needles:  Procedures: ultrasound guided (picture in chart) Interscalene brachial plexus block Narrative:  Start time: 02/22/2015 7:12 AM End time: 02/22/2015 8:19 AM Injection made incrementally with aspirations every 5 mL.  Performed by: Personally  Anesthesiologist: CREWS, DAVID   Procedure Name: Intubation Date/Time: 02/22/2015 7:47 AM Performed by: Gershon Mussel, Jatasia Gundrum Pre-anesthesia Checklist: Patient identified, Patient being monitored, Timeout performed, Emergency Drugs available and Suction available Patient Re-evaluated:Patient Re-evaluated prior to inductionOxygen Delivery Method: Circle System Utilized Preoxygenation: Pre-oxygenation with 100% oxygen Intubation Type: IV induction Ventilation: Mask ventilation without difficulty Laryngoscope Size: Miller and 2 Grade View: Grade I Tube type: Oral Tube size: 7.0 mm Number of attempts: 1 Airway Equipment and Method: Stylet Placement Confirmation: ETT inserted through vocal cords under direct vision,  positive ETCO2 and breath sounds checked- equal and bilateral Secured at: 21 cm Tube secured with: Tape Dental Injury: Teeth and Oropharynx as per pre-operative assessment

## 2015-02-22 NOTE — Progress Notes (Signed)
Per phone call with Dr. Marlou Sa give patient vancomycin 1 gram stat.

## 2015-02-22 NOTE — Transfer of Care (Signed)
Immediate Anesthesia Transfer of Care Note  Patient: [name] Bell  Procedure(s) Performed: Procedure(s): LEFT TOTAL SHOULDER ARTHROPLASTY (Left)  Patient Location: PACU  Anesthesia Type:General  Level of Consciousness: sedated, patient cooperative and responds to stimulation  Airway & Oxygen Therapy: Patient Spontanous Breathing and Patient connected to face mask oxygen  Post-op Assessment: Report given to RN, Post -op Vital signs reviewed and stable and Patient moving all extremities X 4  Post vital signs: Reviewed and stable  Last Vitals:  Filed Vitals:   02/22/15 0626  BP: 120/78  Pulse: 53  Temp: 36.8 C  Resp: 18    Complications: No apparent anesthesia complications

## 2015-02-22 NOTE — H&P (Signed)
[name] Bell is an 81 y.o. male.   Chief Complaint: left shoulder pain HPI: [name] Bell is a 81 year old patient with left shoulder pain. He reports night pain and rest pain and pain with activities of daily living. MRI scan CT scan show intact rotator cuff with some tendinosis but definite glenohumeral arthritis. He is failed conservative measures and presents now for operative management after expiration risk and benefits  Past Medical History  Diagnosis Date  . Allergy     RHINITIS  . Hypertension   . Hypogonadism male   . Hx of colonic polyps   . FHx: cardiovascular disease   . Seborrheic keratosis   . Asthmatic bronchitis     seasonal  . Arthritis     osteoarthritis  . Cancer     skin cancer removed from back  . Diabetes mellitus     type 2 Niddm x 6 yrs  . PONV (postoperative nausea and vomiting)     reports has a difficult time coming out of  . History of kidney stones   . History of pneumonia     reports d/t bronchitis    Past Surgical History  Procedure Laterality Date  . Cholecystectomy  2010    GALL BLADDER  . Knee cartilage surgery  2009    Right knee  . Hernia repair      during cholecystectomy  . Knee arthroplasty  10/30/2011    Procedure: COMPUTER ASSISTED TOTAL KNEE ARTHROPLASTY;  Surgeon: Meredith Pel, MD;  Location: [facility];  Service: Orthopedics;  Laterality: Right;  right total knee arthroplasty  . Shoulder arthroscopy Right     History reviewed. No pertinent family history. Social History:  reports that he has never smoked. He has never used smokeless tobacco. He reports that he drinks about 1.8 oz of alcohol per week. He reports that he does not use illicit drugs.  Allergies:  Allergies  Allergen Reactions  . Thorazine [Chlorpromazine] Other (See Comments)    Hallucinations. Patient does not want to take anymore.  . Sulfa Antibiotics Hives    Medications Prior to Admission  Medication Sig Dispense Refill  . Ascorbic Acid (VITAMIN C) 1000 MG  tablet Take 1,000 mg by mouth daily. With Rose hips    . canagliflozin (INVOKANA) 300 MG TABS tablet Take 300 mg by mouth daily before breakfast.    . Chromium 200 MCG TABS Take 1 tablet by mouth daily.    . Lancets (ONETOUCH ULTRASOFT) lancets USE TO CHECK BLOOD SUGAR TWICE DAILY 100 each 2  . Linagliptin-Metformin HCl 2.5-500 MG TABS Take 1 tablet by mouth 2 (two) times daily.    Marland Kitchen lisinopril (PRINIVIL,ZESTRIL) 10 MG tablet Take 10 mg by mouth daily.      . Multiple Vitamins-Minerals (MULTIVITAMIN WITH MINERALS) tablet Take 1 tablet by mouth daily.      . ONE TOUCH ULTRA TEST test strip USE AS DIRECTED TWICE DAILY 100 each 11  . pioglitazone (ACTOS) 30 MG tablet Take 30 mg by mouth daily.    . pravastatin (PRAVACHOL) 20 MG tablet Take 20 mg by mouth daily.    . propranolol (INDERAL) 40 MG tablet Take 40 mg by mouth daily.    . TURMERIC PO Take 800 mg by mouth daily.    . vitamin B-12 (CYANOCOBALAMIN) 1000 MCG tablet Take 1,000 mcg by mouth daily.      Marland Kitchen acetaminophen (TYLENOL) 500 MG tablet Take 500 mg by mouth as needed (takes with hydrocodone).    Marland Kitchen HYDROcodone-acetaminophen (  NORCO/VICODIN) 5-325 MG per tablet Take 1 tablet by mouth at bedtime as needed for moderate pain (takes with plain tylenol).    . metoCLOPramide (REGLAN) 10 MG tablet Take 10 mg by mouth every 6 (six) hours as needed (Hiccups).    . simvastatin (ZOCOR) 80 MG tablet TAKE 1 TABLET BY MOUTH EVERY MONDAY,    WEDNESDAY, & FRIDAY (Patient not taking: Reported on 02/10/2015) 30 tablet 2  . Testosterone (ANDROGEL) 20.25 MG/1.25GM (1.62%) GEL Place 2 Squirts onto the skin daily. (Patient not taking: Reported on 02/10/2015) 88 g 2    No results found for this or any previous visit (from the past 48 hour(s)). No results found.  Review of Systems  Constitutional: Negative.   HENT: Negative.   Eyes: Negative.   Respiratory: Negative.   Cardiovascular: Negative.   Gastrointestinal: Negative.   Genitourinary: Negative.    Musculoskeletal: Positive for joint pain.  Skin: Negative.   Neurological: Negative.   Endo/Heme/Allergies: Negative.   Psychiatric/Behavioral: Negative.     Blood pressure 120/78, pulse 53, temperature 98.3 F (36.8 C), temperature source Oral, resp. rate 18, height 5\' 5"  (1.651 m), weight 82.101 kg (181 lb), SpO2 97 %. Physical Exam  Constitutional: He appears well-developed.  HENT:  Head: Normocephalic.  Eyes: Pupils are equal, round, and reactive to light.  Neck: Normal range of motion.  Cardiovascular: Normal rate.   Respiratory: Effort normal.  Neurological: He is alert.  Skin: Skin is warm.  Psychiatric: He has a normal mood and affect.   examination of the left shoulder demonstrates external rotation at 15 of abduction to about 35 isolated glenohumeral abduction is about 65 forward flexion is approximately 110 rotator cuff strength is intact and per spray to supraspinous subscap muscle testing skin is intact in the shoulder girdle region on the left-hand side neck range of motion is full motor sensory function to the hand is intact radial pulses intact the left-hand side  Assessment/Plan Impression is left shoulder end-stage glenohumeral arthritis plan left total shoulder replacement risk and benefits discussed including but not limited to infection nerve vessel damage instability need for more surgery as well as incomplete pain relief. Patient understands risk and benefits and wished to proceed with surgical intervention all questions answered  DEAN,GREGORY SCOTT 02/22/2015, 6:27 AM

## 2015-02-22 NOTE — Anesthesia Preprocedure Evaluation (Signed)
Anesthesia Evaluation  Patient identified by MRN, date of birth, ID band Patient awake    Reviewed: Allergy & Precautions, NPO status , Patient's Chart, lab work & pertinent test results  History of Anesthesia Complications (+) PONV  Airway Mallampati: III  TM Distance: >3 FB Neck ROM: Full    Dental  (+) Teeth Intact, Dental Advisory Given   Pulmonary asthma ,  breath sounds clear to auscultation        Cardiovascular hypertension (Beta blocker not given due to low HR), Pt. on medications and Pt. on home beta blockers Rhythm:Regular Rate:Normal     Neuro/Psych    GI/Hepatic   Endo/Other  diabetes, Well Controlled, Type 2, Oral Hypoglycemic Agents  Renal/GU      Musculoskeletal   Abdominal   Peds  Hematology   Anesthesia Other Findings   Reproductive/Obstetrics                             Anesthesia Physical Anesthesia Plan  ASA: III  Anesthesia Plan: General   Post-op Pain Management: MAC Combined w/ Regional for Post-op pain   Induction: Intravenous  Airway Management Planned: Oral ETT  Additional Equipment:   Intra-op Plan:   Post-operative Plan: Extubation in OR  Informed Consent: I have reviewed the patients History and Physical, chart, labs and discussed the procedure including the risks, benefits and alternatives for the proposed anesthesia with the patient or authorized representative who has indicated his/her understanding and acceptance.   Dental advisory given  Plan Discussed with: CRNA, Anesthesiologist and Surgeon  Anesthesia Plan Comments:         Anesthesia Quick Evaluation

## 2015-02-22 NOTE — OR Nursing (Signed)
Pt ambulated to the BR with maximum assistance of one.  Pt with poor balance and shuffled gait.  Unsuccessful attempt at a bowel movement.

## 2015-02-22 NOTE — Plan of Care (Signed)
Problem: Consults Goal: Diagnosis - Shoulder Surgery Outcome: Completed/Met Date Met:  02/22/15 Total Shoulder Arthroplasty left

## 2015-02-22 NOTE — Anesthesia Postprocedure Evaluation (Signed)
  Anesthesia Post-op Note  Patient: [name] Bell  Procedure(s) Performed: Procedure(s): LEFT TOTAL SHOULDER ARTHROPLASTY (Left)  Patient Location: PACU  Anesthesia Type:General and block  Level of Consciousness: awake and alert   Airway and Oxygen Therapy: Patient Spontanous Breathing  Post-op Pain: none  Post-op Assessment: Post-op Vital signs reviewed, Patient's Cardiovascular Status Stable and Respiratory Function Stable  Post-op Vital Signs: Reviewed  Filed Vitals:   02/22/15 1445  BP: 106/53  Pulse: 57  Temp:   Resp: 20    Complications: No apparent anesthesia complications

## 2015-02-23 ENCOUNTER — Encounter (HOSPITAL_COMMUNITY): Payer: Self-pay | Admitting: General Practice

## 2015-02-23 LAB — URINE MICROSCOPIC-ADD ON

## 2015-02-23 LAB — GLUCOSE, CAPILLARY
GLUCOSE-CAPILLARY: 210 mg/dL — AB (ref 65–99)
GLUCOSE-CAPILLARY: 209 mg/dL — AB (ref 65–99)
Glucose-Capillary: 194 mg/dL — ABNORMAL HIGH (ref 65–99)

## 2015-02-23 LAB — URINALYSIS, ROUTINE W REFLEX MICROSCOPIC
Bilirubin Urine: NEGATIVE
Glucose, UA: >1000 mg/dL — AB
Ketones, ur: 15 mg/dL — AB
LEUKOCYTES UA: NEGATIVE
Nitrite: NEGATIVE
Protein, ur: NEGATIVE mg/dL
Specific Gravity, Urine: 1.038 — ABNORMAL HIGH (ref 1.005–1.030)
UROBILINOGEN UA: 0.2 mg/dL (ref 0.0–1.0)
pH: 5.5 (ref 5.0–8.0)

## 2015-02-23 LAB — CBC
HCT: 35.6 % — ABNORMAL LOW (ref 39.0–52.0)
Hemoglobin: 11.9 g/dL — ABNORMAL LOW (ref 13.0–17.0)
MCH: 32.5 pg (ref 26.0–34.0)
MCHC: 33.4 g/dL (ref 30.0–36.0)
MCV: 97.3 fL (ref 78.0–100.0)
Platelets: 213 10*3/uL (ref 150–400)
RBC: 3.66 MIL/uL — AB (ref 4.22–5.81)
RDW: 14 % (ref 11.5–15.5)
WBC: 8.6 10*3/uL (ref 4.0–10.5)

## 2015-02-23 LAB — BASIC METABOLIC PANEL
ANION GAP: 10 (ref 5–15)
BUN: 19 mg/dL (ref 6–20)
CALCIUM: 8.5 mg/dL — AB (ref 8.9–10.3)
CHLORIDE: 104 mmol/L (ref 101–111)
CO2: 20 mmol/L — AB (ref 22–32)
CREATININE: 1.33 mg/dL — AB (ref 0.61–1.24)
GFR calc Af Amer: 57 mL/min — ABNORMAL LOW (ref >60)
GFR calc non Af Amer: 50 mL/min — ABNORMAL LOW (ref >60)
GLUCOSE: 223 mg/dL — AB (ref 65–99)
Potassium: 4.4 mmol/L (ref 3.5–5.1)
Sodium: 134 mmol/L — ABNORMAL LOW (ref 135–145)

## 2015-02-23 NOTE — Care Management Note (Signed)
Case Management Note  Patient Details  Name: ART LEVAN MRN: [id number] Date of Birth: 29-Aug-1934  Subjective/Objective:      81 yr old male admitted with arthritis of the right shoulder. Patient underwent a right total shoulder arthroplasty.               Action/Plan:  Patient was preoperatively setup with [facility], no changes.   Expected Discharge Date: 02/23/15                 Expected Discharge Plan:    Home with [facility]  n-House Referral:  NA  Discharge planning Services  CM Consult  Post Acute Care Choice:  Home Health Choice offered to:  Patient  DME Arranged:  CPM DME Agency:  [employer]  HH Arranged:  PT, OT HH Agency:  [facility]  Status of Service:  Completed, signed off  Medicare Important Message Given:  N/A - LOS <3 / Initial given by admissions Date Medicare IM Given:    Medicare IM give by:    Date Additional Medicare IM Given:    Additional Medicare Important Message give by:     If discussed at [place] of Stay Meetings, dates discussed:    Additional Comments:  Ninfa Meeker, RN 02/23/2015, 10:29 AM

## 2015-02-23 NOTE — Op Note (Signed)
NAME:  [name] Bell, [name] Bell NO.:  [id number]  MEDICAL RECORD NO.:  [id number]  LOCATION:  5N12C                        FACILITY:  [facility]  PHYSICIAN:  Anderson Malta, M.D.    DATE OF BIRTH:  06-Jul-1934  DATE OF PROCEDURE: DATE OF DISCHARGE:                              OPERATIVE REPORT   PREOPERATIVE DIAGNOSIS:  Left shoulder arthritis.  POSTOPERATIVE DIAGNOSIS:  Left shoulder arthritis.  PROCEDURE:  Left total shoulder replacement.  SURGEON:  Anderson Malta, M.D.  ASSISTANT:  Laure Kidney, RNFA  INDICATIONS:  [name] Bell is a 81 year old patient with end-stage shoulder arthritis, presents for operative management after explanation of risks and benefits of total shoulder replacement.  He has had failure of conservative measures.  IMPLANTS:  Biomet size 8 Press-Fit mini stem placed in approximately 30 degrees of retroversion with 36 x 24-mm offset Cobalt Chrome head, size small glenoid cemented with Press-Fit central peg.  PROCEDURE IN DETAIL:  The patient was brought to the operating room where general anesthetic was induced.  Preoperative antibiotics were administered.  Time-out was called.  The patient was placed in the beach- chair position with the head in neutral position.  Left arm, shoulder and hand were prescrubbed with alcohol and Betadine, allowed to air dry, prepped with DuraPrep solution and draped in a sterile manner.  Draped freely preoperatively with the scapula stabilized.  The patient only had about 10 degrees of external rotation.  Forward flexion was about 100, also glenohumeral abduction was about 65-70 degrees with scapula stabilized.  Shoulder stability was good.  After calling time-out, deltopectoral approach was made, skin and subcutaneous tissue were sharply divided.  Incision was made just from the inferior aspect of the clavicles just lateral to the coracoid process extending 2-3 cm lateral to the axillary crease.  Skin and subcutaneous  tissue were sharply divided.  Cephalic vein was identified and mobilized medially.  The Cobalt retractor was placed.  Adhesions were released between the deltoid and the humeral head.  The biceps was already auto-tenodesed. Bicipital groove was identified.  Several sutures were placed into the biceps tendon to the pectoralis tendon.  At this time, three sisters artery and two veins were identified and ligated with a 2-0 Vicryl suture ligature.  Rotator interval was then opened.  Rotator interval was then released all way to the glenoid.  At this time, the patient had very significant tendinosis of the supraspinatus tendon.  Partial tearing was present.  Subscapularis was identified.  Axillary nerve was then palpated under surface of the musculotendinous junction coursing underneath the glenoid.  This was protected at all times during the remaining portion of the case.  After opening the rotator interval, tenodesed the biceps tendon.  The subscap was then released obliquely in line with the humeral head down to along its length.  This was tagged with MaxBraid suture x2.  Left about a 5-6 mm cuff of tissue for reapproximation.  At this time, the anterior capsule was excised from the 6 o'clock position to the 12 o'clock position with the axillary nerve protected.  Very significant redundant capsule was present.  This allowed some improved excursion of the subscap.  At this  time, approximately 1 cm in line with the bicipital groove, the canal was entered initially with a rongeur and then with a starting awl. Sequentially reamed up to about size 8 with a good chatter achieved. The head was then cut in 30 degrees of retroversion with cuff protected. Following this, the canal was broached to size 8 and a cap was placed. Self-retaining spreading retractor was then placed between the glenoid and the humerus.  This allowed for the capsule replaced on tension, which was then released in order to  facilitate exposure to the glenoid. This was done with the axillary nerve protected.  At this time, the glenoid was exposed by placing batwing retractors first posterior- inferior and then moving with Hohmann retractor up above the biceps tendon, up above the superior labrum and anterior retractor anteriorly. Center portion of the glenoid was marked.  Central guidepin was placed. It appeared that the glenoid size was between medium and small.  There was no real significant deformity or posterior wear, but the patient's bone was exceedingly hard.  Reaming was performed down to until we got some bleeding subchondral bone.  Then, the central peg was placed.  The central peg did escape of all from about the 10 o'clock to 12 o'clock position.  This was bone grafted.  Three other pegs were then drilled and a small trial glenoid was placed.  This had excellent Press-Fit and it was difficult to remove even with an osteotome.  At this time, with the trial glenoid in place, the substantial head sizes were trialed.  It was point to the glenoid at 40 degrees of external rotation, 50% anterior-posterior translation without dislocation and actually had 60 degrees of abduction and internal rotation parallel to the torso.  The head size was chosen.  It was concentric head and for coverage was eccentric posterolaterally.  The trial components were removed.  True components were then cemented/Press-Fit in terms of the glenoid with excellent fixation achieved, good coverage was achieved.  Next, the humerus was placed into a Press-Fit position in 30 degrees of retroversion.  Then, the true head was placed and the same stability parameters were maintained.  Axillary nerve again palpated and found to be intact.  Thorough irrigation was performed.  The subscap was then repaired using 7 MaxBraid sutures.  It should be noted that two sutures holes were placed into the humeral shaft just lateral to the  lesser tuberosity in order to facilitate suture placement and subscap repair. This was done prior to placement of the final prosthesis.  Good subscap repair was achieved.  The patient had about 45 degrees of external rotation following the subscap repair.  At this time, thorough irrigation was again performed.  The incision was then closed using interrupted inverted 0 Vicryl suture, 2-0 Vicryl suture and a running 3- 0 Monocryl.  Carla Bethune's assistance required at all times during the case for retraction.  Her assistance was medical necessity.     Anderson Malta, M.D.     GSD/MEDQ  D:  02/22/2015  T:  02/23/2015  Job:  [id number]

## 2015-02-23 NOTE — Evaluation (Addendum)
Occupational Therapy Evaluation Patient Details Name: [name] Bell MRN: [id number] DOB: [birth year] Today's Date: 02/23/2015    History of Present Illness Pt is a 81 y.o. Male s/p L TSA on 02/22/15.    Clinical Impression   PTA pt lived at home and was independent with ADLs. Pt currently limited by decreased ROM and pain from TSA as well as decreased balance. Pt presents as a high fall risk and will not have 24/7 assist at home. Pt will benefit from acute OT to address UE impairment and safety with ADLs. Recommend PT consult to address balance deficits. Pt will benefit from [facility] at discharge.     Follow Up Recommendations  Home health OT;Supervision/Assistance - 24 hour    Equipment Recommendations  None recommended by OT    Recommendations for Other Services PT consult     Precautions / Restrictions Precautions Precautions: Shoulder;Fall Type of Shoulder Precautions: Passive Protocol (PROM FF 0-90, ABD 0-60, ER 0-30); AROM elbow, wrist, hand; Pendulums Shoulder Interventions: Shoulder sling/immobilizer;At all times;Off for dressing/bathing/exercises Precaution Booklet Issued: Yes (comment) Precaution Comments: Educated pt on incorporating precautions into ADLs.  Required Braces or Orthoses: Sling Restrictions Weight Bearing Restrictions: Yes LUE Weight Bearing: Non weight bearing      Mobility Bed Mobility               General bed mobility comments: Pt sitting EOB when OT arrived.   Transfers Overall transfer level: Needs assistance Equipment used: 1 person hand held assist Transfers: Sit to/from Stand Sit to Stand: Min assist         General transfer comment: Min A to balance upon standing.          ADL Overall ADL's : Needs assistance/impaired Eating/Feeding: Set up;Sitting Eating/Feeding Details (indicate cue type and reason): assist to open containers and cut food Grooming: Set up;Sitting   Upper Body Bathing: Minimal assitance;Sitting    Lower Body Bathing: Minimal assistance;Sit to/from stand   Upper Body Dressing : Maximal assistance;Sitting Upper Body Dressing Details (indicate cue type and reason): including sling Lower Body Dressing: Moderate assistance;Sit to/from stand   Toilet Transfer: Minimal assistance;Ambulation;Comfort height toilet (hand held assist)   Toileting- Clothing Manipulation and Hygiene: Min guard;Sit to/from stand (with use of grab bar)       Functional mobility during ADLs: Minimal assistance (hand held assist) General ADL Comments: Pt is mildly unsteady, possibly related to pain medications. Educated pt on therapeutic exercises and incorporating precautions into ADLs.      Vision Additional Comments: No change from baseline   Perception Perception Perception Tested?: No   Praxis Praxis Praxis tested?: Not tested    Pertinent Vitals/Pain Pain Assessment: 0-10 Pain Score: 3  Pain Location: Left shoulder Pain Descriptors / Indicators: Aching Pain Intervention(s): Limited activity within patient's tolerance;Monitored during session;Repositioned;Premedicated before session     Hand Dominance Right   Extremity/Trunk Assessment Upper Extremity Assessment Upper Extremity Assessment: LUE deficits/detail LUE Deficits / Details: L TSA; see shoulder protocol above LUE: Unable to fully assess due to immobilization;Unable to fully assess due to pain LUE Coordination: decreased gross motor   Lower Extremity Assessment Lower Extremity Assessment: Defer to PT evaluation   Cervical / Trunk Assessment Cervical / Trunk Assessment: Normal   Communication Communication Communication: No difficulties   Cognition Arousal/Alertness: Awake/alert (mildly loopy from medications) Behavior During Therapy: WFL for tasks assessed/performed Overall Cognitive Status: Within Functional Limits for tasks assessed  Exercises   03/14/15 0900  Shoulder Instructions   Donning/doffing shirt without moving shoulder Maximal assistance  Method for sponge bathing under operated UE Minimal assistance  Donning/doffing sling/immobilizer Maximal assistance  Correct positioning of sling/immobilizer Minimal assistance  Pendulum exercises (written home exercise program) (deferred due to safety)  ROM for elbow, wrist and digits of operated UE Supervision/safety  Sling wearing schedule (on at all times/off for ADL's) Supervision/safety  Proper positioning of operated UE when showering Supervision/safety  Positioning of UE while sleeping Supervision/safety  Shoulder Exercises  Pendulum Exercise (deferred due to safety)  Shoulder Flexion PROM;Left;10 reps;Supine  Shoulder ABduction PROM;Left;10 reps;Supine  Shoulder External Rotation PROM;Left;10 reps;Supine  Elbow Flexion AAROM;Left;10 reps;Seated  Elbow Extension AAROM;Left;10 reps;Seated  Wrist Flexion AAROM;Left;10 reps;Seated  Wrist Extension AAROM;Left;10 reps;Seated  Digit Composite Flexion AAROM;Left;10 reps;Seated  Composite Extension AAROM;Left;10 reps;Seated       Home Living Family/patient expects to be discharged to:: Private residence Living Arrangements: Non-relatives/Friends Available Help at Discharge: Friend(s);Available PRN/intermittently (friend Herbie Baltimore) staying with pt overnight) Type of Home: House Home Access: Stairs to enter [employer] of Steps: 3 (from carport) Entrance Stairs-Rails: None Home Layout: One level     Bathroom Shower/Tub: Teacher, early years/pre: Standard     Home Equipment: Cane - single point   Additional Comments: Pt will have friend Herbie [place]) staying overnight with him and 2 other friends checking in PRN throughout the day.       Prior Functioning/Environment Level of Independence: Independent             OT Diagnosis: Generalized weakness;Acute pain   OT Problem List: Decreased strength;Decreased range of motion;Decreased  activity tolerance;Impaired balance (sitting and/or standing);Decreased safety awareness;Decreased knowledge of precautions;Impaired UE functional use;Pain   OT Treatment/Interventions: Self-care/ADL training;Therapeutic exercise;Energy conservation;DME and/or AE instruction;Therapeutic activities;Patient/family education;Balance training    OT Goals(Current goals can be found in the care plan section) Acute Rehab OT Goals Patient Stated Goal: to go home and be safe OT Goal Formulation: With patient Time For Goal Achievement: 03/09/15 Potential to Achieve Goals: Good ADL Goals Pt Will Perform Grooming: with supervision;standing Pt Will Perform Upper Body Bathing: with set-up;with supervision;sitting;with caregiver independent in assisting Pt Will Perform Upper Body Dressing: with set-up;with supervision;sitting;with caregiver independent in assisting Pt Will Transfer to Toilet: with supervision;ambulating Pt/caregiver will Perform Home Exercise Program: Increased ROM;Left upper extremity;With Supervision;With written HEP provided  OT Frequency: Min 2X/week    End of Session Equipment Utilized During Treatment: Other (comment) (sling)  Activity Tolerance: Patient tolerated treatment well Patient left: in chair;with call bell/phone within reach   Time: 0915-0955 OT Time Calculation (min): 40 min Charges:  OT General Charges $OT Visit: 1 Procedure OT Evaluation $Initial OT Evaluation Tier I: 1 Procedure OT Treatments $Self Care/Home Management : 8-22 mins $Therapeutic Exercise: 8-22 mins G-Codes:    Juluis Rainier 2015-03-14, 10:18 AM  Cyndie Chime, OTR/L Occupational Therapist [phone] (pager)

## 2015-02-23 NOTE — Progress Notes (Signed)
Subjective: Pt stable - pain ok   Objective: Vital signs in last 24 hours: Temp:  [97 F (36.1 C)-99.4 F (37.4 C)] 99.4 F (37.4 C) (05/18 0532) Pulse Rate:  [52-74] 69 (05/18 0532) Resp:  [4-21] 18 (05/18 0532) BP: (92-112)/(53-75) 95/58 mmHg (05/18 0532) SpO2:  [94 %-100 %] 94 % (05/18 0532)  Intake/Output from previous day: 05/17 0701 - 05/18 0700 In: 5781.3 [I.V.:5581.3; IV Piggyback:200] Out: 2075 [Urine:1725; Blood:350] Intake/Output this shift: Total I/O In: -  Out: 100 [Urine:100]  Exam:  Sensation intact distally Intact pulses distally Compartment soft  Labs:  Recent Labs  02/23/15 0517  HGB 11.9*    Recent Labs  02/23/15 0517  WBC 8.6  RBC 3.66*  HCT 35.6*  PLT 213    Recent Labs  02/23/15 0517  NA 134*  K 4.4  CL 104  CO2 20*  BUN 19  CREATININE 1.33*  GLUCOSE 223*  CALCIUM 8.5*    Recent Labs  02/22/15 0938  INR 1.12    Assessment/Plan: Plan OT today - dc am   DEAN,GREGORY SCOTT 02/23/2015, 7:11 AM

## 2015-02-23 NOTE — Progress Notes (Signed)
Pt has had periods of confusion. Pt is alert to person and place, but he thinks his surgery is scheduled for tomorrow. He has attempted to get out of bed 6 times. Pt has been reoriented and provided assurance that he has had his surgery and that he his still at [facility] on [address]. Pt is in bed in lowest position with bed alarm on, call bell within reach. On call MD called. Dr. Ninfa Linden approved of a UA specimen order. Will continue to monitor.

## 2015-02-24 LAB — GLUCOSE, CAPILLARY: GLUCOSE-CAPILLARY: 228 mg/dL — AB (ref 65–99)

## 2015-02-24 MED ORDER — DOCUSATE SODIUM 100 MG PO CAPS: 100.0000 mg | ORAL_CAPSULE | Freq: Two times a day (BID) | ORAL | Status: AC

## 2015-02-24 MED ORDER — HYDROCODONE-ACETAMINOPHEN 10-325 MG PO TABS: 1.0000 | ORAL_TABLET | ORAL | Status: DC | PRN

## 2015-02-24 MED ORDER — ASPIRIN 325 MG PO TBEC: 325.0000 mg | DELAYED_RELEASE_TABLET | Freq: Every day | ORAL | Status: DC

## 2015-02-24 NOTE — Progress Notes (Signed)
Subjective: Pt stable - walking in room very well   Objective: Vital signs in last 24 hours: Temp:  [98.2 F (36.8 C)-99.1 F (37.3 C)] 99.1 F (37.3 C) (05/19 2229) Pulse Rate:  [60-78] 68 (05/19 0632) Resp:  [18-20] 18 (05/19 7989) BP: (103-126)/(56-68) 111/65 mmHg (05/19 0632) SpO2:  [96 %-100 %] 97 % (05/19 2119)  Intake/Output from previous day: 05/18 0701 - 05/19 0700 In: 480 [P.O.:480] Out: 100 [Urine:100] Intake/Output this shift:    Exam:  Intact pulses distally Dorsiflexion/Plantar flexion intact No cellulitis present  Labs:  Recent Labs  02/23/15 0517  HGB 11.9*    Recent Labs  02/23/15 0517  WBC 8.6  RBC 3.66*  HCT 35.6*  PLT 213    Recent Labs  02/23/15 0517  NA 134*  K 4.4  CL 104  CO2 20*  BUN 19  CREATININE 1.33*  GLUCOSE 223*  CALCIUM 8.5*    Recent Labs  02/22/15 0938  INR 1.12    Assessment/Plan: Plan dc today   DEAN,GREGORY SCOTT 02/24/2015, 8:10 AM

## 2015-02-24 NOTE — Discharge Summary (Signed)
Physician Discharge Summary  Patient ID: [name] Bell MRN: [id number] DOB/AGE: May 13, 1934 81 y.o.  Admit date: 02/22/2015 Discharge date: 02/24/2015  Admission Diagnoses:  Active Problems:   Arthritis of shoulder region, left   Discharge Diagnoses:  Same  Surgeries: Procedure(s): LEFT TOTAL SHOULDER ARTHROPLASTY on 02/22/2015   Consultants:    Discharged Condition: Stable  Hospital Course: [name] Bell is an 81 y.o. male who was admitted 02/22/2015 with a chief complaint of left shoulder pain, and found to have a diagnosis of left shoulder arthritis.  They were brought to the operating room on 02/22/2015 and underwent the above named procedures.  He made good progress with OT and PT and was dc ed to home on POD 2 with CPM and plans for eventual outpatient PT at [facility]  Antibiotics given:  Anti-infectives    Start     Dose/Rate Route Frequency Ordered Stop   02/22/15 2030  vancomycin (VANCOCIN) IVPB 1000 mg/200 mL premix     1,000 mg 200 mL/hr over 60 Minutes Intravenous Every 12 hours 02/22/15 1940 02/22/15 2233   02/22/15 0700  ceFAZolin (ANCEF) IVPB 2 g/50 mL premix  Status:  Discontinued     2 g 100 mL/hr over 30 Minutes Intravenous To Surgery 02/21/15 1225 02/22/15 1929   02/22/15 0645  vancomycin (VANCOCIN) IVPB 1000 mg/200 mL premix     1,000 mg 200 mL/hr over 60 Minutes Intravenous  Once 02/22/15 0635 02/22/15 0758    .  Recent vital signs:  Filed Vitals:   02/24/15 0632  BP: 111/65  Pulse: 68  Temp: 99.1 F (37.3 C)  Resp: 18    Recent laboratory studies:  Results for orders placed or performed during the hospital encounter of 02/22/15  Glucose, capillary  Result Value Ref Range   Glucose-Capillary 163 (H) 65 - 99 mg/dL   Comment 1 Notify RN   Protime-INR  Result Value Ref Range   Prothrombin Time 14.6 11.6 - 15.2 seconds   INR 1.12 0.00 - 1.49  APTT  Result Value Ref Range   aPTT 35 24 - 37 seconds  Glucose, capillary  Result  Value Ref Range   Glucose-Capillary 140 (H) 65 - 99 mg/dL  CBC  Result Value Ref Range   WBC 8.6 4.0 - 10.5 K/uL   RBC 3.66 (L) 4.22 - 5.81 MIL/uL   Hemoglobin 11.9 (L) 13.0 - 17.0 g/dL   HCT 35.6 (L) 39.0 - 52.0 %   MCV 97.3 78.0 - 100.0 fL   MCH 32.5 26.0 - 34.0 pg   MCHC 33.4 30.0 - 36.0 g/dL   RDW 14.0 11.5 - 15.5 %   Platelets 213 150 - 400 K/uL  Basic metabolic panel  Result Value Ref Range   Sodium 134 (L) 135 - 145 mmol/L   Potassium 4.4 3.5 - 5.1 mmol/L   Chloride 104 101 - 111 mmol/L   CO2 20 (L) 22 - 32 mmol/L   Glucose, Bld 223 (H) 65 - 99 mg/dL   BUN 19 6 - 20 mg/dL   Creatinine, Ser 1.33 (H) 0.61 - 1.24 mg/dL   Calcium 8.5 (L) 8.9 - 10.3 mg/dL   GFR calc non Af Amer 50 (L) >60 mL/min   GFR calc Af Amer 57 (L) >60 mL/min   Anion gap 10 5 - 15  Glucose, capillary  Result Value Ref Range   Glucose-Capillary 156 (H) 65 - 99 mg/dL   Comment 1 Notify RN   Urinalysis, Routine w reflex  microscopic  Result Value Ref Range   Color, Urine YELLOW YELLOW   APPearance CLEAR CLEAR   Specific Gravity, Urine 1.038 (H) 1.005 - 1.030   pH 5.5 5.0 - 8.0   Glucose, UA >1000 (A) NEGATIVE mg/dL   Hgb urine dipstick MODERATE (A) NEGATIVE   Bilirubin Urine NEGATIVE NEGATIVE   Ketones, ur 15 (A) NEGATIVE mg/dL   Protein, ur NEGATIVE NEGATIVE mg/dL   Urobilinogen, UA 0.2 0.0 - 1.0 mg/dL   Nitrite NEGATIVE NEGATIVE   Leukocytes, UA NEGATIVE NEGATIVE  Urine microscopic-add on  Result Value Ref Range   Squamous Epithelial / LPF FEW (A) RARE   WBC, UA 0-2 <3 WBC/hpf   RBC / HPF 21-50 <3 RBC/hpf   Bacteria, UA FEW (A) RARE  Glucose, capillary  Result Value Ref Range   Glucose-Capillary 210 (H) 65 - 99 mg/dL   Comment 1 Notify RN   Glucose, capillary  Result Value Ref Range   Glucose-Capillary 194 (H) 65 - 99 mg/dL   Comment 1 Notify RN   Glucose, capillary  Result Value Ref Range   Glucose-Capillary 209 (H) 65 - 99 mg/dL   Comment 1 Notify RN   Glucose, capillary  Result  Value Ref Range   Glucose-Capillary 228 (H) 65 - 99 mg/dL    Discharge Medications:     Medication List    STOP taking these medications        acetaminophen 500 MG tablet  Commonly known as:  TYLENOL     HYDROcodone-acetaminophen 5-325 MG per tablet  Commonly known as:  NORCO/VICODIN  Replaced by:  HYDROcodone-acetaminophen 10-325 MG per tablet      TAKE these medications        aspirin 325 MG EC tablet  Take 1 tablet (325 mg total) by mouth daily.     canagliflozin 300 MG Tabs tablet  Commonly known as:  INVOKANA  Take 300 mg by mouth daily before breakfast.     Chromium 200 MCG Tabs  Take 1 tablet by mouth daily.     docusate sodium 100 MG capsule  Commonly known as:  COLACE  Take 1 capsule (100 mg total) by mouth 2 (two) times daily.     HYDROcodone-acetaminophen 10-325 MG per tablet  Commonly known as:  NORCO  Take 1-2 tablets by mouth every 4 (four) hours as needed (breakthrough pain).     Linagliptin-Metformin HCl 2.5-500 MG Tabs  Take 1 tablet by mouth 2 (two) times daily.     lisinopril 10 MG tablet  Commonly known as:  PRINIVIL,ZESTRIL  Take 10 mg by mouth daily.     metoCLOPramide 10 MG tablet  Commonly known as:  REGLAN  Take 10 mg by mouth every 6 (six) hours as needed (Hiccups).     multivitamin with minerals tablet  Take 1 tablet by mouth daily.     ONE TOUCH ULTRA TEST test strip  Generic drug:  glucose blood  USE AS DIRECTED TWICE DAILY     onetouch ultrasoft lancets  USE TO CHECK BLOOD SUGAR TWICE DAILY     pioglitazone 30 MG tablet  Commonly known as:  ACTOS  Take 30 mg by mouth daily.     pravastatin 20 MG tablet  Commonly known as:  PRAVACHOL  Take 20 mg by mouth daily.     propranolol 40 MG tablet  Commonly known as:  INDERAL  Take 40 mg by mouth daily.     simvastatin 80 MG tablet  Commonly known as:  ZOCOR  TAKE 1 TABLET BY MOUTH EVERY MONDAY,    WEDNESDAY, & FRIDAY     Testosterone 20.25 MG/1.25GM (1.62%) Gel   Commonly known as:  ANDROGEL  Place 2 Squirts onto the skin daily.     TURMERIC PO  Take 800 mg by mouth daily.     vitamin B-12 1000 MCG tablet  Commonly known as:  CYANOCOBALAMIN  Take 1,000 mcg by mouth daily.     vitamin C 1000 MG tablet  Take 1,000 mg by mouth daily. With Rose hips        Diagnostic Studies: Dg Chest 2 View  02/14/2015   CLINICAL DATA:  Left total shoulder arthroplasty. Preop examination.  EXAM: CHEST  2 VIEW  COMPARISON:  CT abdomen 11/24/2012, chest x-ray 12/14/2009  FINDINGS: There is mild lingular scarring. There is no other focal parenchymal opacity, pleural effusion, or pneumothorax. The heart and mediastinal contours are unremarkable.  There is severe left glenohumeral osteoarthritis.  IMPRESSION: No active cardiopulmonary disease.  Severe left glenohumeral osteoarthritis.   Electronically Signed   By: Kathreen Devoid   On: 02/14/2015 12:02   Dg Shoulder Left Port  02/22/2015   CLINICAL DATA:  Status post left shoulder replacement  EXAM: LEFT SHOULDER - 1 VIEW  COMPARISON:  None.  FINDINGS: Left shoulder prosthesis is now seen. No acute abnormality is noted.  IMPRESSION: Status post left shoulder replacement.   Electronically Signed   By: Inez Catalina M.D.   On: 02/22/2015 14:43    Disposition: 01-Home or Self Care      Discharge Instructions    Call MD / Call 911    Complete by:  As directed   If you experience chest pain or shortness of breath, CALL 911 and be transported to the hospital emergency room.  If you develope a fever above 101 F, pus (white drainage) or increased drainage or redness at the wound, or calf pain, call your surgeon's office.     Constipation Prevention    Complete by:  As directed   Drink plenty of fluids.  Prune juice may be helpful.  You may use a stool softener, such as Colace (over the counter) 100 mg twice a day.  Use MiraLax (over the counter) for constipation as needed.     Diet - low sodium heart healthy    Complete by:   As directed      Discharge instructions    Complete by:  As directed   CPM 1 hour 3 times a day Keep incision dry Ok to shower with dressing No lifting with left arm Ok for home exercise program of pendulum exercises as described by occupational therapist     Increase activity slowly as tolerated    Complete by:  As directed            Follow-up Information    Follow up with [facility].   Why:  Someone from [facility] will contact you with start time for therapy.   Contact information:   [place] [address] [place] [address] [phone]        Signed: Meredith Pel 02/24/2015, 11:41 PM

## 2015-02-24 NOTE — Progress Notes (Signed)
D/C instructions reviewed with pt and his caregiver/POA. Copy of instructions and scripts given to pt/caregiver. Pt d/c'd via wheelchair with belongings with caregiver/POA, escorted by hospital volunteer.

## 2015-02-24 NOTE — Progress Notes (Addendum)
Upon discharge patients POA Martin Majestic) would like a complete list of all medications the patient took while inpatient care. Nursing will continue to monitor.

## 2015-02-24 NOTE — Progress Notes (Signed)
Occupational Therapy Treatment Patient Details Name: [name] Bell MRN: [id number] DOB: 01/26/1934 Today's Date: 02/24/2015    History of present illness Pt is a 81 y.o. Male s/p L TSA on 02/22/15.    OT comments  Pt making great progress with exercises and adl.  Pt overall at the min assist to S level.  Pt's caregiver was educated re: all adls and how to assist with PROM exercises.    Follow Up Recommendations  Home health OT;Supervision/Assistance - 24 hour    Equipment Recommendations  None recommended by OT    Recommendations for Other Services      Precautions / Restrictions Precautions Precautions: Shoulder;Fall Type of Shoulder Precautions: Passive Protocol (PROM FF 0-90, ABD 0-60, ER 0-30); AROM elbow, wrist, hand; Pendulums Shoulder Interventions: Shoulder sling/immobilizer;At all times;Off for dressing/bathing/exercises Precaution Booklet Issued: Yes (comment) Precaution Comments: Educated pt on incorporating precautions into ADLs.  Required Braces or Orthoses: Sling Restrictions Weight Bearing Restrictions: Yes LUE Weight Bearing: Non weight bearing       Mobility Bed Mobility Overal bed mobility: Modified Independent             General bed mobility comments: extra time to get to EOB but no assist needed.  Transfers Overall transfer level: Needs assistance Equipment used: 1 person hand held assist Transfers: Sit to/from Stand Sit to Stand: Supervision         General transfer comment: much safer with all mobility today.    Balance Overall balance assessment: Needs assistance Sitting-balance support: Feet supported Sitting balance-Leahy Scale: Good     Standing balance support: During functional activity Standing balance-Leahy Scale: Good                     ADL Overall ADL's : Needs assistance/impaired Eating/Feeding: Set up;Sitting Eating/Feeding Details (indicate cue type and reason): assist to open containers and cut  food Grooming: Set up;Standing           Upper Body Dressing : Minimal assistance;Sitting Upper Body Dressing Details (indicate cue type and reason): including sling Lower Body Dressing: Minimal assistance;Sit to/from stand Lower Body Dressing Details (indicate cue type and reason): pt needed assist to pull up the back of his pants; otherwise only VCs. Toilet Transfer: Supervision/safety;Ambulation;Comfort height toilet   Toileting- Clothing Manipulation and Hygiene: Supervision/safety;Sit to/from stand       Functional mobility during ADLs: Supervision/safety General ADL Comments: Pt much steadier today.  Pt fully dressed self with VCs and only occasional assist.  VCs required for pt to stop actively moving the shoulder.      Vision                     Perception     Praxis      Cognition   Behavior During Therapy: [facility] for tasks assessed/performed Overall Cognitive Status: Within Functional Limits for tasks assessed                       Extremity/Trunk Assessment               Exercises Shoulder Exercises Shoulder Flexion: PROM;Left;10 reps;Supine Shoulder ABduction: PROM;Left;10 reps;Supine Shoulder External Rotation: PROM;Left;10 reps;Supine Elbow Flexion: AAROM;Left;10 reps;Seated Elbow Extension: AAROM;Left;10 reps;Seated Wrist Flexion: AAROM;Left;10 reps;Seated Wrist Extension: AAROM;Left;10 reps;Seated Digit Composite Flexion: AAROM;Left;10 reps;Seated Composite Extension: AAROM;Left;10 reps;Seated Donning/doffing shirt without moving shoulder: Minimal assistance Method for sponge bathing under operated UE: Supervision/safety Donning/doffing sling/immobilizer: Supervision/safety Correct positioning of sling/immobilizer: Supervision/safety ROM for  elbow, wrist and digits of operated UE: Supervision/safety Sling wearing schedule (on at all times/off for ADL's): Supervision/safety Proper positioning of operated UE when showering:  Supervision/safety Positioning of UE while sleeping: Supervision/safety   Shoulder Instructions Shoulder Instructions Donning/doffing shirt without moving shoulder: Minimal assistance Method for sponge bathing under operated UE: Supervision/safety Donning/doffing sling/immobilizer: Supervision/safety Correct positioning of sling/immobilizer: Supervision/safety ROM for elbow, wrist and digits of operated UE: Supervision/safety Sling wearing schedule (on at all times/off for ADL's): Supervision/safety Proper positioning of operated UE when showering: Supervision/safety Positioning of UE while sleeping: Supervision/safety     General Comments      Pertinent Vitals/ Pain       Pain Assessment: 0-10 Pain Score: 4  Pain Location: L shoulder w movement Pain Descriptors / Indicators: Aching Pain Intervention(s): Limited activity within patient's tolerance;Monitored during session;Repositioned  Home Living                                          Prior Functioning/Environment              Frequency Min 2X/week     Progress Toward Goals  OT Goals(current goals can now be found in the care plan section)  Progress towards OT goals: Progressing toward goals  Acute Rehab OT Goals Patient Stated Goal: to go home and be safe OT Goal Formulation: With patient Time For Goal Achievement: 03/09/15 Potential to Achieve Goals: Good ADL Goals Pt Will Perform Grooming: with supervision;standing Pt Will Perform Upper Body Bathing: with set-up;with supervision;sitting;with caregiver independent in assisting Pt Will Perform Upper Body Dressing: with set-up;with supervision;sitting;with caregiver independent in assisting Pt Will Transfer to Toilet: with supervision;ambulating Pt/caregiver will Perform Home Exercise Program: Increased ROM;Left upper extremity;With Supervision;With written HEP provided  Plan Discharge plan remains appropriate    Co-evaluation                  End of Session Equipment Utilized During Treatment: Other (comment) (sling)   Activity Tolerance Patient tolerated treatment well   Patient Left in bed;with call bell/phone within reach;with family/visitor present   Nurse Communication Mobility status        Time: 1010-1055 OT Time Calculation (min): 45 min  Charges: OT General Charges $OT Visit: 1 Procedure OT Treatments $Self Care/Home Management : 8-22 mins $Therapeutic Exercise: 23-37 mins  Glenford Peers 02/24/2015, 11:08 AM  [phone]

## 2015-02-25 LAB — GLUCOSE, CAPILLARY: GLUCOSE-CAPILLARY: 145 mg/dL — AB (ref 65–99)

## 2015-05-25 LAB — HM DIABETES EYE EXAM

## 2015-10-09 HISTORY — PX: PARTIAL GLOSSECTOMY: SHX2173

## 2015-11-10 ENCOUNTER — Other Ambulatory Visit: Payer: Self-pay | Admitting: Family Medicine

## 2016-05-02 LAB — HM DIABETES EYE EXAM

## 2016-05-03 ENCOUNTER — Encounter: Payer: Self-pay | Admitting: *Deleted

## 2016-05-26 ENCOUNTER — Encounter (INDEPENDENT_AMBULATORY_CARE_PROVIDER_SITE_OTHER): Payer: Self-pay

## 2016-09-24 ENCOUNTER — Encounter (INDEPENDENT_AMBULATORY_CARE_PROVIDER_SITE_OTHER): Payer: Self-pay

## 2016-09-24 ENCOUNTER — Ambulatory Visit (INDEPENDENT_AMBULATORY_CARE_PROVIDER_SITE_OTHER): Payer: Medicare Other | Admitting: Orthopedic Surgery

## 2016-09-24 ENCOUNTER — Encounter (INDEPENDENT_AMBULATORY_CARE_PROVIDER_SITE_OTHER): Payer: Self-pay | Admitting: Orthopedic Surgery

## 2016-09-24 DIAGNOSIS — M19011 Primary osteoarthritis, right shoulder: Secondary | ICD-10-CM | POA: Diagnosis not present

## 2016-09-24 MED ORDER — HYDROCODONE-ACETAMINOPHEN 5-325 MG PO TABS
1.0000 | ORAL_TABLET | Freq: Every day | ORAL | 0 refills | Status: DC | PRN
Start: 2016-09-24 — End: 2018-02-07

## 2016-09-24 NOTE — Progress Notes (Signed)
Office Visit Note   Patient: [name] Bell           Date of Birth: March 28, 1934           MRN: [id number] Visit Date: 09/24/2016 Requested by: No referring provider defined for this encounter. PCP: PROVIDER NOT IN SYSTEM  Subjective: Chief Complaint  Patient presents with  . Right Shoulder - Pain    HPILloyd is a 82 year old patient with known right shoulder arthritis.  He's been having some continued pain in the right shoulder.  He's doing well from his left shoulder replacement.  He takes an occasional hydrocodone about once a week to help him sleep along with ibuprofen.  He denies any neck pain or radicular symptoms.  He is right-hand-dominant.  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  He does have friends in town but no definite live in relatives.              Review of Systems All systems reviewed are negative as they relate to the chief complaint within the history of present illness.  Patient denies  fevers or chills.    Assessment & Plan: Visit Diagnoses:  1. Arthritis of right shoulder region     Plan: Impression is right shoulder pain from arthritis.  Currently he has maintained his external rotation to about 25 or 30.  I would say as long as he can manage and live with the symptoms we can postpone shoulder replacement until a time where his symptoms are worse and he is mentally in a better position to undergo the procedure.  He did have tongue cancer earlier this year and is somewhat wary of hospitalization.  To that and we will reevaluate his shoulder in may  Follow-Up Instructions: Return in about 5 months (around 02/22/2017).   Orders:  No orders of the defined types were placed in this encounter.  Meds ordered this encounter  Medications  . HYDROcodone-acetaminophen (NORCO) 5-325 MG tablet    Sig: Take 1-2 tablets by mouth daily as needed for moderate pain.    Dispense:  60 tablet    Refill:  0      Procedures: No procedures performed   Clinical Data: No additional findings.  Objective: Vital Signs: There were no vitals taken for this visit.  Physical Exam   Constitutional: Patient appears well-developed HEENT:  Head: Normocephalic Eyes:EOM are normal Neck: Normal range of motion Cardiovascular: Normal rate Pulmonary/chest: Effort normal Neurologic: Patient is alert Skin: Skin is warm Psychiatric: Patient has normal mood and affect    Ortho ExamExamination of the right shoulder demonstrates external rotation to about 25 past neutral/and is past 90 isolated glenohumeral abduction is to about 80-85.  Rotator cuff strength is excellent to infraspinatus supraspinatus and subscap muscle testing.  A little bit of coarseness and grinding is present with internal/external rotation of the shoulder.  There is no posterior instability.  Specialty Comments:  No specialty comments available.  Imaging: No results found.   PMFS History: Patient Active Problem List   Diagnosis Date Noted  . Arthritis of right shoulder region 09/24/2016  . Arthritis of shoulder region, left 02/22/2015  . Type II or unspecified type diabetes mellitus without mention of complication, not stated as uncontrolled 02/11/2012  . Hypogonadism male 02/11/2012  . Arthritis of knee, right 10/30/2011   Past Medical History:  Diagnosis Date  . Allergy    RHINITIS  . Arthritis    osteoarthritis  .  Asthmatic bronchitis    seasonal  . Cancer ([place])    skin cancer removed from back  . Diabetes mellitus    type 2 Niddm x 6 yrs  . FHx: cardiovascular disease   . History of kidney stones   . History of pneumonia    reports d/t bronchitis  . Hx of colonic polyps   . Hypertension   . Hypogonadism male   . PONV (postoperative nausea and vomiting)    reports has a difficult time coming out of  . Seborrheic keratosis     No family history on file.  Past Surgical History:  Procedure Laterality Date  .  CHOLECYSTECTOMY  2010   GALL BLADDER  . HERNIA REPAIR     during cholecystectomy  . KNEE ARTHROPLASTY  10/30/2011   Procedure: COMPUTER ASSISTED TOTAL KNEE ARTHROPLASTY;  Surgeon: Meredith Pel, MD;  Location: [place];  Service: Orthopedics;  Laterality: Right;  right total knee arthroplasty  . KNEE CARTILAGE SURGERY  2009   Right knee  . SHOULDER ARTHROSCOPY Right   . TOTAL SHOULDER ARTHROPLASTY Left 02/22/2015  . TOTAL SHOULDER ARTHROPLASTY Left 02/22/2015   Procedure: LEFT TOTAL SHOULDER ARTHROPLASTY;  Surgeon: Meredith Pel, MD;  Location: [facility];  Service: Orthopedics;  Laterality: Left;   Social History   Occupational History  . Not on file.   Social History Main Topics  . Smoking status: Never Smoker  . Smokeless tobacco: Never Used  . Alcohol use 1.8 oz/week    3 Glasses of wine per week     Comment: occasional  . Drug use: No  . Sexual activity: Not Currently

## 2016-12-21 ENCOUNTER — Ambulatory Visit (INDEPENDENT_AMBULATORY_CARE_PROVIDER_SITE_OTHER): Payer: Medicare Other | Admitting: Orthopedic Surgery

## 2016-12-24 ENCOUNTER — Encounter (INDEPENDENT_AMBULATORY_CARE_PROVIDER_SITE_OTHER): Payer: Self-pay | Admitting: Orthopedic Surgery

## 2016-12-24 ENCOUNTER — Ambulatory Visit (INDEPENDENT_AMBULATORY_CARE_PROVIDER_SITE_OTHER): Payer: Medicare Other | Admitting: Orthopedic Surgery

## 2016-12-24 DIAGNOSIS — G8929 Other chronic pain: Secondary | ICD-10-CM

## 2016-12-24 DIAGNOSIS — M65331 Trigger finger, right middle finger: Secondary | ICD-10-CM | POA: Diagnosis not present

## 2016-12-24 DIAGNOSIS — M1811 Unilateral primary osteoarthritis of first carpometacarpal joint, right hand: Secondary | ICD-10-CM

## 2016-12-24 DIAGNOSIS — M25511 Pain in right shoulder: Secondary | ICD-10-CM

## 2016-12-26 DIAGNOSIS — M1811 Unilateral primary osteoarthritis of first carpometacarpal joint, right hand: Secondary | ICD-10-CM

## 2016-12-26 DIAGNOSIS — M65331 Trigger finger, right middle finger: Secondary | ICD-10-CM | POA: Diagnosis not present

## 2016-12-26 MED ORDER — BUPIVACAINE HCL 0.25 % IJ SOLN
0.6600 mL | INTRAMUSCULAR | Status: AC | PRN
  Administered 2016-12-26: .66 mL via INTRA_ARTICULAR

## 2016-12-26 MED ORDER — BUPIVACAINE HCL 0.25 % IJ SOLN
0.3300 mL | INTRAMUSCULAR | Status: AC | PRN
  Administered 2016-12-26: .33 mL

## 2016-12-26 MED ORDER — METHYLPREDNISOLONE ACETATE 40 MG/ML IJ SUSP
13.3300 mg | INTRAMUSCULAR | Status: AC | PRN
  Administered 2016-12-26: 13.33 mg

## 2016-12-26 MED ORDER — LIDOCAINE HCL 1 % IJ SOLN
1.0000 mL | INTRAMUSCULAR | Status: AC | PRN
  Administered 2016-12-26: 1 mL

## 2016-12-26 MED ORDER — LIDOCAINE HCL 1 % IJ SOLN
3.0000 mL | INTRAMUSCULAR | Status: AC | PRN
Start: 2016-12-26 — End: 2016-12-26
  Administered 2016-12-26: 3 mL

## 2016-12-26 MED ORDER — BUPIVACAINE HCL 0.5 % IJ SOLN
1.0000 mL | INTRAMUSCULAR | Status: AC | PRN
  Administered 2016-12-26: 1 mL via INTRA_ARTICULAR

## 2016-12-26 NOTE — Progress Notes (Signed)
Office Visit Note   Patient: [name] Bell           Date of Birth: [birth year]           MRN: [id number] Visit Date: 12/24/2016 Requested by: No referring provider defined for this encounter. PCP: PROVIDER NOT IN SYSTEM  Subjective: Chief Complaint  Patient presents with  . Right Shoulder - Follow-up    HPI: [name] Bell is a 83 year old patient with left shoulder pain.  He is doing well from his left shoulder replacement 2 years ago.  He also reports right shoulder pain.  He wants to discuss right shoulder surgery.  His unable to lay on the right-hand side.  Patient also describes right wrist and hand pain.  States that his middle finger has been triggering for about 3 weeks.  He plays the PNA.  Takes a pain pill at night sometimes.  Also describes pain with pinching.  He is right-hand dominant.  Denies a history of injury to the right hand.              ROS: All systems reviewed are negative as they relate to the chief complaint within the history of present illness.  Patient denies  fevers or chills.   Assessment & Plan: Visit Diagnoses:  1. Chronic right shoulder pain   2. Trigger finger, right middle finger     Plan: Impression is right middle trigger finger which is pretty annoying and is locking on a daily basis.  Injected that trigger finger today under ultrasound guidance.  Give that about a 50% chance of helping this long-term.  He also has [place] arthritis in the right wrist.  That area is also injected under ultrasound guidance today.  In regards to his shoulder I would prefer not to inject 3 things today.  I think we could do an injection later if he wants or we could consider shoulder replacement.  He really just depends on what his preference is.  I'll see him back as needed  Follow-Up Instructions: No Follow-up on file.   Orders:  No orders of the defined types were placed in this encounter.  No orders of the defined types were placed in this encounter.     Procedures: Hand/UE Inj Date/Time: 12/26/2016 8:00 PM Performed by: Meredith Pel Authorized by: Meredith Pel   Consent Given by:  Patient Site marked: the procedure site was marked   Timeout: prior to procedure the correct patient, procedure, and site was verified   Indications:  Therapeutic Condition: trigger finger   Location:  Long finger Site:  R long A3 Prep: patient was prepped and draped in usual sterile fashion   Needle Size:  25 G Approach:  Volar Medications:  1 mL lidocaine 1 %; 0.33 mL bupivacaine 0.25 %; 13.33 mg methylPREDNISolone acetate 40 MG/ML Patient tolerance:  Patient tolerated the procedure well with no immediate complications Small Joint Inj Date/Time: 12/26/2016 8:00 PM Performed by: Meredith Pel Authorized by: Meredith Pel   Consent Given by:  Patient Site marked: the procedure site was marked   Timeout: prior to procedure the correct patient, procedure, and site was verified   Indications:  Pain Location:  Thumb Site:  R thumb CMC Prep: patient was prepped and draped in usual sterile fashion   Needle Size:  25 G Approach:  Radial Ultrasound Guided: No   Fluoroscopic Guidance: Yes   Medications:  3 mL lidocaine 1 %; 1 mL bupivacaine 0.5 %; 0.66 mL bupivacaine  0.25 % Aspiration Attempted: No   Patient tolerance:  Patient tolerated the procedure well with no immediate complications       Clinical Data: No additional findings.  Objective: Vital Signs: There were no vitals taken for this visit.  Physical Exam:   Constitutional: Patient appears well-developed HEENT:  Head: Normocephalic Eyes:EOM are normal Neck: Normal range of motion Cardiovascular: Normal rate Pulmonary/chest: Effort normal Neurologic: Patient is alert Skin: Skin is warm Psychiatric: Patient has normal mood and affect    Ortho Exam: Orthopedic exam demonstrates painful range of motion of the right shoulder but with reasonable rotator cuff  strength.  Does have some coarse grinding and crepitus present.  In regards to the right hand he does have tenderness at the A1 pulley base of the middle finger.  He also has positive grind test.  He has palpable radial pulse on the right-hand side with 5 out of 5 grip EPL FPL interosseous wrist flexion and wrist extension biceps triceps and deltoid strength.  Specialty Comments:  No specialty comments available.  Imaging: No results found.   PMFS History: Patient Active Problem List   Diagnosis Date Noted  . Arthritis of right shoulder region 09/24/2016  . Arthritis of shoulder region, left 02/22/2015  . Type II or unspecified type diabetes mellitus without mention of complication, not stated as uncontrolled 02/11/2012  . Hypogonadism male 02/11/2012  . Arthritis of knee, right 10/30/2011   Past Medical History:  Diagnosis Date  . Allergy    RHINITIS  . Arthritis    osteoarthritis  . Asthmatic bronchitis    seasonal  . Cancer ([place])    skin cancer removed from back  . Diabetes mellitus    type 2 Niddm x 6 yrs  . FHx: cardiovascular disease   . History of kidney stones   . History of pneumonia    reports d/t bronchitis  . Hx of colonic polyps   . Hypertension   . Hypogonadism male   . PONV (postoperative nausea and vomiting)    reports has a difficult time coming out of  . Seborrheic keratosis     No family history on file.  Past Surgical History:  Procedure Laterality Date  . CHOLECYSTECTOMY  2010   GALL BLADDER  . HERNIA REPAIR     during cholecystectomy  . KNEE ARTHROPLASTY  10/30/2011   Procedure: COMPUTER ASSISTED TOTAL KNEE ARTHROPLASTY;  Surgeon: Meredith Pel, MD;  Location: [facility];  Service: Orthopedics;  Laterality: Right;  right total knee arthroplasty  . KNEE CARTILAGE SURGERY  2009   Right knee  . SHOULDER ARTHROSCOPY Right   . TOTAL SHOULDER ARTHROPLASTY Left 02/22/2015  . TOTAL SHOULDER ARTHROPLASTY Left 02/22/2015   Procedure: LEFT TOTAL  SHOULDER ARTHROPLASTY;  Surgeon: Meredith Pel, MD;  Location: [facility];  Service: Orthopedics;  Laterality: Left;   Social History   Occupational History  . Not on file.   Social History Main Topics  . Smoking status: Never Smoker  . Smokeless tobacco: Never Used  . Alcohol use 1.8 oz/week    3 Glasses of wine per week     Comment: occasional  . Drug use: No  . Sexual activity: Not Currently

## 2017-10-08 DIAGNOSIS — J189 Pneumonia, unspecified organism: Secondary | ICD-10-CM

## 2017-10-08 HISTORY — DX: Pneumonia, unspecified organism: J18.9

## 2017-10-23 ENCOUNTER — Ambulatory Visit (INDEPENDENT_AMBULATORY_CARE_PROVIDER_SITE_OTHER): Payer: Medicare Other

## 2017-10-23 ENCOUNTER — Encounter (INDEPENDENT_AMBULATORY_CARE_PROVIDER_SITE_OTHER): Payer: Self-pay | Admitting: Orthopedic Surgery

## 2017-10-23 ENCOUNTER — Ambulatory Visit (INDEPENDENT_AMBULATORY_CARE_PROVIDER_SITE_OTHER): Payer: Medicare Other | Admitting: Orthopedic Surgery

## 2017-10-23 DIAGNOSIS — G8929 Other chronic pain: Secondary | ICD-10-CM

## 2017-10-23 DIAGNOSIS — M19011 Primary osteoarthritis, right shoulder: Secondary | ICD-10-CM | POA: Diagnosis not present

## 2017-10-23 DIAGNOSIS — M25511 Pain in right shoulder: Secondary | ICD-10-CM

## 2017-10-23 MED ORDER — METHYLPREDNISOLONE ACETATE 40 MG/ML IJ SUSP
40.0000 mg | INTRAMUSCULAR | Status: AC | PRN
  Administered 2017-10-23: 40 mg via INTRA_ARTICULAR

## 2017-10-23 MED ORDER — HYDROCODONE-ACETAMINOPHEN 5-325 MG PO TABS: ORAL_TABLET | ORAL | 0 refills | Status: DC

## 2017-10-23 MED ORDER — BUPIVACAINE HCL 0.5 % IJ SOLN
9.0000 mL | INTRAMUSCULAR | Status: AC | PRN
  Administered 2017-10-23: 9 mL via INTRA_ARTICULAR

## 2017-10-23 MED ORDER — LIDOCAINE HCL 1 % IJ SOLN
5.0000 mL | INTRAMUSCULAR | Status: AC | PRN
  Administered 2017-10-23: 5 mL

## 2017-10-23 NOTE — Progress Notes (Signed)
co

## 2017-10-23 NOTE — Addendum Note (Signed)
Addended byLaurann Montana on: 10/23/2017 05:13 PM   Modules accepted: Orders

## 2017-10-23 NOTE — Progress Notes (Signed)
Office Visit Note   Patient: [name] Bell           Date of Birth: July 27, 1934           MRN: [id number] Visit Date: 10/23/2017 Requested by: No referring provider defined for this encounter. PCP: System, Provider Not In  Subjective: Chief Complaint  Patient presents with  . 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  He has a known history of right shoulder arthritis.  The pain has been worse since summer 2018.  He describes grinding sensation and daily symptoms.  Most of his pain occurs when he is reaching across his body.  He also describes some relatively recent onset of weakness in the shoulder.  He denies any numbness or tingling or any neck pain.  He had left shoulder replacement 3 years ago and is done well with that.              ROS: All systems reviewed are negative as they relate to the chief complaint within the history of present illness.  Patient denies  fevers or chills.   Assessment & Plan: Visit Diagnoses:  1. Chronic right shoulder pain     Plan: Impression is right shoulder pain arthritis possible narrowing of the acromiohumeral distance.  Plan is injection into the right shoulder today glenohumeral joint.  Plan for reverse shoulder replacement in May.  Risk and benefits similar to the left shoulder.  I think he does have some rotator cuff pathology and thus to prevent more surgery reverse shoulder replacement has single operative option would be best for him.  Norco prescribed for nighttime use occasionally only.  He is also having some left knee pain and has done well with right knee replacement.  He uses knee brace on the left.  We can check that the out after he gets over his right shoulder surgery plan for CT scan for patient specific instrumentation for the reverse shoulder replacement on the right  Follow-Up Instructions: No Follow-up on file.   Orders:  Orders Placed This Encounter  Procedures    . XR Shoulder Right   Meds ordered this encounter  Medications  . HYDROcodone-acetaminophen (NORCO/VICODIN) 5-325 MG tablet    Sig: 1 po q d prn pain    Dispense:  30 tablet    Refill:  0      Procedures: Large Joint Inj: R glenohumeral on 10/23/2017 2:15 PM Indications: diagnostic evaluation and pain Details: 18 G 1.5 in needle, posterior approach  Arthrogram: No  Medications: 9 mL bupivacaine 0.5 %; 40 mg methylPREDNISolone acetate 40 MG/ML; 5 mL lidocaine 1 % Outcome: tolerated well, no immediate complications Procedure, treatment alternatives, risks and benefits explained, specific risks discussed. Consent was given by the patient. Immediately prior to procedure a time out was called to verify the correct patient, procedure, equipment, support staff and site/side marked as required. Patient was prepped and draped in the usual sterile fashion.       Clinical Data: No additional findings.  Objective: Vital Signs: There were no vitals taken for this visit.  Physical Exam:   Constitutional: Patient appears well-developed HEENT:  Head: Normocephalic Eyes:EOM are normal Neck: Normal range of motion Cardiovascular: Normal rate Pulmonary/chest: Effort normal Neurologic: Patient is alert Skin: Skin is warm Psychiatric: Patient has normal mood and affect    Ortho Exam: Orthopedic exam demonstrates good cervical spine range of motion.  On the right he  has forward flexion to about 90 and abduction to 75.  Slight weakness to supraspinatus testing on the right.  Subscap testing is intact.  He has coarse grinding and crepitus with passive range of motion of the shoulder.  External rotation at 15 degrees of abduction is about 20 degrees.  Passive forward flexion is to about 110 passive abduction is to about 80 on the right skin is intact on the right.  Specialty Comments:  No specialty comments available.  Imaging: Xr Shoulder Right  Result Date: 10/23/2017 AP lateral  outlet right shoulder reviewed.  Mild acromiohumeral distance narrowing is present.  Glenohumeral arthritis is noted on the axillary with minimal posterior glenoid erosion.  Visualized lung fields clear.    PMFS History: Patient Active Problem List   Diagnosis Date Noted  . Arthritis of right shoulder region 09/24/2016  . Arthritis of shoulder region, left 02/22/2015  . Type II or unspecified type diabetes mellitus without mention of complication, not stated as uncontrolled 02/11/2012  . Hypogonadism male 02/11/2012  . Arthritis of knee, right 10/30/2011   Past Medical History:  Diagnosis Date  . Allergy    RHINITIS  . Arthritis    osteoarthritis  . Asthmatic bronchitis    seasonal  . Cancer ([place])    skin cancer removed from back  . Diabetes mellitus    type 2 Niddm x 6 yrs  . FHx: cardiovascular disease   . History of kidney stones   . History of pneumonia    reports d/t bronchitis  . Hx of colonic polyps   . Hypertension   . Hypogonadism male   . PONV (postoperative nausea and vomiting)    reports has a difficult time coming out of  . Seborrheic keratosis     History reviewed. No pertinent family history.  Past Surgical History:  Procedure Laterality Date  . CHOLECYSTECTOMY  2010   GALL BLADDER  . HERNIA REPAIR     during cholecystectomy  . KNEE ARTHROPLASTY  10/30/2011   Procedure: COMPUTER ASSISTED TOTAL KNEE ARTHROPLASTY;  Surgeon: Meredith Pel, MD;  Location: [facility];  Service: Orthopedics;  Laterality: Right;  right total knee arthroplasty  . KNEE CARTILAGE SURGERY  2009   Right knee  . SHOULDER ARTHROSCOPY Right   . TOTAL SHOULDER ARTHROPLASTY Left 02/22/2015  . TOTAL SHOULDER ARTHROPLASTY Left 02/22/2015   Procedure: LEFT TOTAL SHOULDER ARTHROPLASTY;  Surgeon: Meredith Pel, MD;  Location: [facility];  Service: Orthopedics;  Laterality: Left;   Social History   Occupational History  . Not on file  Tobacco Use  . Smoking status: Never Smoker  .  Smokeless tobacco: Never Used  Substance and Sexual Activity  . Alcohol use: Yes    Alcohol/week: 1.8 oz    Types: 3 Glasses of wine per week    Comment: occasional  . Drug use: No  . Sexual activity: Not Currently

## 2017-11-01 ENCOUNTER — Ambulatory Visit
Admission: RE | Admit: 2017-11-01 | Discharge: 2017-11-01 | Disposition: A | Discharge status: Home / Self Care | Payer: Medicare Other | Source: Ambulatory Visit | Attending: Orthopedic Surgery | Admitting: Orthopedic Surgery

## 2017-11-01 DIAGNOSIS — M19011 Primary osteoarthritis, right shoulder: Secondary | ICD-10-CM

## 2017-11-06 ENCOUNTER — Ambulatory Visit (INDEPENDENT_AMBULATORY_CARE_PROVIDER_SITE_OTHER): Payer: Medicare Other | Admitting: Orthopedic Surgery

## 2017-11-06 ENCOUNTER — Encounter (INDEPENDENT_AMBULATORY_CARE_PROVIDER_SITE_OTHER): Payer: Self-pay | Admitting: Orthopedic Surgery

## 2017-11-06 DIAGNOSIS — M19011 Primary osteoarthritis, right shoulder: Secondary | ICD-10-CM

## 2017-11-08 ENCOUNTER — Encounter (INDEPENDENT_AMBULATORY_CARE_PROVIDER_SITE_OTHER): Payer: Self-pay | Admitting: Orthopedic Surgery

## 2017-11-08 NOTE — Progress Notes (Signed)
Office Visit Note   Patient: [name] Bell           Date of Birth: [birth year]           MRN: [id number] Visit Date: 11/06/2017 Requested by: No referring provider defined for this encounter. PCP: System, Provider Not In  Subjective: Chief Complaint  Patient presents with  . Right Shoulder - Follow-up    HPI: Rowyn is a patient with right shoulder pain and arthritis.  Since I have seen him he has had a CT scan.  We will use the CT scan for preoperative templating.  The scan does show end-stage arthritis with fairly minimal glenoid deformity.  Rotator cuff outlet is narrowed.              ROS: All systems reviewed are negative as they relate to the chief complaint within the history of present illness.  Patient denies  fevers or chills.   Assessment & Plan: Visit Diagnoses:  1. Arthritis of right shoulder region     Plan: Impression is right shoulder arthritis with some narrowing of the acromiohumeral distance.  Plan for this patient is reverse shoulder replacement.  Risk and benefits are discussed including but not limited to infection nerve vessel damage incomplete healing component loosening as well as potential need for revision surgery and dislocation.  All questions answered  Follow-Up Instructions: No Follow-up on file.   Orders:  No orders of the defined types were placed in this encounter.  No orders of the defined types were placed in this encounter.     Procedures: No procedures performed   Clinical Data: No additional findings.  Objective: Vital Signs: There were no vitals taken for this visit.  Physical Exam:   Constitutional: Patient appears well-developed HEENT:  Head: Normocephalic Eyes:EOM are normal Neck: Normal range of motion Cardiovascular: Normal rate Pulmonary/chest: Effort normal Neurologic: Patient is alert Skin: Skin is warm Psychiatric: Patient has normal mood and affect    Ortho Exam: Orthopedic exam demonstrates good  cervical spine range of motion.  On the right-hand side he has forward flexion and abduction to about 90 degrees.  External rotation at 15 degrees of abduction is to about 30.  There is some coarseness.  Deltoid is functional.  Rotator cuff strength is good to infraspinatus and supraspinatus testing and subscap testing.  Slightly weaker on supraspinatus testing.  Specialty Comments:  No specialty comments available.  Imaging: No results found.   PMFS History: Patient Active Problem List   Diagnosis Date Noted  . Arthritis of right shoulder region 09/24/2016  . Arthritis of shoulder region, left 02/22/2015  . Type II or unspecified type diabetes mellitus without mention of complication, not stated as uncontrolled 02/11/2012  . Hypogonadism male 02/11/2012  . Arthritis of knee, right 10/30/2011   Past Medical History:  Diagnosis Date  . Allergy    RHINITIS  . Arthritis    osteoarthritis  . Asthmatic bronchitis    seasonal  . Cancer ([place])    skin cancer removed from back  . Diabetes mellitus    type 2 Niddm x 6 yrs  . FHx: cardiovascular disease   . History of kidney stones   . History of pneumonia    reports d/t bronchitis  . Hx of colonic polyps   . Hypertension   . Hypogonadism male   . PONV (postoperative nausea and vomiting)    reports has a difficult time coming out of  . Seborrheic keratosis  History reviewed. No pertinent family history.  Past Surgical History:  Procedure Laterality Date  . CHOLECYSTECTOMY  2010   GALL BLADDER  . HERNIA REPAIR     during cholecystectomy  . KNEE ARTHROPLASTY  10/30/2011   Procedure: COMPUTER ASSISTED TOTAL KNEE ARTHROPLASTY;  Surgeon: Meredith Pel, MD;  Location: [facility];  Service: Orthopedics;  Laterality: Right;  right total knee arthroplasty  . KNEE CARTILAGE SURGERY  2009   Right knee  . SHOULDER ARTHROSCOPY Right   . TOTAL SHOULDER ARTHROPLASTY Left 02/22/2015  . TOTAL SHOULDER ARTHROPLASTY Left 02/22/2015    Procedure: LEFT TOTAL SHOULDER ARTHROPLASTY;  Surgeon: Meredith Pel, MD;  Location: [facility];  Service: Orthopedics;  Laterality: Left;   Social History   Occupational History  . Not on file  Tobacco Use  . Smoking status: Never Smoker  . Smokeless tobacco: Never Used  Substance and Sexual Activity  . Alcohol use: Yes    Alcohol/week: 1.8 oz    Types: 3 Glasses of wine per week    Comment: occasional  . Drug use: No  . Sexual activity: Not Currently

## 2018-02-06 ENCOUNTER — Other Ambulatory Visit (INDEPENDENT_AMBULATORY_CARE_PROVIDER_SITE_OTHER): Payer: Self-pay

## 2018-02-06 DIAGNOSIS — M19019 Primary osteoarthritis, unspecified shoulder: Secondary | ICD-10-CM

## 2018-02-07 ENCOUNTER — Other Ambulatory Visit (INDEPENDENT_AMBULATORY_CARE_PROVIDER_SITE_OTHER): Payer: Self-pay | Admitting: Orthopedic Surgery

## 2018-02-07 DIAGNOSIS — M19011 Primary osteoarthritis, right shoulder: Secondary | ICD-10-CM

## 2018-02-10 NOTE — Pre-Procedure Instructions (Addendum)
[name] Bell  02/10/2018      [facility] - [place], [place] - [address] [address] Phone: [phone] Fax: [phone]    Your procedure is scheduled on Tues. May 14  Report to [facility] Admitting at 8:45 A.M.  Call this number if you have problems the morning of surgery:  [phone]   Remember:  Do not eat food or drink liquids after midnight on Mon. May 13   Take these medicines the morning of surgery with A SIP OF WATER : none, eye drops if needed               7 days prior to surgery STOP taking  Aleve, Naproxen, Ibuprofen, Motrin, Advil, Goody's, BC's, all herbal medications, fish oil, and all vitamins               Follow your doctors instructions regarding your Aspirin.  If no instructions were given by your doctor, then you will need to call the prescribing office office to get instructions.                        How to Manage Your Diabetes Before and After Surgery  Why is it important to control my blood sugar before and after surgery? . Improving blood sugar levels before and after surgery helps healing and can limit problems. . A way of improving blood sugar control is eating a healthy diet by: o  Eating less sugar and carbohydrates o  Increasing activity/exercise o  Talking with your doctor about reaching your blood sugar goals . High blood sugars (greater than 180 mg/dL) can raise your risk of infections and slow your recovery, so you will need to focus on controlling your diabetes during the weeks before surgery. . Make sure that the doctor who takes care of your diabetes knows about your planned surgery including the date and location.  How do I manage my blood sugar before surgery? . Check your blood sugar at least 4 times a day, starting 2 days before surgery, to make sure that the level is not too high or low. o Check your blood sugar the morning of your surgery when you wake up and  every 2 hours until you get to the Short Stay unit. . If your blood sugar is less than 70 mg/dL, you will need to treat for low blood sugar: o Do not take insulin. o Treat a low blood sugar (less than 70 mg/dL) with  cup of clear juice (cranberry or apple), 4 glucose tablets, OR glucose gel. Recheck blood sugar in 15 minutes after treatment (to make sure it is greater than 70 mg/dL). If your blood sugar is not greater than 70 mg/dL on recheck, call [phone] o  for further instructions. . Report your blood sugar to the short stay nurse when you get to Short Stay.  . If you are admitted to the hospital after surgery: o Your blood sugar will be checked by the staff and you will probably be given insulin after surgery (instead of oral diabetes medicines) to make sure you have good blood sugar levels. o The goal for blood sugar control after surgery is 80-180 mg/dL.          WHAT DO I DO ABOUT MY DIABETES MEDICATION?   Marland Kitchen Do not take oral diabetes medicines (pills) the morning of surgery.      . The  day of surgery, do not take other diabetes injectables, including Byetta (exenatide), Bydureon (exenatide ER), Victoza (liraglutide), or Trulicity (dulaglutide).         Do not wear jewelry.  Do not wear lotions, powders, or perfumes, or deodorant.  Do not shave 48 hours prior to surgery.  Men may shave face and neck.  Do not bring valuables to the hospital.  [facility] is not responsible for any belongings or valuables.  Contacts, dentures or bridgework may not be worn into surgery.  Leave your suitcase in the car.  After surgery it may be brought to your room.  For patients admitted to the hospital, discharge time will be determined by your treatment team.  Patients discharged the day of surgery will not be allowed to drive home.    Special instructions:   [facility]- Preparing For Surgery  Before surgery, you can play an important role. Because skin is not sterile,  your skin needs to be as free of germs as possible. You can reduce the number of germs on your skin by washing with CHG (chlorahexidine gluconate) Soap before surgery.  CHG is an antiseptic cleaner which kills germs and bonds with the skin to continue killing germs even after washing.  Please do not use if you have an allergy to CHG or antibacterial soaps. If your skin becomes reddened/irritated stop using the CHG.  Do not shave (including legs and underarms) for at least 48 hours prior to first CHG shower. It is OK to shave your face.  Please follow these instructions carefully.   1. Shower the NIGHT BEFORE SURGERY and the MORNING OF SURGERY with CHG.   2. If you chose to wash your hair, wash your hair first as usual with your normal shampoo.  3. After you shampoo, rinse your hair and body thoroughly to remove the shampoo.  4. Use CHG as you would any other liquid soap. You can apply CHG directly to the skin and wash gently with a scrungie or a clean washcloth.   5. Apply the CHG Soap to your body ONLY FROM THE NECK DOWN.  Do not use on open wounds or open sores. Avoid contact with your eyes, ears, mouth and genitals (private parts). Wash Face and genitals (private parts)  with your normal soap.  6. Wash thoroughly, paying special attention to the area where your surgery will be performed.  7. Thoroughly rinse your body with warm water from the neck down.  8. DO NOT shower/wash with your normal soap after using and rinsing off the CHG Soap.  9. Pat yourself dry with a CLEAN TOWEL.  10. Wear CLEAN PAJAMAS to bed the night before surgery, wear comfortable clothes the morning of surgery  11. Place CLEAN SHEETS on your bed the night of your first shower and DO NOT SLEEP WITH PETS.    Day of Surgery: Do not apply any deodorants/lotions. Please wear clean clothes to the hospital/surgery center.      Please read over the following fact sheets that you were given. Coughing and Deep  Breathing, MRSA Information and Surgical Site Infection Prevention

## 2018-02-11 ENCOUNTER — Ambulatory Visit
Admission: RE | Admit: 2018-02-11 | Discharge: 2018-02-11 | Disposition: A | Discharge status: Home / Self Care | Payer: Medicare Other | Source: Ambulatory Visit | Attending: Orthopedic Surgery | Admitting: Orthopedic Surgery

## 2018-02-11 ENCOUNTER — Encounter (HOSPITAL_COMMUNITY): Payer: Self-pay

## 2018-02-11 ENCOUNTER — Encounter (HOSPITAL_COMMUNITY)
Admission: RE | Admit: 2018-02-11 | Discharge: 2018-02-11 | Disposition: A | Discharge status: Home / Self Care | Payer: Medicare Other | Source: Ambulatory Visit | Attending: Orthopedic Surgery | Admitting: Orthopedic Surgery

## 2018-02-11 ENCOUNTER — Encounter (HOSPITAL_COMMUNITY): Payer: Self-pay | Admitting: Vascular Surgery

## 2018-02-11 ENCOUNTER — Other Ambulatory Visit: Payer: Self-pay

## 2018-02-11 DIAGNOSIS — Z01812 Encounter for preprocedural laboratory examination: Secondary | ICD-10-CM | POA: Diagnosis present

## 2018-02-11 DIAGNOSIS — M19019 Primary osteoarthritis, unspecified shoulder: Secondary | ICD-10-CM

## 2018-02-11 DIAGNOSIS — E1165 Type 2 diabetes mellitus with hyperglycemia: Secondary | ICD-10-CM | POA: Diagnosis not present

## 2018-02-11 HISTORY — DX: Malignant neoplasm of mouth, unspecified: C06.9

## 2018-02-11 HISTORY — DX: Major depressive disorder, single episode, unspecified: F32.9

## 2018-02-11 HISTORY — DX: Depression, unspecified: F32.A

## 2018-02-11 HISTORY — DX: Anxiety disorder, unspecified: F41.9

## 2018-02-11 LAB — URINALYSIS, ROUTINE W REFLEX MICROSCOPIC
BILIRUBIN URINE: NEGATIVE
Bacteria, UA: NONE SEEN
HGB URINE DIPSTICK: NEGATIVE
KETONES UR: 5 mg/dL — AB
Leukocytes, UA: NEGATIVE
Nitrite: NEGATIVE
PH: 5 (ref 5.0–8.0)
Protein, ur: NEGATIVE mg/dL
SPECIFIC GRAVITY, URINE: 1.025 (ref 1.005–1.030)

## 2018-02-11 LAB — BASIC METABOLIC PANEL
ANION GAP: 9 (ref 5–15)
BUN: 15 mg/dL (ref 6–20)
CALCIUM: 9.6 mg/dL (ref 8.9–10.3)
CO2: 25 mmol/L (ref 22–32)
Chloride: 99 mmol/L — ABNORMAL LOW (ref 101–111)
Creatinine, Ser: 1.04 mg/dL (ref 0.61–1.24)
GFR calc non Af Amer: >60 mL/min (ref >60)
GLUCOSE: 333 mg/dL — AB (ref 65–99)
POTASSIUM: 5.1 mmol/L (ref 3.5–5.1)
Sodium: 133 mmol/L — ABNORMAL LOW (ref 135–145)

## 2018-02-11 LAB — CBC
HEMATOCRIT: 44.9 % (ref 39.0–52.0)
HEMOGLOBIN: 15.6 g/dL (ref 13.0–17.0)
MCH: 32.8 pg (ref 26.0–34.0)
MCHC: 34.7 g/dL (ref 30.0–36.0)
MCV: 94.3 fL (ref 78.0–100.0)
Platelets: 236 10*3/uL (ref 150–400)
RBC: 4.76 MIL/uL (ref 4.22–5.81)
RDW: 13.2 % (ref 11.5–15.5)
WBC: 8.7 10*3/uL (ref 4.0–10.5)

## 2018-02-11 LAB — TYPE AND SCREEN
ABO/RH(D): O POS
ANTIBODY SCREEN: NEGATIVE

## 2018-02-11 LAB — SURGICAL PCR SCREEN
MRSA, PCR: NEGATIVE
STAPHYLOCOCCUS AUREUS: NEGATIVE

## 2018-02-11 LAB — GLUCOSE, CAPILLARY: GLUCOSE-CAPILLARY: 325 mg/dL — AB (ref 65–99)

## 2018-02-11 NOTE — Progress Notes (Addendum)
PCP: Pati Gallo @ [facility] in [facility]  No cardiologist: stated had stress 10 yrs. Ago -normal, doesn't remember where  Fasting sugars 250 Reports had Hgb A1C done 5 weeks ago--will request  Also states he and PA have been working on getting his blood sugars down, increasing his Basaglar insulin.  Notified Trinna Post, PA of blood sugar 325, when pt. arived for pre-admission visit.  Pt. To contact Dr. Marlou Sa when to stop aspirin.

## 2018-02-11 NOTE — Progress Notes (Signed)
Need a1c thx

## 2018-02-11 NOTE — Progress Notes (Signed)
Need a 1 c thx

## 2018-02-12 ENCOUNTER — Encounter (HOSPITAL_COMMUNITY): Payer: Self-pay

## 2018-02-12 LAB — URINE CULTURE: Culture: NO GROWTH

## 2018-02-12 NOTE — Progress Notes (Signed)
Anesthesia Chart Review:   Case:  [id number] Date/Time:  02/18/18 1033   Procedure:  RIGHT REVERSE SHOULDER ARTHROPLASTY (Right )   Anesthesia type:  General   Pre-op diagnosis:  RIGHT SHOULDER OSTEOARTHRITIS   Location:  [facility] OR ROOM 06 / [place] [facility]   Surgeon:  Meredith Pel, MD      DISCUSSION:  - Pt is an 84 year old male with uncontrolled DM (A1c 11.8 on 02/10/18) -  I left voicemail for [facility] in Dr. Randel Pigg office about uncontrolled DM.    VS: BP 122/82   Pulse 72   Temp 36.4 C   Resp 20   Ht 5\' 5"  (1.651 m)   Wt 176 lb 8 oz (80.1 kg)   SpO2 97%   BMI 29.37 kg/m    PROVIDERS: - PCP is Bulla, Donald, PA-C   LABS:  - glucose 325 - HbA1c was 11.8 on 02/10/18 at PCP's office.   (all labs ordered are listed, but only abnormal results are displayed)  Labs Reviewed  GLUCOSE, CAPILLARY - Abnormal; Notable for the following components:      Result Value   Glucose-Capillary 325 (*)    All other components within normal limits  BASIC METABOLIC PANEL - Abnormal; Notable for the following components:   Sodium 133 (*)    Chloride 99 (*)    Glucose, Bld 333 (*)    All other components within normal limits  URINALYSIS, ROUTINE W REFLEX MICROSCOPIC - Abnormal; Notable for the following components:   Color, Urine AMBER (*)    APPearance HAZY (*)    Glucose, UA >=500 (*)    Ketones, ur 5 (*)    All other components within normal limits  URINE CULTURE  SURGICAL PCR SCREEN  CBC  TYPE AND SCREEN    EKG: Will be obtained day of surgery    Past Medical History:  Diagnosis Date  . Allergy    RHINITIS  . Anxiety   . Arthritis    osteoarthritis  . Asthmatic bronchitis    seasonal  . Cancer ([place])    skin cancer removed from back  . Depression   . Diabetes mellitus    type 2 Niddm x 6 yrs  . FHx: cardiovascular disease   . History of kidney stones   . History of pneumonia    reports d/t bronchitis  . Hx of colonic polyps   . Hypogonadism male   . PONV (postoperative  nausea and vomiting)    reports has a difficult time coming out of ( knee surgery)  . Seborrheic keratosis     Past Surgical History:  Procedure Laterality Date  . CHOLECYSTECTOMY  2010   GALL BLADDER  . HERNIA REPAIR     during cholecystectomy  . KNEE ARTHROPLASTY  10/30/2011   Procedure: COMPUTER ASSISTED TOTAL KNEE ARTHROPLASTY;  Surgeon: Meredith Pel, MD;  Location: [facility];  Service: Orthopedics;  Laterality: Right;  right total knee arthroplasty  . KNEE CARTILAGE SURGERY  2009   Right knee  . SHOULDER ARTHROSCOPY Right   . TONGUE SURGERY  2017   20% of tongue removed  and underneath on left side  . TOTAL SHOULDER ARTHROPLASTY Left 02/22/2015  . TOTAL SHOULDER ARTHROPLASTY Left 02/22/2015   Procedure: LEFT TOTAL SHOULDER ARTHROPLASTY;  Surgeon: Meredith Pel, MD;  Location: [place];  Service: Orthopedics;  Laterality: Left;    MEDICATIONS: . aspirin EC 325 MG EC tablet  . aspirin EC 81 MG tablet  .  cholecalciferol (VITAMIN D) 1000 units tablet  . Chromium (GTF CHROMIUM) 200 MCG TABS  . Cinnamon 500 MG TABS  . docusate sodium (COLACE) 100 MG capsule  . HYDROcodone-acetaminophen (NORCO/VICODIN) 5-325 MG tablet  . Insulin Glargine (BASAGLAR KWIKPEN) 100 UNIT/ML SOPN  . Lancets (ONETOUCH ULTRASOFT) lancets  . Liraglutide (VICTOZA) 18 MG/3ML SOPN  . Magnesium 250 MG TABS  . MAGNESIUM-POTASSIUM PO  . metFORMIN (GLUCOPHAGE-XR) 500 MG 24 hr tablet  . Multiple Vitamins-Minerals (MULTIVITAMIN WITH MINERALS) tablet  . Naphazoline HCl (CLEAR EYES OP)  . ONE TOUCH ULTRA TEST test strip  . pravastatin (PRAVACHOL) 20 MG tablet  . TURMERIC PO  . vitamin B-12 (CYANOCOBALAMIN) 500 MCG tablet   No current facility-administered medications for this encounter.     If blood glucose and EKG acceptable day of surgery, I anticipate pt can proceed with surgery as scheduled.  Willeen Cass, FNP-BC [facility] Stay [facility]/Anesthesiology Phone: [phone] 02/12/2018 3:34  PM

## 2018-02-18 ENCOUNTER — Encounter (HOSPITAL_COMMUNITY): Admission: RE | Payer: Self-pay | Source: Ambulatory Visit

## 2018-02-18 ENCOUNTER — Inpatient Hospital Stay (HOSPITAL_COMMUNITY): Admission: RE | Admit: 2018-02-18 | Payer: Medicare Other | Source: Ambulatory Visit | Admitting: Orthopedic Surgery

## 2018-02-18 SURGERY — ARTHROPLASTY, SHOULDER, TOTAL, REVERSE
Anesthesia: General | Laterality: Right

## 2018-02-28 ENCOUNTER — Inpatient Hospital Stay (INDEPENDENT_AMBULATORY_CARE_PROVIDER_SITE_OTHER): Payer: Medicare Other | Admitting: Orthopedic Surgery

## 2018-05-07 LAB — HM DIABETES EYE EXAM

## 2018-06-02 ENCOUNTER — Ambulatory Visit (INDEPENDENT_AMBULATORY_CARE_PROVIDER_SITE_OTHER): Payer: Medicare Other | Admitting: Orthopedic Surgery

## 2018-06-02 ENCOUNTER — Encounter (INDEPENDENT_AMBULATORY_CARE_PROVIDER_SITE_OTHER): Payer: Self-pay | Admitting: Orthopedic Surgery

## 2018-06-02 ENCOUNTER — Ambulatory Visit (INDEPENDENT_AMBULATORY_CARE_PROVIDER_SITE_OTHER): Payer: Self-pay

## 2018-06-02 DIAGNOSIS — M25562 Pain in left knee: Secondary | ICD-10-CM

## 2018-06-02 DIAGNOSIS — G8929 Other chronic pain: Secondary | ICD-10-CM | POA: Diagnosis not present

## 2018-06-03 ENCOUNTER — Encounter (INDEPENDENT_AMBULATORY_CARE_PROVIDER_SITE_OTHER): Payer: Self-pay | Admitting: Orthopedic Surgery

## 2018-06-03 NOTE — Progress Notes (Signed)
Office Visit Note   Patient: [name] Bell           Date of Birth: 1934-07-21           MRN: [id number] Visit Date: 06/02/2018 Requested by: Egbert Garibaldi, PA-C [address] [place], [place] [address] PCP: Egbert Garibaldi, PA-C  Subjective: Chief Complaint  Patient presents with  . Left Knee - Pain    HPI: Markeis is a patient with right shoulder and left knee pain.  He was scheduled for shoulder replacement but he had increased hemoglobin A1c.  Is currently decreased to 9.7 from a high of 11.8.  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  He also has left knee pain which is severe.  We discussed injection today but would hold off on that in order to diminish the chances that his blood glucose would increase.              ROS: All systems reviewed are negative as they relate to the chief complaint within the history of present illness.  Patient denies  fevers or chills.   Assessment & Plan: Visit Diagnoses:  1. Chronic pain of left knee     Plan: Impression is shoulder arthritis with improving hemoglobin A1c.  Plan is to perform shoulder replacement sometime in December.  Probably between the end of school and the beginning of the next semester.  We may need to consider knee replacement at some time in the future as well.  He is going to let me know what his next hemoglobin A1c is by phone.  We will likely try to schedule this shoulder replacement surgery for some time shortly after December 13.  Follow-Up Instructions: No follow-ups on file.   Orders:  Orders Placed This Encounter  Procedures  . XR KNEE 3 VIEW LEFT   No orders of the defined types were placed in this encounter.     Procedures: No procedures performed   Clinical Data: No additional findings.  Objective: Vital Signs: There were no vitals taken for this visit.  Physical Exam:   Constitutional: Patient appears well-developed HEENT:  Head:  Normocephalic Eyes:EOM are normal Neck: Normal range of motion Cardiovascular: Normal rate Pulmonary/chest: Effort normal Neurologic: Patient is alert Skin: Skin is warm Psychiatric: Patient has normal mood and affect    Ortho Exam: Ortho exam demonstrates painful right shoulder range of motion with reasonable rotator cuff strength and intact motor sensory function to the hand.  He does have limited forward flexion and abduction to around 90 degrees.  On that left knee he has no effusion but medial sided tenderness and pain.  About a 5 degree flexion contracture with intact extensor mechanism.  No other masses lymph adenopathy or skin changes noted in that left knee region.  Specialty Comments:  No specialty comments available.  Imaging: No results found.   PMFS History: Patient Active Problem List   Diagnosis Date Noted  . Arthritis of right shoulder region 09/24/2016  . Arthritis of shoulder region, left 02/22/2015  . Type II or unspecified type diabetes mellitus without mention of complication, not stated as uncontrolled 02/11/2012  . Hypogonadism male 02/11/2012  . Arthritis of knee, right 10/30/2011   Past Medical History:  Diagnosis Date  . Allergy    RHINITIS  . Anxiety   . Arthritis    osteoarthritis  . Asthmatic bronchitis    seasonal  . Cancer ([place])  skin cancer removed from back  . Depression   . Diabetes mellitus    type 2 Niddm x 6 yrs  . FHx: cardiovascular disease   . History of kidney stones   . History of pneumonia    reports d/t bronchitis  . Hx of colonic polyps   . Hypogonadism male   . 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 PONV (postoperative nausea and vomiting)    reports has a difficult time coming out of ( knee surgery)  . Seborrheic keratosis     History reviewed. No pertinent family history.  Past Surgical History:  Procedure Laterality Date  . CHOLECYSTECTOMY  2010   GALL BLADDER  . HERNIA REPAIR     during cholecystectomy  . KNEE  ARTHROPLASTY  10/30/2011   Procedure: COMPUTER ASSISTED TOTAL KNEE ARTHROPLASTY;  Surgeon: Meredith Pel, MD;  Location: [place];  Service: Orthopedics;  Laterality: Right;  right total knee arthroplasty  . KNEE CARTILAGE SURGERY  2009   Right knee  . PARTIAL GLOSSECTOMY  2017   at [facility]  . SHOULDER ARTHROSCOPY Right   . TOTAL SHOULDER ARTHROPLASTY Left 02/22/2015  . TOTAL SHOULDER ARTHROPLASTY Left 02/22/2015   Procedure: LEFT TOTAL SHOULDER ARTHROPLASTY;  Surgeon: Meredith Pel, MD;  Location: [place];  Service: Orthopedics;  Laterality: Left;   Social History   Occupational History  . Not on file  Tobacco Use  . Smoking status: Never Smoker  . Smokeless tobacco: Never Used  Substance and Sexual Activity  . Alcohol use: Yes    Alcohol/week: 3.0 standard drinks    Types: 3 Glasses of wine per week    Comment: occasional  . Drug use: No  . Sexual activity: Not Currently

## 2018-11-25 ENCOUNTER — Telehealth (INDEPENDENT_AMBULATORY_CARE_PROVIDER_SITE_OTHER): Payer: Self-pay | Admitting: Orthopedic Surgery

## 2018-11-25 NOTE — Telephone Encounter (Signed)
Please advise. Thanks.  

## 2018-11-25 NOTE — Telephone Encounter (Signed)
Ok for [employer] before procedure pls call thx

## 2018-11-25 NOTE — Telephone Encounter (Signed)
Mendel Ryder from [facility] called stating that the patient is scheduled for a procedure tomorrow.  She also stated that his PCP put him on Cephalexin on Friday, November 21, 2018 for a bad abscess.  Does Dr. Marlou Sa need to have the patient an additional antibiotic.  Patient had surgery with Dr. Marlou Sa several years ago.  [facility] CB#[phone].  Thank you.

## 2018-11-26 NOTE — Telephone Encounter (Signed)
IC advised per Dr Marlou Sa

## 2019-05-25 ENCOUNTER — Ambulatory Visit: Payer: Self-pay

## 2019-05-25 ENCOUNTER — Ambulatory Visit (INDEPENDENT_AMBULATORY_CARE_PROVIDER_SITE_OTHER): Payer: Medicare Other | Admitting: Orthopedic Surgery

## 2019-05-25 ENCOUNTER — Encounter: Payer: Self-pay | Admitting: Orthopedic Surgery

## 2019-05-25 DIAGNOSIS — M25562 Pain in left knee: Secondary | ICD-10-CM

## 2019-05-25 DIAGNOSIS — M1712 Unilateral primary osteoarthritis, left knee: Secondary | ICD-10-CM

## 2019-05-27 ENCOUNTER — Encounter: Payer: Self-pay | Admitting: Orthopedic Surgery

## 2019-05-27 NOTE — Progress Notes (Signed)
Office Visit Note   Patient: [name] Bell           Date of Birth: 02-20-1934           MRN: [id number] Visit Date: 05/25/2019 Requested by: Egbert Garibaldi, PA-C [address] [place],  [place] [address] PCP: Egbert Garibaldi, PA-C  Subjective: Chief Complaint  Patient presents with   Left Knee - Pain    HPI: La is a patient with left knee pain.  He also has shoulder arthritis.  Left knee is bothering him much.  Has been ambulating with a brace and compression sleeve.  Reports constant debilitating and borderline intractable pain.  He cannot really walk very much.  The pain wakes him from sleep at night.  Takes aspirin as needed.  Hemoglobin A1c was improved to 8.5 several weeks ago.  It is definitely trending downward.  Patient is now retired.  He only can walk about 100 feet with a cane before he has to stop.  He has had cancer surgery on his tongue about 3 years ago and has been doing well with that although eating at times is difficult.  Patient does want left total knee replacement.  He has had the right total knee performed and did well with that.              ROS: All systems reviewed are negative as they relate to the chief complaint within the history of present illness.  Patient denies  fevers or chills.   Assessment & Plan: Visit Diagnoses:  1. Left knee pain, unspecified chronicity   2. Arthritis of left knee     Plan: Impression is left knee arthritis.  Plan is left total knee replacement.  Risk and benefits are discussed include but limited to infection nerve vessel damage prolonged rehabilitation stiffness as well as incomplete pain relief.  Patient understands the risks and benefits and wishes to proceed.  I do want him to continue to work on getting that hemoglobin A1c down as close to or below 8 as possible.  Even though he is 76 I think he currently really cannot live with his situation the way it is.  Do anticipate home health physical therapy as well as [place]  physical therapy after he is up ambulating.  All questions answered.  Follow-Up Instructions: No follow-ups on file.   Orders:  Orders Placed This Encounter  Procedures   XR Knee 1-2 Views Left   No orders of the defined types were placed in this encounter.     Procedures: No procedures performed   Clinical Data: No additional findings.  Objective: Vital Signs: There were no vitals taken for this visit.  Physical Exam:   Constitutional: Patient appears well-developed HEENT:  Head: Normocephalic Eyes:EOM are normal Neck: Normal range of motion Cardiovascular: Normal rate Pulmonary/chest: Effort normal Neurologic: Patient is alert Skin: Skin is warm Psychiatric: Patient has normal mood and affect    Ortho Exam: Ortho exam demonstrates antalgic gait to the left with slight varus alignment.  Pedal pulses palpable.  No effusion is present.  He does have about a 5 degree flexion contracture on the left compared to the right.  He is able to bend to about 105 degrees.  Collateral crucial ligaments are stable.  No groin pain with internal X rotation of the leg.  No other masses lymphadenopathy or skin changes noted in that left knee region.  Specialty Comments:  No specialty comments available.  Imaging: No results  found.   PMFS History: Patient Active Problem List   Diagnosis Date Noted   Arthritis of right shoulder region 09/24/2016   Arthritis of shoulder region, left 02/22/2015   Type II or unspecified type diabetes mellitus without mention of complication, not stated as uncontrolled 02/11/2012   Hypogonadism male 02/11/2012   Arthritis of knee, right 10/30/2011   Past Medical History:  Diagnosis Date   Allergy    RHINITIS   Anxiety    Arthritis    osteoarthritis   Asthmatic bronchitis    seasonal   Cancer ([place])    skin cancer removed from back   Depression    Diabetes mellitus    type 2 Niddm x 6 yrs   FHx: cardiovascular disease     History of kidney stones    History of pneumonia    reports d/t bronchitis   Hx of colonic polyps    Hypogonadism male    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 No pertinent family history.  Past Surgical History:  Procedure Laterality Date   CHOLECYSTECTOMY  2010   GALL BLADDER   HERNIA REPAIR     during cholecystectomy   KNEE ARTHROPLASTY  10/30/2011   Procedure: COMPUTER ASSISTED TOTAL KNEE ARTHROPLASTY;  Surgeon: Meredith Pel, MD;  Location: [facility];  Service: Orthopedics;  Laterality: Right;  right total knee arthroplasty   KNEE CARTILAGE SURGERY  2009   Right knee   PARTIAL GLOSSECTOMY  2017   at [place] [facility] ARTHROSCOPY Right    TOTAL SHOULDER ARTHROPLASTY Left 02/22/2015   TOTAL SHOULDER ARTHROPLASTY Left 02/22/2015   Procedure: LEFT TOTAL SHOULDER ARTHROPLASTY;  Surgeon: Meredith Pel, MD;  Location: [place];  Service: Orthopedics;  Laterality: Left;   Social History   Occupational History   Not on file  Tobacco Use   Smoking status: Never Smoker   Smokeless tobacco: Never Used  Substance and Sexual Activity   Alcohol use: Yes    Alcohol/week: 3.0 standard drinks    Types: 3 Glasses of wine per week    Comment: occasional   Drug use: No   Sexual activity: Not Currently

## 2019-06-23 NOTE — Pre-Procedure Instructions (Signed)
JCION PROHASKA  06/23/2019      [facility] - [place], [place] - [address] [place] [address] Phone: [phone] Fax: [phone]    Your procedure is scheduled on June 30, 2019.  Report to [facility] Admitting at 1000 AM.  Call this number if you have problems the morning of surgery:  [phone]   Remember:  Do not eat  after midnight.  You may drink clear liquids until 900 AM.  Clear liquids allowed are:                    Water, Juice (non-citric and without pulp), Clear Tea, Black Coffee only and Gatorade  Please complete your Gatordade - G2 that was provided to you by 900 AM the morning of surgery.  Please, if able, drink it in one setting. DO NOT SIP.    Take these medicines the morning of surgery with A SIP OF WATER  Eye Drops-if needed  Follow your surgeon's instructions on when to hold/resume aspirin.  If no instructions were given call the office to determine how they would like to you take aspirin  7 days prior to surgery STOP taking any Aspirin (unless otherwise instructed by your surgeon), Aleve, Naproxen, Ibuprofen, Motrin, Advil, Goody's, BC's, all herbal medications, fish oil, and all vitamins  WHAT DO I DO ABOUT MY DIABETES MEDICATION?  Marland Kitchen Do not take oral diabetes medicines (pills) the morning of surgery-metformin (Glucophage)  . THE NIGHT BEFORE SURGERY, take 15 units of Humalin N . insulin.       . THE MORNING OF SURGERY, take 15 units of Humalin N insulin.  . The day of surgery, do not take other diabetes injectables, including Byetta (exenatide), Bydureon (exenatide ER), Victoza (liraglutide), or Trulicity (dulaglutide).  . If your CBG is greater than 220 mg/dL, you may take  of your sliding scale (correction) dose of insulin.  Reviewed and Endorsed by [facility] Patient Education Committee, August 2015  How to Manage Your Diabetes Before and After Surgery  Why is it important to  control my blood sugar before and after surgery? . Improving blood sugar levels before and after surgery helps healing and can limit problems. . A way of improving blood sugar control is eating a healthy diet by: o  Eating less sugar and carbohydrates o  Increasing activity/exercise o  Talking with your doctor about reaching your blood sugar goals . High blood sugars (greater than 180 mg/dL) can raise your risk of infections and slow your recovery, so you will need to focus on controlling your diabetes during the weeks before surgery. . Make sure that the doctor who takes care of your diabetes knows about your planned surgery including the date and location.  How do I manage my blood sugar before surgery? . Check your blood sugar at least 4 times a day, starting 2 days before surgery, to make sure that the level is not too high or low. o Check your blood sugar the morning of your surgery when you wake up and every 2 hours until you get to the Short Stay unit. . If your blood sugar is less than 70 mg/dL, you will need to treat for low blood sugar: o Do not take insulin. o Treat a low blood sugar (less than 70 mg/dL) with  cup of clear juice (cranberry or apple), 4 glucose tablets, OR glucose gel. Recheck blood sugar in 15 minutes after  treatment (to make sure it is greater than 70 mg/dL). If your blood sugar is not greater than 70 mg/dL on recheck, call [phone] o  for further instructions. . Report your blood sugar to the short stay nurse when you get to Short Stay.  . If you are admitted to the hospital after surgery: o Your blood sugar will be checked by the staff and you will probably be given insulin after surgery (instead of oral diabetes medicines) to make sure you have good blood sugar levels. o The goal for blood sugar control after surgery is 80-180 mg/dL.   [facility]- Preparing For Surgery  Before surgery, you can play an important role. Because skin is not sterile, your  skin needs to be as free of germs as possible. You can reduce the number of germs on your skin by washing with CHG (chlorahexidine gluconate) Soap before surgery.  CHG is an antiseptic cleaner which kills germs and bonds with the skin to continue killing germs even after washing.    Oral Hygiene is also important to reduce your risk of infection.  Remember - BRUSH YOUR TEETH THE MORNING OF SURGERY WITH YOUR REGULAR TOOTHPASTE  Please do not use if you have an allergy to CHG or antibacterial soaps. If your skin becomes reddened/irritated stop using the CHG.  Do not shave (including legs and underarms) for at least 48 hours prior to first CHG shower. It is OK to shave your face.  Please follow these instructions carefully.   1. Shower the NIGHT BEFORE SURGERY and the MORNING OF SURGERY with CHG.   2. If you chose to wash your hair, wash your hair first as usual with your normal shampoo.  3. After you shampoo, rinse your hair and body thoroughly to remove the shampoo.  4. Use CHG as you would any other liquid soap. You can apply CHG directly to the skin and wash gently with a scrungie or a clean washcloth.   5. Apply the CHG Soap to your body ONLY FROM THE NECK DOWN.  Do not use on open wounds or open sores. Avoid contact with your eyes, ears, mouth and genitals (private parts). Wash Face and genitals (private parts)  with your normal soap.  6. Wash thoroughly, paying special attention to the area where your surgery will be performed.  7. Thoroughly rinse your body with warm water from the neck down.  8. DO NOT shower/wash with your normal soap after using and rinsing off the CHG Soap.  9. Pat yourself dry with a CLEAN TOWEL.  10. Wear CLEAN PAJAMAS to bed the night before surgery, wear comfortable clothes the morning of surgery  11. Place CLEAN SHEETS on your bed the night of your first shower and DO NOT SLEEP WITH PETS.  Day of Surgery:  Do not apply any deodorants/lotions.  Please  wear clean clothes to the hospital/surgery center.   Remember to brush your teeth WITH YOUR REGULAR TOOTHPASTE.    Do not wear jewelry  Do not wear lotions, powders, or colognes, or deodorant.             Men may shave face and neck.  Do not bring valuables to the hospital.  [facility] is not responsible for any belongings or valuables.  IF you are a smoker, DO NOT Smoke 24 hours prior to surgery   IF you wear a CPAP at night please bring your mask, tubing, and machine the morning of surgery    Remember that  you must have someone to transport you home after your surgery, and remain with you for 24 hours if you are discharged the same day.  Contacts, dentures or bridgework may not be worn into surgery.  Leave your suitcase in the car.  After surgery it may be brought to your room.  For patients admitted to the hospital, discharge time will be determined by your treatment team.  Patients discharged the day of surgery will not be allowed to drive home.   Please read over the following fact sheets that you were given.

## 2019-06-24 ENCOUNTER — Encounter (HOSPITAL_COMMUNITY)
Admission: RE | Admit: 2019-06-24 | Discharge: 2019-06-24 | Disposition: A | Discharge status: Home / Self Care | Payer: Medicare Other | Source: Ambulatory Visit | Attending: Orthopedic Surgery | Admitting: Orthopedic Surgery

## 2019-06-24 ENCOUNTER — Other Ambulatory Visit: Payer: Self-pay

## 2019-06-24 ENCOUNTER — Encounter (HOSPITAL_COMMUNITY): Payer: Self-pay

## 2019-06-24 DIAGNOSIS — Z01810 Encounter for preprocedural cardiovascular examination: Secondary | ICD-10-CM | POA: Diagnosis not present

## 2019-06-24 DIAGNOSIS — R9431 Abnormal electrocardiogram [ECG] [EKG]: Secondary | ICD-10-CM | POA: Insufficient documentation

## 2019-06-24 DIAGNOSIS — Z01812 Encounter for preprocedural laboratory examination: Secondary | ICD-10-CM | POA: Insufficient documentation

## 2019-06-24 LAB — URINALYSIS, ROUTINE W REFLEX MICROSCOPIC
Bacteria, UA: NONE SEEN
Bilirubin Urine: NEGATIVE
Glucose, UA: >=500 mg/dL — AB
Hgb urine dipstick: NEGATIVE
Ketones, ur: 5 mg/dL — AB
Leukocytes,Ua: NEGATIVE
Nitrite: NEGATIVE
Protein, ur: NEGATIVE mg/dL
Specific Gravity, Urine: 1.013 (ref 1.005–1.030)
pH: 5 (ref 5.0–8.0)

## 2019-06-24 LAB — BASIC METABOLIC PANEL
Anion gap: 9 (ref 5–15)
BUN: 20 mg/dL (ref 8–23)
CO2: 22 mmol/L (ref 22–32)
Calcium: 9.2 mg/dL (ref 8.9–10.3)
Chloride: 102 mmol/L (ref 98–111)
Creatinine, Ser: 0.96 mg/dL (ref 0.61–1.24)
GFR calc Af Amer: >60 mL/min (ref >60)
GFR calc non Af Amer: >60 mL/min (ref >60)
Glucose, Bld: 232 mg/dL — ABNORMAL HIGH (ref 70–99)
Potassium: 4.2 mmol/L (ref 3.5–5.1)
Sodium: 133 mmol/L — ABNORMAL LOW (ref 135–145)

## 2019-06-24 LAB — CBC
HCT: 42.8 % (ref 39.0–52.0)
Hemoglobin: 14 g/dL (ref 13.0–17.0)
MCH: 32.2 pg (ref 26.0–34.0)
MCHC: 32.7 g/dL (ref 30.0–36.0)
MCV: 98.4 fL (ref 80.0–100.0)
Platelets: 241 10*3/uL (ref 150–400)
RBC: 4.35 MIL/uL (ref 4.22–5.81)
RDW: 13.4 % (ref 11.5–15.5)
WBC: 6.4 10*3/uL (ref 4.0–10.5)
nRBC: 0 % (ref 0.0–0.2)

## 2019-06-24 LAB — SURGICAL PCR SCREEN
MRSA, PCR: NEGATIVE
Staphylococcus aureus: NEGATIVE

## 2019-06-24 LAB — GLUCOSE, CAPILLARY: Glucose-Capillary: 232 mg/dL — ABNORMAL HIGH (ref 70–99)

## 2019-06-24 NOTE — Progress Notes (Signed)
D o we have latest a1c

## 2019-06-24 NOTE — Progress Notes (Signed)
PCP - Dr. Corena Pilgrim, MD Cardiologist - denies Endocrinologist - Dr. Campbell Riches, MD [facility])  Chest x-ray - N/A EKG - 06/24/2019 Stress Test - greater than 10 years ago ECHO - 05/2019 - requesting records from [place] denies  Sleep Study - ? 5 years ago; requesting records from [facility] CPAP - denies  Fasting Blood Sugar - 115-150 Checks Blood Sugar every morning  Blood Thinner Instructions: N/A Aspirin Instructions: stopped taking 18 months ago  ERAS Protcol - YES PRE-SURGERY Ensure - given Gatorade G2  Anesthesia review: Yes; complications of anesthesia w/excessive post-op n/v; states he ended up having to stay in hospital x 8 days after this episode approx 5 years ago; HgbA1C cancelled surgery years ago - rechecking today  COVID TEST- scheduled 06/26/2019; patient states verbal understanding of need for self-quarantine post testing  Coronavirus Screening  Have you experienced the following symptoms:  Cough yes/no: No Fever (>100.42F)  yes/no: No Runny nose yes/no: No Sore throat yes/no: No Difficulty breathing/shortness of breath  yes/no: No  Have you or a family member traveled in the last 14 days and where? yes/no: No   If the patient indicates "YES" to the above questions, their PAT will be rescheduled to limit the exposure to others and, the surgeon will be notified. THE PATIENT WILL NEED TO BE ASYMPTOMATIC FOR 14 DAYS.   If the patient is not experiencing any of these symptoms, the PAT nurse will instruct them to NOT bring anyone with them to their appointment since they may have these symptoms or traveled as well.   Please remind your patients and families that hospital visitation restrictions are in effect and the importance of the restrictions.   Patient denies shortness of breath, fever, cough and chest pain at PAT appointment   Patient verbalized understanding of instructions that were given to them at the PAT  appointment. Patient was also instructed that they will need to review over the PAT instructions again at home before surgery.

## 2019-06-25 ENCOUNTER — Telehealth: Payer: Self-pay | Admitting: Orthopedic Surgery

## 2019-06-25 ENCOUNTER — Encounter (HOSPITAL_COMMUNITY): Payer: Self-pay

## 2019-06-25 LAB — HEMOGLOBIN A1C
Hgb A1c MFr Bld: 8.4 % — ABNORMAL HIGH (ref 4.8–5.6)
Mean Plasma Glucose: 194 mg/dL

## 2019-06-25 LAB — URINE CULTURE: Culture: NO GROWTH

## 2019-06-25 NOTE — Telephone Encounter (Signed)
Jeneen Rinks w/short stay Anesthesia calling in reference to patient's A1c. Labs done yesterday and he is at 8.4.  Patient is scheduled for this Tuesday 06-30-19.  Please advise.

## 2019-06-25 NOTE — Anesthesia Preprocedure Evaluation (Addendum)
Anesthesia Evaluation  Patient identified by MRN, date of birth, ID band Patient awake    Reviewed: Allergy & Precautions, NPO status , Patient's Chart, lab work & pertinent test results  History of Anesthesia Complications (+) PONV and history of anesthetic complications  Airway Mallampati: II  TM Distance: >3 FB Neck ROM: Full    Dental  (+) Dental Advisory Given   Pulmonary asthma ,    breath sounds clear to auscultation       Cardiovascular Exercise Tolerance: Good  Rhythm:Regular     Neuro/Psych negative neurological ROS  negative psych ROS   GI/Hepatic negative GI ROS, Neg liver ROS,   Endo/Other  diabetes  Renal/GU negative Renal ROS     Musculoskeletal  (+) Arthritis ,   Abdominal   Peds  Hematology   Anesthesia Other Findings   Reproductive/Obstetrics negative OB ROS                           Anesthesia Physical Anesthesia Plan  ASA: III  Anesthesia Plan: Spinal and Regional   Post-op Pain Management:  Regional for Post-op pain   Induction:   PONV Risk Score and Plan: 2 and Treatment may vary due to age or medical condition, Ondansetron and Dexamethasone  Airway Management Planned: Nasal Cannula and Natural Airway  Additional Equipment: None  Intra-op Plan:   Post-operative Plan:   Informed Consent:     Dental advisory given  Plan Discussed with: CRNA and Anesthesiologist  Anesthesia Plan Comments: (Reviewed sleep study from 2012 which showed moderate OSA with AHI 19.5. Pt denies current use of CPAP. Review of PCP notes also does not list diagnosis of OSA.   Recent echo was ordered for eval of abnormal EKG. TTE 05/01/19 showed mild concentric left ventricular hypertrophy.  Normal cardiac chamber sizes and function; EF 55-60%; normal valve anatomy and function; no pericardial effusion or intracardiac mass.  Normal thoracic aorta and aortic arch. Copy of results  on pt chart.   Pt was scheduled for total shoulder last year but cancelled due to uncontrolled DMII (A1c 11.8 on 02/10/18). Repeat A1c 06/24/19 8.4. Result called to Dr. Randel Pigg office.  Sp w L adductor canal block)   Anesthesia Quick Evaluation

## 2019-06-26 ENCOUNTER — Other Ambulatory Visit (HOSPITAL_COMMUNITY)
Admission: RE | Admit: 2019-06-26 | Discharge: 2019-06-26 | Disposition: A | Discharge status: Home / Self Care | Payer: Medicare Other | Source: Ambulatory Visit | Attending: Orthopedic Surgery | Admitting: Orthopedic Surgery

## 2019-06-26 DIAGNOSIS — Z20828 Contact with and (suspected) exposure to other viral communicable diseases: Secondary | ICD-10-CM | POA: Insufficient documentation

## 2019-06-26 DIAGNOSIS — Z01812 Encounter for preprocedural laboratory examination: Secondary | ICD-10-CM | POA: Insufficient documentation

## 2019-06-26 NOTE — Telephone Encounter (Signed)
Please advise. Thanks.  

## 2019-06-27 LAB — NOVEL CORONAVIRUS, NAA (HOSP ORDER, SEND-OUT TO REF LAB; TAT 18-24 HRS): SARS-CoV-2, NAA: NOT DETECTED

## 2019-06-29 NOTE — Telephone Encounter (Signed)
I just called and talked to [facility].  Told him that his hemoglobin A1c was slightly elevated.  It is trending down compared to last year.  He is in severe pain.  I think with his slight elevation we can do the surgery and keep him on IV antibiotics for likely 3 doses postop as well as put vancomycin powder in the knee.  I did tell him that he is at slightly higher risk for infection due to his A1c but he would like to proceed anyway.  He has had a lot of people who have taken time off to make arrangements.

## 2019-06-30 ENCOUNTER — Encounter (HOSPITAL_COMMUNITY)
Admission: RE | Disposition: A | Discharge status: Home / Self Care | Payer: Self-pay | Source: Home / Self Care | Attending: Orthopedic Surgery

## 2019-06-30 ENCOUNTER — Ambulatory Visit (HOSPITAL_COMMUNITY): Payer: Medicare Other | Admitting: Physician Assistant

## 2019-06-30 ENCOUNTER — Encounter (HOSPITAL_COMMUNITY): Payer: Self-pay | Admitting: Anesthesiology

## 2019-06-30 ENCOUNTER — Ambulatory Visit (HOSPITAL_COMMUNITY): Payer: Medicare Other | Admitting: Anesthesiology

## 2019-06-30 ENCOUNTER — Observation Stay (HOSPITAL_COMMUNITY)
Admission: RE | Admit: 2019-06-30 | Discharge: 2019-07-02 | Disposition: A | Discharge status: Home / Self Care | Payer: Medicare Other | Attending: Orthopedic Surgery | Admitting: Orthopedic Surgery

## 2019-06-30 ENCOUNTER — Other Ambulatory Visit: Payer: Self-pay

## 2019-06-30 DIAGNOSIS — J45909 Unspecified asthma, uncomplicated: Secondary | ICD-10-CM | POA: Insufficient documentation

## 2019-06-30 DIAGNOSIS — Z85828 Personal history of other malignant neoplasm of skin: Secondary | ICD-10-CM | POA: Insufficient documentation

## 2019-06-30 DIAGNOSIS — E119 Type 2 diabetes mellitus without complications: Secondary | ICD-10-CM | POA: Insufficient documentation

## 2019-06-30 DIAGNOSIS — Z96612 Presence of left artificial shoulder joint: Secondary | ICD-10-CM | POA: Diagnosis not present

## 2019-06-30 DIAGNOSIS — Z79899 Other long term (current) drug therapy: Secondary | ICD-10-CM | POA: Insufficient documentation

## 2019-06-30 DIAGNOSIS — M1712 Unilateral primary osteoarthritis, left knee: Principal | ICD-10-CM | POA: Diagnosis present

## 2019-06-30 DIAGNOSIS — Z96611 Presence of right artificial shoulder joint: Secondary | ICD-10-CM | POA: Diagnosis not present

## 2019-06-30 DIAGNOSIS — Z794 Long term (current) use of insulin: Secondary | ICD-10-CM | POA: Insufficient documentation

## 2019-06-30 DIAGNOSIS — F419 Anxiety disorder, unspecified: Secondary | ICD-10-CM | POA: Insufficient documentation

## 2019-06-30 DIAGNOSIS — Z7982 Long term (current) use of aspirin: Secondary | ICD-10-CM | POA: Diagnosis not present

## 2019-06-30 HISTORY — PX: TOTAL KNEE ARTHROPLASTY: SHX125

## 2019-06-30 HISTORY — DX: Dependence on other enabling machines and devices: Z99.89

## 2019-06-30 LAB — GLUCOSE, CAPILLARY
Glucose-Capillary: 153 mg/dL — ABNORMAL HIGH (ref 70–99)
Glucose-Capillary: 136 mg/dL — ABNORMAL HIGH (ref 70–99)
Glucose-Capillary: 186 mg/dL — ABNORMAL HIGH (ref 70–99)

## 2019-06-30 SURGERY — ARTHROPLASTY, KNEE, TOTAL
Anesthesia: Spinal | Laterality: Left

## 2019-06-30 MED ORDER — FENTANYL CITRATE (PF) 100 MCG/2ML IJ SOLN
50.0000 ug | Freq: Once | INTRAMUSCULAR | Status: AC
  Administered 2019-06-30: 18:00:00 50 ug via INTRAVENOUS

## 2019-06-30 MED ORDER — TRANEXAMIC ACID 1000 MG/10ML IV SOLN
2000.0000 mg | Freq: Once | INTRAVENOUS | Status: AC
  Administered 2019-06-30: 2000 mg via TOPICAL
  Filled 2019-06-30: qty 20

## 2019-06-30 MED ORDER — MORPHINE SULFATE (PF) 4 MG/ML IV SOLN
INTRAVENOUS | Status: DC | PRN
  Administered 2019-06-30: 8 mg via INTRAVENOUS

## 2019-06-30 MED ORDER — CHLORHEXIDINE GLUCONATE 4 % EX LIQD: 60.0000 mL | Freq: Once | CUTANEOUS | Status: DC

## 2019-06-30 MED ORDER — 0.9 % SODIUM CHLORIDE (POUR BTL) OPTIME
TOPICAL | Status: DC | PRN
  Administered 2019-06-30 (×2): 1000 mL

## 2019-06-30 MED ORDER — MORPHINE SULFATE (PF) 4 MG/ML IV SOLN
INTRAVENOUS | Status: AC
  Filled 2019-06-30: qty 2

## 2019-06-30 MED ORDER — CEFAZOLIN SODIUM-DEXTROSE 2-4 GM/100ML-% IV SOLN
INTRAVENOUS | Status: AC
  Filled 2019-06-30: qty 100

## 2019-06-30 MED ORDER — TRANEXAMIC ACID-NACL 1000-0.7 MG/100ML-% IV SOLN
INTRAVENOUS | Status: AC
  Filled 2019-06-30: qty 100

## 2019-06-30 MED ORDER — BUPIVACAINE HCL (PF) 0.75 % IJ SOLN
INTRAMUSCULAR | Status: DC | PRN
  Administered 2019-06-30: 20 mL via PERINEURAL

## 2019-06-30 MED ORDER — BUPIVACAINE IN DEXTROSE 0.75-8.25 % IT SOLN
INTRATHECAL | Status: DC | PRN
  Administered 2019-06-30: 2 mL via INTRATHECAL

## 2019-06-30 MED ORDER — VANCOMYCIN HCL 1000 MG IV SOLR
INTRAVENOUS | Status: DC | PRN
  Administered 2019-06-30: 1000 mg

## 2019-06-30 MED ORDER — PROPOFOL 10 MG/ML IV BOLUS
INTRAVENOUS | Status: DC | PRN
  Administered 2019-06-30: 30 mg via INTRAVENOUS

## 2019-06-30 MED ORDER — BUPIVACAINE-EPINEPHRINE 0.25% -1:200000 IJ SOLN
INTRAMUSCULAR | Status: AC
  Filled 2019-06-30: qty 1

## 2019-06-30 MED ORDER — LACTATED RINGERS IV SOLN
INTRAVENOUS | Status: DC
  Administered 2019-06-30: 23:00:00 via INTRAVENOUS

## 2019-06-30 MED ORDER — OXYCODONE HCL 5 MG PO TABS
5.0000 mg | ORAL_TABLET | ORAL | Status: DC | PRN
  Administered 2019-07-01: 5 mg via ORAL
  Administered 2019-07-01 – 2019-07-02 (×4): 10 mg via ORAL
  Filled 2019-06-30 (×5): qty 2

## 2019-06-30 MED ORDER — HYDROMORPHONE HCL 1 MG/ML IJ SOLN: 0.5000 mg | INTRAMUSCULAR | Status: DC | PRN

## 2019-06-30 MED ORDER — MENTHOL 3 MG MT LOZG: 1.0000 | LOZENGE | OROMUCOSAL | Status: DC | PRN

## 2019-06-30 MED ORDER — METHOCARBAMOL 500 MG PO TABS
500.0000 mg | ORAL_TABLET | Freq: Four times a day (QID) | ORAL | Status: DC | PRN
  Administered 2019-07-01: 500 mg via ORAL
  Filled 2019-06-30: qty 1

## 2019-06-30 MED ORDER — POVIDONE-IODINE 10 % EX SWAB: 2.0000 "application " | Freq: Once | CUTANEOUS | Status: DC

## 2019-06-30 MED ORDER — ONDANSETRON HCL 4 MG/2ML IJ SOLN: 4.0000 mg | Freq: Four times a day (QID) | INTRAMUSCULAR | Status: DC | PRN

## 2019-06-30 MED ORDER — NON FORMULARY
Status: DC | PRN
  Administered 2019-06-30: 20:00:00 450 mL

## 2019-06-30 MED ORDER — PROPOFOL 500 MG/50ML IV EMUL
INTRAVENOUS | Status: DC | PRN
  Administered 2019-06-30: 75 ug/kg/min via INTRAVENOUS

## 2019-06-30 MED ORDER — BUPIVACAINE LIPOSOME 1.3 % IJ SUSP
20.0000 mL | Freq: Once | INTRAMUSCULAR | Status: AC
  Administered 2019-06-30: 20:00:00 20 mL
  Filled 2019-06-30: qty 20

## 2019-06-30 MED ORDER — BUPIVACAINE-EPINEPHRINE (PF) 0.25% -1:200000 IJ SOLN
INTRAMUSCULAR | Status: DC | PRN
  Administered 2019-06-30: 20 mL via PERINEURAL
  Administered 2019-06-30: 15 mL

## 2019-06-30 MED ORDER — METOCLOPRAMIDE HCL 5 MG/ML IJ SOLN: 5.0000 mg | Freq: Three times a day (TID) | INTRAMUSCULAR | Status: DC | PRN

## 2019-06-30 MED ORDER — SODIUM CHLORIDE 0.9% FLUSH
INTRAVENOUS | Status: DC | PRN
  Administered 2019-06-30: 20 mL

## 2019-06-30 MED ORDER — SODIUM CHLORIDE 0.9 % IV SOLN
INTRAVENOUS | Status: DC | PRN
  Administered 2019-06-30: 18:00:00 25 ug/min via INTRAVENOUS

## 2019-06-30 MED ORDER — CLONIDINE HCL (ANALGESIA) 100 MCG/ML EP SOLN
EPIDURAL | Status: DC | PRN
  Administered 2019-06-30: 50 ug

## 2019-06-30 MED ORDER — DOCUSATE SODIUM 100 MG PO CAPS
100.0000 mg | ORAL_CAPSULE | Freq: Two times a day (BID) | ORAL | Status: DC
  Administered 2019-06-30 – 2019-07-02 (×4): 100 mg via ORAL
  Filled 2019-06-30 (×4): qty 1

## 2019-06-30 MED ORDER — CEFAZOLIN SODIUM-DEXTROSE 2-4 GM/100ML-% IV SOLN
2.0000 g | INTRAVENOUS | Status: AC
  Administered 2019-06-30: 18:00:00 2 g via INTRAVENOUS

## 2019-06-30 MED ORDER — ACETAMINOPHEN 325 MG PO TABS
325.0000 mg | ORAL_TABLET | Freq: Four times a day (QID) | ORAL | Status: DC | PRN
  Administered 2019-07-02: 08:00:00 650 mg via ORAL
  Filled 2019-06-30: qty 2

## 2019-06-30 MED ORDER — PHENOL 1.4 % MT LIQD: 1.0000 | OROMUCOSAL | Status: DC | PRN

## 2019-06-30 MED ORDER — TRANEXAMIC ACID-NACL 1000-0.7 MG/100ML-% IV SOLN
1000.0000 mg | INTRAVENOUS | Status: AC
  Administered 2019-06-30: 18:00:00 1000 mg via INTRAVENOUS

## 2019-06-30 MED ORDER — ONDANSETRON HCL 4 MG PO TABS: 4.0000 mg | ORAL_TABLET | Freq: Four times a day (QID) | ORAL | Status: DC | PRN

## 2019-06-30 MED ORDER — ONDANSETRON HCL 4 MG/2ML IJ SOLN
INTRAMUSCULAR | Status: DC | PRN
  Administered 2019-06-30: 4 mg via INTRAVENOUS

## 2019-06-30 MED ORDER — METHOCARBAMOL 1000 MG/10ML IJ SOLN
500.0000 mg | Freq: Four times a day (QID) | INTRAVENOUS | Status: DC | PRN
  Filled 2019-06-30: qty 5

## 2019-06-30 MED ORDER — LACTATED RINGERS IV SOLN
INTRAVENOUS | Status: DC
  Administered 2019-06-30 (×2): via INTRAVENOUS

## 2019-06-30 MED ORDER — DEXAMETHASONE SODIUM PHOSPHATE 10 MG/ML IJ SOLN
INTRAMUSCULAR | Status: DC | PRN
  Administered 2019-06-30: 10 mg via INTRAVENOUS

## 2019-06-30 MED ORDER — SODIUM CHLORIDE 0.9 % IR SOLN
Status: DC | PRN
  Administered 2019-06-30: 3000 mL

## 2019-06-30 MED ORDER — METOCLOPRAMIDE HCL 5 MG PO TABS: 5.0000 mg | ORAL_TABLET | Freq: Three times a day (TID) | ORAL | Status: DC | PRN

## 2019-06-30 MED ORDER — MIDAZOLAM HCL 2 MG/2ML IJ SOLN
INTRAMUSCULAR | Status: AC
  Filled 2019-06-30: qty 2

## 2019-06-30 MED ORDER — CLONIDINE HCL (ANALGESIA) 100 MCG/ML EP SOLN
EPIDURAL | Status: DC | PRN
  Administered 2019-06-30: 1 mL

## 2019-06-30 MED ORDER — OXYCODONE HCL 5 MG PO TABS: 10.0000 mg | ORAL_TABLET | ORAL | Status: DC | PRN

## 2019-06-30 MED ORDER — FENTANYL CITRATE (PF) 100 MCG/2ML IJ SOLN
INTRAMUSCULAR | Status: AC
  Administered 2019-06-30: 50 ug via INTRAVENOUS
  Filled 2019-06-30: qty 2

## 2019-06-30 MED ORDER — ASPIRIN 81 MG PO CHEW
81.0000 mg | CHEWABLE_TABLET | Freq: Every day | ORAL | Status: DC
  Administered 2019-06-30 – 2019-07-02 (×3): 81 mg via ORAL
  Filled 2019-06-30 (×3): qty 1

## 2019-06-30 MED ORDER — NAPROXEN 250 MG PO TABS
250.0000 mg | ORAL_TABLET | Freq: Two times a day (BID) | ORAL | Status: DC
  Administered 2019-07-01 – 2019-07-02 (×3): 250 mg via ORAL
  Filled 2019-06-30 (×4): qty 1

## 2019-06-30 MED ORDER — CEFAZOLIN SODIUM-DEXTROSE 2-4 GM/100ML-% IV SOLN
2.0000 g | Freq: Four times a day (QID) | INTRAVENOUS | Status: AC
  Administered 2019-06-30 – 2019-07-01 (×2): 2 g via INTRAVENOUS
  Filled 2019-06-30 (×2): qty 100

## 2019-06-30 MED ORDER — VANCOMYCIN HCL 1000 MG IV SOLR
INTRAVENOUS | Status: AC
  Filled 2019-06-30: qty 1000

## 2019-06-30 SURGICAL SUPPLY — 86 items
BAG DECANTER FOR FLEXI CONT (MISCELLANEOUS) ×3 IMPLANT
BANDAGE ESMARK 6X9 LF (GAUZE/BANDAGES/DRESSINGS) ×1 IMPLANT
BLADE SAG 18X100X1.27 (BLADE) ×6 IMPLANT
BNDG COHESIVE 6X5 TAN STRL LF (GAUZE/BANDAGES/DRESSINGS) ×3 IMPLANT
BNDG ELASTIC 4X5.8 VLCR STR LF (GAUZE/BANDAGES/DRESSINGS) ×3 IMPLANT
BNDG ELASTIC 6X10 VLCR STRL LF (GAUZE/BANDAGES/DRESSINGS) ×3 IMPLANT
BNDG ELASTIC 6X15 VLCR STRL LF (GAUZE/BANDAGES/DRESSINGS) ×3 IMPLANT
BNDG ESMARK 6X9 LF (GAUZE/BANDAGES/DRESSINGS) ×3
BOWL SMART MIX CTS (DISPOSABLE) ×3 IMPLANT
CEMENT BONE SIMPLEX SPEEDSET (Cement) IMPLANT
CLOSURE WOUND 1/2 X4 (GAUZE/BANDAGES/DRESSINGS) ×2
COMP FEMORAL TRIATHLON SZ3 (Joint) ×3 IMPLANT
COMPONENT FEMRL TRIATHLON SZ3 (Joint) ×1 IMPLANT
CONT SPEC 4OZ CLIKSEAL STRL BL (MISCELLANEOUS) ×6 IMPLANT
COVER SURGICAL LIGHT HANDLE (MISCELLANEOUS) ×3 IMPLANT
COVER WAND RF STERILE (DRAPES) ×3 IMPLANT
CUFF TOURN SGL QUICK 34 (TOURNIQUET CUFF) ×2
CUFF TRNQT CYL 34X4.125X (TOURNIQUET CUFF) ×1 IMPLANT
DECANTER SPIKE VIAL GLASS SM (MISCELLANEOUS) ×3 IMPLANT
DRAPE INCISE IOBAN 66X45 STRL (DRAPES) ×3 IMPLANT
DRAPE ORTHO SPLIT 77X108 STRL (DRAPES) ×6
DRAPE SURG ORHT 6 SPLT 77X108 (DRAPES) ×3 IMPLANT
DRAPE U-SHAPE 47X51 STRL (DRAPES) ×3 IMPLANT
DRSG AQUACEL AG ADV 3.5X10 (GAUZE/BANDAGES/DRESSINGS) ×3 IMPLANT
DRSG AQUACEL AG ADV 3.5X14 (GAUZE/BANDAGES/DRESSINGS) ×3 IMPLANT
DURAPREP 26ML APPLICATOR (WOUND CARE) ×6 IMPLANT
ELECT CAUTERY BLADE 6.4 (BLADE) ×3 IMPLANT
ELECT REM PT RETURN 9FT ADLT (ELECTROSURGICAL) ×3
ELECTRODE REM PT RTRN 9FT ADLT (ELECTROSURGICAL) ×1 IMPLANT
GAUZE SPONGE 4X4 12PLY STRL (GAUZE/BANDAGES/DRESSINGS) ×3 IMPLANT
GLOVE BIOGEL PI IND STRL 7.0 (GLOVE) ×3 IMPLANT
GLOVE BIOGEL PI IND STRL 7.5 (GLOVE) ×1 IMPLANT
GLOVE BIOGEL PI IND STRL 8 (GLOVE) ×1 IMPLANT
GLOVE BIOGEL PI INDICATOR 7.0 (GLOVE) ×6
GLOVE BIOGEL PI INDICATOR 7.5 (GLOVE) ×2
GLOVE BIOGEL PI INDICATOR 8 (GLOVE) ×2
GLOVE ECLIPSE 7.0 STRL STRAW (GLOVE) ×3 IMPLANT
GLOVE SURG ORTHO 8.0 STRL STRW (GLOVE) ×3 IMPLANT
GLOVE SURG SS PI 6.5 STRL IVOR (GLOVE) ×3 IMPLANT
GOWN STRL REUS W/ TWL LRG LVL3 (GOWN DISPOSABLE) ×3 IMPLANT
GOWN STRL REUS W/TWL LRG LVL3 (GOWN DISPOSABLE) ×6
HANDPIECE INTERPULSE COAX TIP (DISPOSABLE) ×2
HOOD PEEL AWAY FACE SHEILD DIS (HOOD) ×3 IMPLANT
HOOD PEEL AWAY FLYTE STAYCOOL (MISCELLANEOUS) ×9 IMPLANT
IMMOBILIZER KNEE 20 (SOFTGOODS)
IMMOBILIZER KNEE 20 THIGH 36 (SOFTGOODS) IMPLANT
IMMOBILIZER KNEE 22 UNIV (SOFTGOODS) ×3 IMPLANT
IMMOBILIZER KNEE 24 THIGH 36 (MISCELLANEOUS) IMPLANT
IMMOBILIZER KNEE 24 UNIV (MISCELLANEOUS)
INSERT TRIATH X3 SZ4 9 (Insert) ×3 IMPLANT
JET LAVAGE IRRISEPT WOUND (IRRIGATION / IRRIGATOR) ×3
KIT BASIN OR (CUSTOM PROCEDURE TRAY) ×3 IMPLANT
KIT TURNOVER KIT B (KITS) ×3 IMPLANT
KNEE PATELLA ASYMMETRIC 10X32 (Knees) ×3 IMPLANT
KNEE TIBIAL COMP TRI SZ4 (Knees) ×3 IMPLANT
LAVAGE JET IRRISEPT WOUND (IRRIGATION / IRRIGATOR) ×1 IMPLANT
MANIFOLD NEPTUNE II (INSTRUMENTS) ×3 IMPLANT
NEEDLE 22X1 1/2 (OR ONLY) (NEEDLE) ×6 IMPLANT
NEEDLE SPNL 18GX3.5 QUINCKE PK (NEEDLE) ×3 IMPLANT
NS IRRIG 1000ML POUR BTL (IV SOLUTION) ×6 IMPLANT
PACK TOTAL JOINT (CUSTOM PROCEDURE TRAY) ×3 IMPLANT
PAD ARMBOARD 7.5X6 YLW CONV (MISCELLANEOUS) ×6 IMPLANT
PAD CAST 4YDX4 CTTN HI CHSV (CAST SUPPLIES) ×1 IMPLANT
PADDING CAST ABS 6INX4YD NS (CAST SUPPLIES) ×2
PADDING CAST ABS COTTON 6X4 NS (CAST SUPPLIES) ×1 IMPLANT
PADDING CAST COTTON 4X4 STRL (CAST SUPPLIES) ×2
PADDING CAST COTTON 6X4 STRL (CAST SUPPLIES) ×3 IMPLANT
PIN FLUTED HEDLESS FIX 3.5X1/8 (PIN) ×3 IMPLANT
SET HNDPC FAN SPRY TIP SCT (DISPOSABLE) ×1 IMPLANT
STRIP CLOSURE SKIN 1/2X4 (GAUZE/BANDAGES/DRESSINGS) ×4 IMPLANT
SUCTION FRAZIER HANDLE 10FR (MISCELLANEOUS) ×2
SUCTION TUBE FRAZIER 10FR DISP (MISCELLANEOUS) ×1 IMPLANT
SUT MNCRL AB 3-0 PS2 18 (SUTURE) ×3 IMPLANT
SUT VIC AB 0 CT1 27 (SUTURE) ×6
SUT VIC AB 0 CT1 27XBRD ANBCTR (SUTURE) ×3 IMPLANT
SUT VIC AB 1 CT1 27 (SUTURE) ×10
SUT VIC AB 1 CT1 27XBRD ANBCTR (SUTURE) ×5 IMPLANT
SUT VIC AB 1 CT1 36 (SUTURE) ×3 IMPLANT
SUT VIC AB 2-0 CT1 27 (SUTURE) ×8
SUT VIC AB 2-0 CT1 TAPERPNT 27 (SUTURE) ×4 IMPLANT
SYR 30ML LL (SYRINGE) ×9 IMPLANT
SYR TB 1ML LUER SLIP (SYRINGE) ×3 IMPLANT
TOWEL GREEN STERILE (TOWEL DISPOSABLE) ×6 IMPLANT
TOWEL GREEN STERILE FF (TOWEL DISPOSABLE) ×6 IMPLANT
TRAY CATH 16FR W/PLASTIC CATH (SET/KITS/TRAYS/PACK) ×3 IMPLANT
WATER STERILE IRR 1000ML POUR (IV SOLUTION) IMPLANT

## 2019-06-30 NOTE — Progress Notes (Signed)
Orthopedic Tech Progress Note Patient Details:  [name] Bell 24-Oct-1933 [id number]  CPM Left Knee CPM Left Knee: On Left Knee Flexion (Degrees): 40 Left Knee Extension (Degrees): 10 Additional Comments: cpm applied and bone foam left in room.  Post Interventions Patient Tolerated: Well Instructions Provided: Care of device, Adjustment of device  Karolee Stamps 06/30/2019, 10:44 PM

## 2019-06-30 NOTE — Progress Notes (Signed)
Ortho tech to place CPM once pt gets into room. RN receiving pt made aware.

## 2019-06-30 NOTE — Anesthesia Procedure Notes (Signed)
Anesthesia Regional Block: Adductor canal block   Pre-Anesthetic Checklist: ,, timeout performed, Correct Patient, Correct Site, Correct Laterality, Correct Procedure, Correct Position, site marked, Risks and benefits discussed,  Surgical consent,  Pre-op evaluation,  At surgeon's request and post-op pain management  Laterality: Left and Lower  Prep: chloraprep       Needles:  Injection technique: Single-shot     Needle Length: 9cm  Needle Gauge: 22     Additional Needles: Arrow StimuQuik ECHO Echogenic Stimulating PNB Needle  Procedures:,,,, ultrasound used (permanent image in chart),,,,  Narrative:  Start time: 06/30/2019 5:26 PM End time: 06/30/2019 5:32 PM Injection made incrementally with aspirations every 5 mL.  Performed by: Personally  Anesthesiologist: Oleta Mouse, MD

## 2019-06-30 NOTE — Anesthesia Procedure Notes (Addendum)
Spinal  Patient location during procedure: OR Start time: 06/30/2019 6:03 PM End time: 06/30/2019 6:09 PM Staffing Anesthesiologist: Oleta Mouse, MD Performed: anesthesiologist  Preanesthetic Checklist Completed: patient identified, site marked, surgical consent, pre-op evaluation, timeout performed, IV checked, risks and benefits discussed and monitors and equipment checked Spinal Block Patient position: sitting Prep: ChloraPrep Patient monitoring: heart rate, cardiac monitor, continuous pulse ox and blood pressure Approach: midline Location: L4-5 Injection technique: single-shot Needle Needle type: Pencan  Needle gauge: 24 G Needle length: 9 cm Assessment Sensory level: T6

## 2019-06-30 NOTE — Progress Notes (Signed)
Spoke to [employer], informed him that patient surgery has been delayed due to an emergency surgical case. Robert verbalized understanding.  Patient also informed of the situation/delay.

## 2019-06-30 NOTE — Anesthesia Postprocedure Evaluation (Signed)
Anesthesia Post Note  Patient: [name] Bell  Procedure(s) Performed: LEFT TOTAL KNEE ARTHROPLASTY, CEMENTED (Left )     Patient location during evaluation: PACU Anesthesia Type: Spinal and Regional Level of consciousness: oriented and awake and alert Pain management: pain level controlled Vital Signs Assessment: post-procedure vital signs reviewed and stable Respiratory status: spontaneous breathing, respiratory function stable and patient connected to nasal cannula oxygen Cardiovascular status: blood pressure returned to baseline and stable Postop Assessment: no headache, no backache, no apparent nausea or vomiting and spinal receding Anesthetic complications: no    Last Vitals:  Vitals:   06/30/19 2124 06/30/19 2159  BP: 131/69 127/66  Pulse: (!) 56 (!) 54  Resp: 15 20  Temp: 36.4 C 36.4 C  SpO2: 100%     Last Pain:  Vitals:   06/30/19 2159  TempSrc: Oral  PainSc:                  Karyl Kinnier Prudie Guthridge

## 2019-06-30 NOTE — Brief Op Note (Signed)
   06/30/2019  8:45 PM  PATIENT:  [name] Bell  85 y.o. male  PRE-OPERATIVE DIAGNOSIS:  left knee osteoarthritis  POST-OPERATIVE DIAGNOSIS:  left knee osteoarthritis  PROCEDURE:  Procedure(s): LEFT TOTAL KNEE ARTHROPLASTY, CEMENTED  SURGEON:  Surgeon(s): Meredith Pel, MD  ASSISTANT: magnant pa  ANESTHESIA:   spinal  EBL: 100 ml    Total I/O In: 1000 [I.V.:1000] Out: 1000 [Urine:1000]  BLOOD ADMINISTERED: none  DRAINS: none   LOCAL MEDICATIONS USED:    SPECIMEN:  No Specimen  COUNTS:  YES  TOURNIQUET:   Total Tourniquet Time Documented: Thigh (Left) - 95 minutes Total: Thigh (Left) - 95 minutes   DICTATION: .Other Dictation: Dictation Number [id number]  PLAN OF CARE: Admit to inpatient   PATIENT DISPOSITION:  PACU - hemodynamically stable

## 2019-06-30 NOTE — Transfer of Care (Signed)
Immediate Anesthesia Transfer of Care Note  Patient: [name] Bell  Procedure(s) Performed: LEFT TOTAL KNEE ARTHROPLASTY, CEMENTED (Left )  Patient Location: PACU  Anesthesia Type:Spinal  Level of Consciousness: awake, alert  and oriented  Airway & Oxygen Therapy: Patient Spontanous Breathing  Post-op Assessment: Report given to RN, Post -op Vital signs reviewed and stable and Moving upper extremities  Post vital signs: Reviewed and stable  Last Vitals:  Vitals Value Taken Time  BP    Temp    Pulse 56 06/30/19 2054  Resp 17 06/30/19 2054  SpO2 99 % 06/30/19 2054  Vitals shown include unvalidated device data.  Last Pain:  Vitals:   06/30/19 1740  TempSrc:   PainSc: 0-No pain      Patients Stated Pain Goal: 2 ([id number])  Complications: No apparent anesthesia complications

## 2019-06-30 NOTE — H&P (Signed)
TOTAL KNEE ADMISSION H&P  Patient is being admitted for left total knee arthroplasty.  Subjective:  Chief Complaint:left knee pain.  HPI: LORNA STAHLBERG, 85 y.o. male, has a history of pain and functional disability in the left knee due to arthritis and has failed non-surgical conservative treatments for greater than 12 weeks to includeNSAID's and/or analgesics, corticosteriod injections, viscosupplementation injections, flexibility and strengthening excercises, use of assistive devices and activity modification.  Onset of symptoms was gradual, starting 9 years ago with gradually worsening course since that time. The patient noted no past surgery on the left knee(s).  Patient currently rates pain in the left knee(s) at 9 out of 10 with activity. Patient has night pain, worsening of pain with activity and weight bearing, pain that interferes with activities of daily living, pain with passive range of motion, crepitus and joint swelling.  Patient has evidence of subchondral sclerosis and joint space narrowing by imaging studies. This patient has had Good result with right total knee replacement. There is no active infection.  Patient Active Problem List   Diagnosis Date Noted  . Arthritis of right shoulder region 09/24/2016  . Arthritis of shoulder region, left 02/22/2015  . Type II or unspecified type diabetes mellitus without mention of complication, not stated as uncontrolled 02/11/2012  . Hypogonadism male 02/11/2012  . Arthritis of knee, right 10/30/2011   Past Medical History:  Diagnosis Date  . Allergy    RHINITIS  . Ambulates with cane   . Anxiety   . Arthritis    osteoarthritis  . Asthmatic bronchitis    seasonal  . Cancer ([place])    skin cancer removed from back  . Depression   . Diabetes mellitus   . FHx: cardiovascular disease   . History of kidney stones   . History of pneumonia    reports d/t bronchitis  . Hx of colonic polyps   . Hypogonadism male   . 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 Pneumonia 2019  . PONV (postoperative nausea and vomiting)    reports has a difficult time coming out of ( knee surgery)  . Seborrheic keratosis     Past Surgical History:  Procedure Laterality Date  . CHOLECYSTECTOMY  2010   GALL BLADDER  . HERNIA REPAIR     during cholecystectomy  . KNEE ARTHROPLASTY  10/30/2011   Procedure: COMPUTER ASSISTED TOTAL KNEE ARTHROPLASTY;  Surgeon: Meredith Pel, MD;  Location: [facility];  Service: Orthopedics;  Laterality: Right;  right total knee arthroplasty  . KNEE CARTILAGE SURGERY  2009   Right knee  . PARTIAL GLOSSECTOMY  2017   at [facility]  . SHOULDER ARTHROSCOPY Right   . TOTAL SHOULDER ARTHROPLASTY Left 02/22/2015  . TOTAL SHOULDER ARTHROPLASTY Left 02/22/2015   Procedure: LEFT TOTAL SHOULDER ARTHROPLASTY;  Surgeon: Meredith Pel, MD;  Location: [facility];  Service: Orthopedics;  Laterality: Left;    Current Facility-Administered Medications  Medication Dose Route Frequency Provider Last Rate Last Dose  . ceFAZolin (ANCEF) 2-4 GM/100ML-% IVPB           . [START ON 07/01/2019] ceFAZolin (ANCEF) IVPB 2g/100 mL premix  2 g Intravenous On Call to OR Magnant, Charles L, PA-C      . chlorhexidine (HIBICLENS) 4 % liquid 4 application  60 mL Topical Once Magnant, Charles L, PA-C      . chlorhexidine (HIBICLENS) 4 % liquid 4 application  60 mL Topical Once Magnant, Charles L, PA-C      .  fentaNYL (SUBLIMAZE) 100 MCG/2ML injection           . lactated ringers infusion   Intravenous Continuous Barnet Glasgow, MD 10 mL/hr at 06/30/19 1424    . midazolam (VERSED) 2 MG/2ML injection           . povidone-iodine 10 % swab 2 application  2 application Topical Once Magnant, Charles L, PA-C      . tranexamic acid (CYKLOKAPRON) 1000MG /155mL IVPB           . tranexamic acid (CYKLOKAPRON) IVPB 1,000 mg  1,000 mg Intravenous To OR Magnant, Charles L, PA-C       Allergies  Allergen Reactions  . Thorazine [Chlorpromazine] Other (See Comments)     Hallucinations. Patient does not want to take anymore.  . Sulfa Antibiotics Hives    Social History   Tobacco Use  . Smoking status: Never Smoker  . Smokeless tobacco: Never Used  Substance Use Topics  . Alcohol use: Yes    Alcohol/week: 3.0 standard drinks    Types: 3 Glasses of wine per week    Comment: occasional    History reviewed. No pertinent family history.   ROS  Objective:  Physical Exam  Vital signs in last 24 hours: Temp:  [98 F (36.7 C)] 98 F (36.7 C) (09/22 1403) Pulse Rate:  [66-68] 68 (09/22 1445) Resp:  [16-20] 16 (09/22 1445) BP: (136-141)/(78-83) 136/83 (09/22 1445) SpO2:  [98 %-99 %] 99 % (09/22 1445) Weight:  [81.2 kg] 81.2 kg (09/22 1434)  Labs:   Estimated body mass index is 30.73 kg/m as calculated from the following:   Height as of this encounter: 5\' 4"  (1.626 m).   Weight as of this encounter: 81.2 kg.   Imaging Review Plain radiographs demonstrate severe degenerative joint disease of the left knee(s). The overall alignment ismild varus. The bone quality appears to be fair for age and reported activity level.      Assessment/Plan:  End stage arthritis, left knee   The patient history, physical examination, clinical judgment of the provider and imaging studies are consistent with end stage degenerative joint disease of the left knee(s) and total knee arthroplasty is deemed medically necessary. The treatment options including medical management, injection therapy arthroscopy and arthroplasty were discussed at length. The risks and benefits of total knee arthroplasty were presented and reviewed. The risks due to aseptic loosening, infection, stiffness, patella tracking problems, thromboembolic complications and other imponderables were discussed. The patient acknowledged the explanation, agreed to proceed with the plan and consent was signed. Patient is being admitted for inpatient treatment for surgery, pain control, PT, OT, prophylactic  antibiotics, VTE prophylaxis, progressive ambulation and ADL's and discharge planning. The patient is planning to be discharged to skilled nursing facility    Anticipated LOS equal to or greater than 2 midnights due to - Age 46 and older with one or more of the following:  - Obesity  - Expected need for hospital services (PT, OT, Nursing) required for safe  discharge  - Anticipated need for postoperative skilled nursing care or inpatient rehab  - Active co-morbidities: None OR   - Unanticipated findings during/Post Surgery: None  - Patient is a high risk of re-admission due to: None

## 2019-06-30 NOTE — Progress Notes (Addendum)
Patient went back to OR with CRNA.  Partner Herbie Baltimore informed that patient is now in the OR.

## 2019-06-30 NOTE — Op Note (Signed)
NAME: KAMON, PIZZOLATO MEDICAL RECORD [id number] ACCOUNT [id number] DATE OF BIRTH:[birth year] FACILITY: [facility] LOCATION: [facility] PHYSICIAN:Espn Zeman Diamantina Providence, MD  OPERATIVE REPORT  DATE OF PROCEDURE:  06/30/2019  PREOPERATIVE DIAGNOSIS:  Left knee arthritis.  POSTOPERATIVE DIAGNOSIS:  Left knee arthritis.  PROCEDURE:  Left total knee replacement using Stryker Triathlon knee, size 3 press-fit femur cruciate retaining, size 4 tibial plateau press fit, deep dish poly 9 mm, and 32 mm 3-peg press-fit patella.  SURGEON:  Cammy Copa, MD  ASSISTANT:  Karenann Cai, PA-C   INDICATIONS:  The patient is an 85 year old patient with end-stage left knee arthritis who presents for operative management after explanation of risks and benefits.  PROCEDURE IN DETAIL:  The patient was brought to the operating room where spinal anesthetic was induced.  Preoperative antibiotics were administered.  Timeout was called.  Left leg prescrubbed with alcohol and Betadine, allowed to air dry, prepped with  DuraPrep solution and draped in a sterile manner.  [name] was used to cover the operative field.  Leg was elevated, exsanguinated with the Esmarch wrap.  Total tourniquet time 95 minutes at 300 mmHg.  Anterior approach to the knee was made.  Skin and  subcutaneous tissue were sharply divided.  Median parapatellar approach was made and marked with #1 Vicryl suture.  Soft tissue removed from the anterior distal femur.  Lateral patellofemoral ligament was released.  Fat pad partially excised.  Mild soft  tissue dissection was performed medially due to the patient's preoperative varus deformity.  The patella was everted.  Anterior horn of the lateral meniscus was released.  ACL was released.  Using intramedullary alignment, the tibia was cut initially 9  mm off the least-affected lateral tibial plateau, and that was later revised 2 more millimeters inferiorly to get under the medial defect and then 2 more millimeters  in order to achieve full extension.  10 mm was cut off the femur.  After those cuts were  made, the patient did achieve full extension with a 9 mm spacer block.  Femur was sized to a size 3.  Anterior, posterior, and chamfer cuts were made.  The tibia was keel punched.  Trial components were placed.  The patella was cut down from 22 to 12  and a 32 mm 3-peg patella placed.  With trial components in position, the leg alignment was out of varus and into neutral alignment.  The patient had full extension, full flexion with no liftoff, excellent patellar tracking using no-thumbs technique.  At  this time, thorough irrigation was performed after removing the trial components.  Exparel was injected into the capsular region.  Tranexamic acid sponge was allowed to sit for 3 minutes.  IrriSept was used throughout the entire procedure for infection  control.  The components were then press fit into good position and alignment.  Excellent press fit was obtained because this patient's bone was of the highest caliber.  Following placing the implants, the tourniquet was released.  Bleeding points  encountered were controlled using electrocautery.  Thorough irrigation was again performed.  Vancomycin powder was placed within the incision.  Arthrotomy was closed over a bolster using #1 Vicryl suture followed by interrupted inverted 0 Vicryl suture,  2-0 Vicryl suture, and a 3-0 Monocryl.  Steri-Strips and an Aquacel dressing were applied.  A solution of Marcaine, morphine, clonidine injected into the knee for postop pain relief.  The patient tolerated the procedure well without immediate  complications, transferred to the recovery room in stable condition.  Luke's assistance was required at all times for retraction, opening and closing.  His assistance was a medical necessity.  LN/NUANCE  D:06/30/2019 T:06/30/2019 JOB:[id number]

## 2019-07-01 ENCOUNTER — Encounter (HOSPITAL_COMMUNITY): Payer: Self-pay | Admitting: Orthopedic Surgery

## 2019-07-01 ENCOUNTER — Other Ambulatory Visit: Payer: Self-pay

## 2019-07-01 DIAGNOSIS — M1712 Unilateral primary osteoarthritis, left knee: Secondary | ICD-10-CM | POA: Diagnosis not present

## 2019-07-01 LAB — GLUCOSE, CAPILLARY
Glucose-Capillary: 342 mg/dL — ABNORMAL HIGH (ref 70–99)
Glucose-Capillary: 378 mg/dL — ABNORMAL HIGH (ref 70–99)
Glucose-Capillary: 318 mg/dL — ABNORMAL HIGH (ref 70–99)
Glucose-Capillary: 362 mg/dL — ABNORMAL HIGH (ref 70–99)
Glucose-Capillary: 248 mg/dL — ABNORMAL HIGH (ref 70–99)

## 2019-07-01 MED ORDER — INSULIN ASPART 100 UNIT/ML ~~LOC~~ SOLN
0.0000 [IU] | Freq: Three times a day (TID) | SUBCUTANEOUS | Status: DC
  Administered 2019-07-01: 19:00:00 15 [IU] via SUBCUTANEOUS
  Administered 2019-07-01: 13:00:00 11 [IU] via SUBCUTANEOUS
  Administered 2019-07-02: 3 [IU] via SUBCUTANEOUS
  Administered 2019-07-02: 8 [IU] via SUBCUTANEOUS

## 2019-07-01 MED ORDER — INSULIN NPH (HUMAN) (ISOPHANE) 100 UNIT/ML ~~LOC~~ SUSP
15.0000 [IU] | Freq: Two times a day (BID) | SUBCUTANEOUS | Status: DC
  Administered 2019-07-01 – 2019-07-02 (×2): 15 [IU] via SUBCUTANEOUS
  Filled 2019-07-01: qty 10

## 2019-07-01 MED ORDER — INSULIN ASPART 100 UNIT/ML ~~LOC~~ SOLN
0.0000 [IU] | Freq: Every day | SUBCUTANEOUS | Status: DC
  Administered 2019-07-01: 2 [IU] via SUBCUTANEOUS

## 2019-07-01 NOTE — Plan of Care (Signed)
?  Problem: Education: ?Goal: Knowledge of the prescribed therapeutic regimen will improve ?Outcome: Progressing ?  ?Problem: Activity: ?Goal: Ability to avoid complications of mobility impairment will improve ?Outcome: Progressing ?  ?Problem: Pain Management: ?Goal: Pain level will decrease with appropriate interventions ?Outcome: Progressing ?  ?Problem: Skin Integrity: ?Goal: Will show signs of wound healing ?Outcome: Progressing ?  ?

## 2019-07-01 NOTE — Care Management Obs Status (Signed)
[place] NOTIFICATION   Patient Details  Name: [name] Bell MRN: [id number] Date of Birth: Jun 19, 1934   Medicare Observation Status Notification Given:  Yes    Midge Minium RN, BSN, NCM-BC, ACM-RN [phone] 07/01/2019, 4:18 PM

## 2019-07-01 NOTE — Progress Notes (Signed)
Physical Therapy Treatment Patient Details Name: [name] Bell MRN: [id number] DOB: [birth year] Today's Date: 07/01/2019    History of Present Illness 85 y.o. male admitted on 06/30/19 for elective L TKA, WBAT post op.  Pt with significant PMH of oral cavity carcinoma, DM, asthmatic bronchitis, anxiety, L TSA, R shoulder arthroscopy, R TKA.      PT Comments    Pt remains mildly confused, but generally oriented.  He was able to participate fully in our therapy walking much further than this AM and preforming HEP program again.  Pt placed back in bed and in CPM at the end of the session.  Plan is to d/c home tomorrow after AM therapy session.    Follow Up Recommendations  Follow surgeon's recommendation for DC plan and follow-up therapies;Supervision for mobility/OOB     Equipment Recommendations  None recommended by PT    Recommendations for Other Services  NA     Precautions / Restrictions Precautions Precautions: Knee Precaution Booklet Issued: Yes (comment) Precaution Comments: knee exercise handout given, precautions reviewed.  Restrictions LLE Weight Bearing: Weight bearing as tolerated    Mobility  Bed Mobility Overal bed mobility: Needs Assistance Bed Mobility: Sit to Supine       Sit to supine: Modified independent (Device/Increase time)   General bed mobility comments: Pt learned the scoop your surgical leg up with your non surgical leg and was able to demonstrate this technique safely.   Transfers Overall transfer level: Needs assistance Equipment used: Rolling walker (2 wheeled) Transfers: Sit to/from Stand Sit to Stand: Supervision         General transfer comment: supervision for safety, cues to reinforce safe hand placement.   Ambulation/Gait Ambulation/Gait assistance: Min guard Gait Distance (Feet): 260 Feet Assistive device: Rolling walker (2 wheeled) Gait Pattern/deviations: Step-through pattern;Antalgic;Trunk flexed Gait velocity:  decreased Gait velocity interpretation: 1.31 - 2.62 ft/sec, indicative of limited community ambulator General Gait Details: Verbal cues to stay closer to RW and for upright posture.            Balance Overall balance assessment: Needs assistance Sitting-balance support: Feet supported;No upper extremity supported Sitting balance-Leahy Scale: Good     Standing balance support: Bilateral upper extremity supported;No upper extremity supported;Single extremity supported Standing balance-Leahy Scale: Fair                              Cognition Arousal/Alertness: Awake/alert Behavior During Therapy: WFL for tasks assessed/performed Overall Cognitive Status: Impaired/Different from baseline Area of Impairment: Awareness;Memory;Problem solving                     Memory: Decreased short-term memory     Awareness: Emergent Problem Solving: Difficulty sequencing General Comments: Pt continues to repeat himself, be mildly confused.  Could be medication effect, he also seems to not hear me sometimes.       Exercises Total Joint Exercises Ankle Circles/Pumps: AROM;Both;10 reps;Supine Quad Sets: AROM;Left;10 reps Towel Squeeze: AROM;Both;10 reps Heel Slides: AAROM;Left;10 reps        Pertinent Vitals/Pain Pain Assessment: Faces Faces Pain Scale: Hurts whole lot Pain Location: left knee Pain Descriptors / Indicators: Grimacing;Guarding Pain Intervention(s): Monitored during session;Limited activity within patient's tolerance;Repositioned;RN gave pain meds during session           PT Goals (current goals can now be found in the care plan section) Acute Rehab PT Goals Patient Stated Goal: to get back to an active  lifestyle Progress towards PT goals: Progressing toward goals    Frequency    7X/week      PT Plan Current plan remains appropriate       AM-PAC PT "6 Clicks" Mobility   Outcome Measure  Help needed turning from your back to your  side while in a flat bed without using bedrails?: None Help needed moving from lying on your back to sitting on the side of a flat bed without using bedrails?: None Help needed moving to and from a bed to a chair (including a wheelchair)?: None Help needed standing up from a chair using your arms (e.g., wheelchair or bedside chair)?: None Help needed to walk in hospital room?: A Little Help needed climbing 3-5 steps with a railing? : A Little 6 Click Score: 22    End of Session Equipment Utilized During Treatment: Gait belt Activity Tolerance: Patient limited by pain Patient left: in bed;with call bell/phone within reach;with family/visitor present   PT Visit Diagnosis: Muscle weakness (generalized) (M62.81);Difficulty in walking, not elsewhere classified (R26.2);Pain Pain - Right/Left: Left Pain - part of body: Knee     Time: 1601-0932 PT Time Calculation (min) (ACUTE ONLY): 40 min  Charges:  $Gait Training: 23-37 mins $Therapeutic Exercise: 8-22 mins      Pegah Segel B. Veyda Kaufman, PT, DPT  Acute Rehabilitation [phone] pager [phone] office  @ [facility]: [phone]         07/01/2019, 6:41 PM

## 2019-07-01 NOTE — Care Management (Signed)
CM consult acknowledged to assist with any HH/DME needs. Awaiting PT eval for DCP recommendations and will continue to follow.  Midge Minium RN, BSN, NCM-BC, ACM-RN [phone]

## 2019-07-01 NOTE — Evaluation (Signed)
Physical Therapy Evaluation Patient Details Name: [name] Bell MRN: [id number] DOB: February 03, 1934 Today's Date: 07/01/2019   History of Present Illness  85 y.o. male admitted on 06/30/19 for elective L TKA, WBAT post op.  Pt with significant PMH of oral cavity carcinoma, DM, asthmatic bronchitis, anxiety, L TSA, R shoulder arthroscopy, R TKA.    Clinical Impression  Pt is POD #1 and is mobilizing well, min guard assist with RW hallway distances.  His husband, Molly Maduro was present for the entire session, for education and stair training.  HEP given and exercises reviewed.  Pt has some mild cognitive deficits that are not his normal per his husband's report. PT will continue to follow acutely for safe mobility progression.   PT to follow acutely for deficits listed below.      Follow Up Recommendations Follow surgeon's recommendation for DC plan and follow-up therapies;Supervision for mobility/OOB    Equipment Recommendations  None recommended by PT    Recommendations for Other Services   NA    Precautions / Restrictions Precautions Precautions: Knee Precaution Booklet Issued: Yes (comment) Precaution Comments: knee exercise handout given, precautions reviewed.  Restrictions Weight Bearing Restrictions: Yes LLE Weight Bearing: Weight bearing as tolerated      Mobility  Bed Mobility Overal bed mobility: Modified Independent                Transfers Overall transfer level: Needs assistance Equipment used: Rolling walker (2 wheeled) Transfers: Sit to/from Stand Sit to Stand: Min guard         General transfer comment: min guard assist for safety during transitions.   Ambulation/Gait Ambulation/Gait assistance: Min guard Gait Distance (Feet): 150 Feet Assistive device: Rolling walker (2 wheeled) Gait Pattern/deviations: Step-through pattern;Antalgic;Trunk flexed Gait velocity: decreased Gait velocity interpretation: 1.31 - 2.62 ft/sec, indicative of limited community  ambulator General Gait Details: Verbal cues for correct LE sequencing, safe RW use, upright posture.   Stairs Stairs: Yes Stairs assistance: Min assist Stair Management: One rail Right;Step to pattern;Forwards Number of Stairs: 2 General stair comments: Pt, with partner's assistance, was able to ascend and descend two stairs simulating home entry.  I would like to do this again with a cane on the R instead of the rail and Robert on the left.           Balance Overall balance assessment: Needs assistance Sitting-balance support: Feet supported;No upper extremity supported Sitting balance-Leahy Scale: Good     Standing balance support: Bilateral upper extremity supported Standing balance-Leahy Scale: Poor                               Pertinent Vitals/Pain Pain Assessment: Faces Faces Pain Scale: Hurts even more Pain Location: left knee Pain Descriptors / Indicators: Grimacing;Guarding Pain Intervention(s): Limited activity within patient's tolerance;Monitored during session;Repositioned    Home Living Family/patient expects to be discharged to:: Private residence Living Arrangements: Spouse/significant other Available Help at Discharge: Family;Friend(s);Available 24 hours/day Type of Home: House Home Access: Stairs to enter Entrance Stairs-Rails: None Entrance Stairs-Number of Steps: 2 Home Layout: One level;Laundry or work area in [employer] Equipment: Environmental consultant - 2 wheels;Cane - single point Additional Comments: retired professor at [employer]    Prior Function Level of Independence: Independent with assistive device(s)         Comments: used cane PTA     Hand Dominance   Dominant Hand: Right    Extremity/Trunk Assessment   Upper Extremity  Assessment Upper Extremity Assessment: Defer to OT evaluation(h/o R shoulder surgery and L TSA)    Lower Extremity Assessment Lower Extremity Assessment: LLE deficits/detail LLE Deficits / Details: left leg  with normal post op pain and weakness.  Ankle 4/5, knee 3-/5, hip flexion 3-/5 LLE Sensation: WNL    Cervical / Trunk Assessment Cervical / Trunk Assessment: Normal  Communication   Communication: HOH  Cognition Arousal/Alertness: Awake/alert Behavior During Therapy: WFL for tasks assessed/performed Overall Cognitive Status: Impaired/Different from baseline Area of Impairment: Awareness;Memory;Problem solving                     Memory: Decreased short-term memory     Awareness: Emergent Problem Solving: Difficulty sequencing General Comments: Pt a bit "off" per his partner Molly Maduro, he often loses his train of thought, is tangental (more than usual) and is talking about driving with the home therapist.  Molly Maduro and Dr. August Saucer in room to experience this, so Dr. August Saucer to keep pt another night.          Exercises Total Joint Exercises Ankle Circles/Pumps: AROM;Both;10 reps;Supine Quad Sets: AROM;Left;10 reps Towel Squeeze: AROM;Both;10 reps Short Arc Quad: AROM;Left;10 reps Heel Slides: AAROM;Left;10 reps Hip ABduction/ADduction: AROM;Left;10 reps Straight Leg Raises: AROM;Left;10 reps Long Arc Quad: AROM;Left;10 reps Knee Flexion: AROM;AAROM;Left;20 reps Goniometric ROM: 10-92   Assessment/Plan    PT Assessment Patient needs continued PT services  PT Problem List Decreased strength;Decreased range of motion;Decreased activity tolerance;Decreased balance;Decreased mobility;Decreased coordination;Decreased cognition;Decreased knowledge of use of DME;Decreased safety awareness;Decreased knowledge of precautions;Pain       PT Treatment Interventions DME instruction;Gait training;Stair training;Functional mobility training;Therapeutic activities;Therapeutic exercise;Balance training;Patient/family education;Cognitive remediation;Modalities;Manual techniques    PT Goals (Current goals can be found in the Care Plan section)  Acute Rehab PT Goals Patient Stated Goal: to get  back to an active lifestyle PT Goal Formulation: With patient/family Time For Goal Achievement: 07/08/19 Potential to Achieve Goals: Good    Frequency 7X/week           AM-PAC PT "6 Clicks" Mobility  Outcome Measure Help needed turning from your back to your side while in a flat bed without using bedrails?: None Help needed moving from lying on your back to sitting on the side of a flat bed without using bedrails?: None Help needed moving to and from a bed to a chair (including a wheelchair)?: None Help needed standing up from a chair using your arms (e.g., wheelchair or bedside chair)?: A Little Help needed to walk in hospital room?: A Little Help needed climbing 3-5 steps with a railing? : A Little 6 Click Score: 21    End of Session Equipment Utilized During Treatment: Gait belt Activity Tolerance: Patient limited by pain Patient left: in chair;with call bell/phone within reach;with family/visitor present   PT Visit Diagnosis: Muscle weakness (generalized) (M62.81);Difficulty in walking, not elsewhere classified (R26.2);Pain Pain - Right/Left: Left Pain - part of body: Knee    Time: 3295-1884 PT Time Calculation (min) (ACUTE ONLY): 59 min   Charges:          Lurena Joiner B. Makinzy Cleere, PT, DPT  Acute Rehabilitation [phone] pager #[phone] office  @ [facility]: [phone]   PT Evaluation $PT Eval Moderate Complexity: 1 Mod PT Treatments $Gait Training: 8-22 mins $Therapeutic Exercise: 23-37 mins        07/01/2019, 1:07 PM

## 2019-07-01 NOTE — Progress Notes (Signed)
Patient stable Has done hall walking and stairs and is progressing well in therapy Plan is to discharge home tomorrow.  He has good family support network.  He will need home health physical therapy.  Follow-up with me in about 10 days

## 2019-07-01 NOTE — Addendum Note (Signed)
Addendum  created 07/01/19 0022 by Claris Che, CRNA   Intraprocedure Flowsheets edited

## 2019-07-01 NOTE — Progress Notes (Signed)
Patient states that he is a Diabetic and checks his sugar at home. CBG 342, MD notified and order received to star patient on a novolog sliding scale. Patient notified. Cont to monitor.

## 2019-07-01 NOTE — Progress Notes (Signed)
  Subjective: ANTOHNY Bell is a 85 y.o. male s/p left TKA.  They are POD1.  Pt's pain is controlled.  Pt has not ambulated yet.     Objective: Vital signs in last 24 hours: Temp:  [97.5 F (36.4 C)-98 F (36.7 C)] 97.5 F (36.4 C) (09/23 0432) Pulse Rate:  [54-68] 59 (09/23 0432) Resp:  [12-20] 15 (09/23 0432) BP: (116-176)/(60-99) 138/81 (09/23 0432) SpO2:  [98 %-100 %] 100 % (09/23 0432) Weight:  [81.2 kg] 81.2 kg (09/22 1434)  Intake/Output from previous day: 09/22 0701 - 09/23 0700 In: 1787.1 [I.V.:1523.1; IV Piggyback:263.9] Out: [id number] [Urine:1550; Blood:5] Intake/Output this shift: No intake/output data recorded.  Exam:  No gross blood or drainage overlying the dressing 2+ DP pulse Sensation intact distally in the left foot Able to dorsiflex and plantarflex the left foot   Labs: No results for input(s): HGB in the last 72 hours. No results for input(s): WBC, RBC, HCT, PLT in the last 72 hours. No results for input(s): NA, K, CL, CO2, BUN, CREATININE, GLUCOSE, CALCIUM in the last 72 hours. No results for input(s): LABPT, INR in the last 72 hours.  Assessment/Plan: Pt is POD1 s/p left TKA.    -Plan to discharge to home today or tomorrow pending patient's pain and PT eval  -WBAT with a walker  -Okay to shower, dressing is waterproof.  Cautioned patient against soaking dressing in bath/pool/body of water  -Encouraged the use of the blue cradle boot to work on extension.  Cautioned patient against using a pillow under their knee.  -Use the CPM machine at least 3 times per day for one hour each time, increasing the degrees daily.     Palmer Fahrner L Shakoya Gilmore 07/01/2019, 7:17 AM

## 2019-07-01 NOTE — Progress Notes (Signed)
PA notified of diabetic coordinators recommendation of 15units of NPH BID.
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
## 2019-07-01 NOTE — Progress Notes (Signed)
Inpatient Diabetes Program Recommendations  AACE/ADA: New Consensus Statement on Inpatient Glycemic Control (2015)  Target Ranges:  Prepandial:   less than 140 mg/dL      Peak postprandial:   less than 180 mg/dL (1-2 hours)      Critically ill patients:  140 - 180 mg/dL   Lab Results  Component Value Date   GLUCAP 318 (H) 07/01/2019   HGBA1C 8.4 (H) 06/24/2019    Review of Glycemic Control  Diabetes history: DM2 Outpatient Diabetes medications: Humulin N 30 units bid, Victoza 1.8 mg QD, metformin 500 mg bid Current orders for Inpatient glycemic control: Novolog 0-15 units tidwc and 0-5 units QHS  HgbA1C - 8.4% Needs part of home basal insulin.  Inpatient Diabetes Program Recommendations:     NPH 15 units bid.  Will follow.  Thank you. Lorenda Peck, RD, LDN, CDE Inpatient Diabetes Coordinator [phone]

## 2019-07-02 DIAGNOSIS — M1712 Unilateral primary osteoarthritis, left knee: Secondary | ICD-10-CM | POA: Diagnosis not present

## 2019-07-02 LAB — GLUCOSE, CAPILLARY
Glucose-Capillary: 196 mg/dL — ABNORMAL HIGH (ref 70–99)
Glucose-Capillary: 278 mg/dL — ABNORMAL HIGH (ref 70–99)

## 2019-07-02 MED ORDER — TIZANIDINE HCL 2 MG PO TABS: 2.0000 mg | ORAL_TABLET | Freq: Three times a day (TID) | ORAL | 0 refills | Status: DC | PRN

## 2019-07-02 MED ORDER — OXYCODONE HCL 5 MG PO TABS: 5.0000 mg | ORAL_TABLET | ORAL | 0 refills | Status: DC | PRN

## 2019-07-02 MED ORDER — ASPIRIN 81 MG PO CHEW: 81.0000 mg | CHEWABLE_TABLET | Freq: Two times a day (BID) | ORAL | 0 refills | Status: AC

## 2019-07-02 MED ORDER — ASPIRIN 81 MG PO CHEW: 81.0000 mg | CHEWABLE_TABLET | Freq: Every day | ORAL | 0 refills | Status: DC

## 2019-07-02 NOTE — TOC Transition Note (Signed)
Transition of Care [facility]) - CM/SW Discharge Note   Patient Details  Name: NASR DOOMS MRN: [id number] Date of Birth: 1934/04/28  [facility] of [facility] CM/SW Contact:  Georgeanna Lea, RN Phone Number: 07/02/2019, 10:22 AM   Clinical Narrative:    CM spoke to patient to discuss CM role and POC needs. Patient states living at home with his partner and being independent with AD PTA. PCP/Demo verified. Patient is s/p elective L TKA. PT eval completed with no DME recommended; MD placed HHPT orders. CMS HH Compare list provided with [facility] selected. HH referral given to Adela Lank RN [facility] liaison); AVS updated. Patient indicated that his partner and close friends will all be available to assist with any post-discharge needs. No further needs from CM.    Final next level of care: [place] Barriers to Discharge: No Barriers Identified   Patient Goals and CMS Choice Patient states their goals for this hospitalization and ongoing recovery are:: "to recover and get well" CMS Medicare.gov Compare Post Acute Care list provided to:: Patient Choice offered to / list presented to : Patient   Discharge Plan and Services          DME Arranged: N/A DME Agency: NA       HH Arranged: PT HH Agency: [place] Date [place]: 07/02/19 Time [place]: 1022 Representative spoke with at [place]: Adela Lank RN (liaison)  Social Determinants of Health (SDOH) Interventions     Readmission Risk Interventions No flowsheet data found.

## 2019-07-02 NOTE — Plan of Care (Signed)
  Problem: Education: Goal: Knowledge of the prescribed therapeutic regimen will improve Outcome: Progressing   Problem: Activity: Goal: Ability to avoid complications of mobility impairment will improve Outcome: Progressing   Problem: Pain Management: Goal: Pain level will decrease with appropriate interventions Outcome: Progressing   

## 2019-07-02 NOTE — Progress Notes (Signed)
Physical Therapy Treatment Patient Details Name: [name] Bell MRN: [id number] DOB: [birth year] Today's Date: 07/02/2019    History of Present Illness 85 y.o. male admitted on 06/30/19 for elective L TKA, WBAT post op.  Pt with significant PMH of oral cavity carcinoma, DM, asthmatic bronchitis, anxiety, L TSA, R shoulder arthroscopy, R TKA.      PT Comments    Pt is slow and guarded but functioning quite well.  He is concerned that he has not had a bowel movement.  Pt performed LE strengthening and retrial of stair training with RW for safe entry into his home.  Pt is ready to d/c home from a mobility standpoint.  He has all DME at home.    Follow Up Recommendations  Follow surgeon's recommendation for DC plan and follow-up therapies;Supervision for mobility/OOB     Equipment Recommendations  None recommended by PT    Recommendations for Other Services       Precautions / Restrictions Precautions Precautions: Knee Precaution Booklet Issued: Yes (comment) Precaution Comments: knee exercise handout given, precautions reviewed.  Restrictions Weight Bearing Restrictions: Yes LLE Weight Bearing: Weight bearing as tolerated    Mobility  Bed Mobility Overal bed mobility: Needs Assistance Bed Mobility: Supine to Sit     Supine to sit: Min assist     General bed mobility comments: Min assistance to hook LLE with support of R to move to edge of bed.  Transfers Overall transfer level: Needs assistance Equipment used: Rolling walker (2 wheeled) Transfers: Sit to/from Stand Sit to Stand: Supervision         General transfer comment: Repeated cueing for hand placement.  Ambulation/Gait Ambulation/Gait assistance: Supervision Gait Distance (Feet): 150 Feet Assistive device: Rolling walker (2 wheeled) Gait Pattern/deviations: Step-through pattern;Antalgic;Trunk flexed     General Gait Details: Cues for upper trunk control.   Stairs Stairs: Yes Stairs assistance: Min  assist Stair Management: Backwards;With walker Number of Stairs: 2 General stair comments: Cues for sequencing and hand placement and RW placement.  Educated on this technique for entry into front door.   Wheelchair Mobility    Modified Rankin (Stroke Patients Only)       Balance Overall balance assessment: Needs assistance Sitting-balance support: Feet supported;No upper extremity supported Sitting balance-Leahy Scale: Good       Standing balance-Leahy Scale: Fair                              Cognition Arousal/Alertness: Awake/alert Behavior During Therapy: WFL for tasks assessed/performed Overall Cognitive Status: Within Functional Limits for tasks assessed Area of Impairment: Awareness                               General Comments: "I was scared yesterday, I thought I had a mini stroke."      Exercises Total Joint Exercises Ankle Circles/Pumps: AROM;Both;20 reps;Supine Quad Sets: AROM;Left;10 reps;Supine Heel Slides: Left;10 reps;Supine;AAROM Hip ABduction/ADduction: AROM;Left;10 reps;Supine Straight Leg Raises: Left;10 reps;Supine;AAROM    General Comments        Pertinent Vitals/Pain Pain Assessment: 0-10 Pain Score: 4  Pain Location: left knee Pain Descriptors / Indicators: Grimacing;Guarding Pain Intervention(s): Monitored during session;Limited activity within patient's tolerance;Ice applied    Home Living                      Prior Function  PT Goals (current goals can now be found in the care plan section) Acute Rehab PT Goals Patient Stated Goal: to get back to an active lifestyle Potential to Achieve Goals: Good    Frequency    7X/week      PT Plan Current plan remains appropriate    Co-evaluation              AM-PAC PT "6 Clicks" Mobility   Outcome Measure  Help needed turning from your back to your side while in a flat bed without using bedrails?: None Help needed moving  from lying on your back to sitting on the side of a flat bed without using bedrails?: None Help needed moving to and from a bed to a chair (including a wheelchair)?: None Help needed standing up from a chair using your arms (e.g., wheelchair or bedside chair)?: None Help needed to walk in hospital room?: A Little Help needed climbing 3-5 steps with a railing? : A Little 6 Click Score: 22    End of Session Equipment Utilized During Treatment: Gait belt Activity Tolerance: Patient limited by pain Patient left: in bed;with call bell/phone within reach;with family/visitor present Nurse Communication: Mobility status PT Visit Diagnosis: Muscle weakness (generalized) (M62.81);Difficulty in walking, not elsewhere classified (R26.2);Pain Pain - Right/Left: Left Pain - part of body: Knee     Time: 4332-9518 PT Time Calculation (min) (ACUTE ONLY): 30 min  Charges:  $Gait Training: 8-22 mins $Therapeutic Exercise: 8-22 mins                     Joycelyn Rua, PTA Acute Rehabilitation Services Pager [phone] Office [phone]     Clementina Mareno Artis Delay 07/02/2019, 11:47 AM

## 2019-07-02 NOTE — Progress Notes (Signed)
Patient discharging home today. Discharge instructions explained to patient and he verbalized understanding. No more episodes of confusion noted. Took all personal belongings. No further questions or concerns voiced.

## 2019-07-02 NOTE — Progress Notes (Signed)
  Subjective: [name] Bell is a 85 y.o. male s/p left TKA.  They are POD2.  Pt's pain is controlled. Pt has ambulated with little difficulty.  He had one episode of confusion last night where he thought he was in his kitchen at his house.  This resolved spontaneously after patient fell asleep and awoke later.  He denies any disorientation today.  Denies any focal weakness or facial drooping.  No numbness or tingling or weakness.    Objective: Vital signs in last 24 hours: Temp:  [97.7 F (36.5 C)-97.9 F (36.6 C)] 97.9 F (36.6 C) (09/24 0912) Pulse Rate:  [69-71] 71 (09/24 0912) Resp:  [16-17] 16 (09/24 0912) BP: (110-132)/(58-79) 132/79 (09/24 0912) SpO2:  [94 %-100 %] 94 % (09/24 0912)  Intake/Output from previous day: 09/23 0701 - 09/24 0700 In: 600 [P.O.:600] Out: -  Intake/Output this shift: No intake/output data recorded.  Exam:  No gross blood or drainage overlying the dressing 2+ DP pulse Sensation intact distally in the left foot Able to dorsiflex and plantarflex the left foot 5/5 motor strength of bilateral grip strength, wrist extension, wrist flexion, bicep, tricep, deltoid No facial droop Oriented to situation, person, place, and time  Labs: No results for input(s): HGB in the last 72 hours. No results for input(s): WBC, RBC, HCT, PLT in the last 72 hours. No results for input(s): NA, K, CL, CO2, BUN, CREATININE, GLUCOSE, CALCIUM in the last 72 hours. No results for input(s): LABPT, INR in the last 72 hours.  Assessment/Plan: Pt is POD 2 s/p left TKA.    -Plan to discharge to home today  -WBAT with a walker  -Okay to shower, dressing is waterproof.  Cautioned patient against soaking dressing in bath/pool/body of water  -Encouraged the use of the blue cradle boot to work on extension.  Cautioned patient against using a pillow under their knee.  -Use the CPM machine at least 3 times per day for one hour each time, increasing the degrees daily.     [name]  [name] 07/02/2019, 1:17 PM

## 2019-07-02 NOTE — Progress Notes (Addendum)
Physical Therapy Treatment Patient Details Name: [name] Bell MRN: [id number] DOB: [birth year] Today's Date: 07/02/2019    History of Present Illness 85 y.o. male admitted on 06/30/19 for elective L TKA, WBAT post op.  Pt with significant PMH of oral cavity carcinoma, DM, asthmatic bronchitis, anxiety, L TSA, R shoulder arthroscopy, R TKA.      PT Comments    Pt performed supine LE exercises with better ability to perform without assistance.  Post exercises his partner arrived with change of clothes for d/c home.  Pt required assistance and cues to perform Lower body dressing.  Pt left sitting edge of bed awaiting d/c.  Deferred further gt training as patient presents with fatigue and DOE after lower body dressing.      Follow Up Recommendations  Follow surgeon's recommendation for DC plan and follow-up therapies;Supervision for mobility/OOB     Equipment Recommendations  None recommended by PT    Recommendations for Other Services       Precautions / Restrictions Precautions Precautions: Knee Precaution Booklet Issued: Yes (comment) Precaution Comments: knee exercise handout given, precautions reviewed.  Restrictions Weight Bearing Restrictions: Yes LLE Weight Bearing: Weight bearing as tolerated    Mobility  Bed Mobility Overal bed mobility: Needs Assistance Bed Mobility: Supine to Sit     Supine to sit: Supervision     General bed mobility comments: Pt able to use hooking motion without assistance to achieve sitting edge of bed.  Transfers Overall transfer level: Needs assistance Equipment used: Rolling walker (2 wheeled) Transfers: Sit to/from Stand Sit to Stand: Supervision         General transfer comment: Repeated cueing for hand placement.  Ambulation/Gait         General Gait Details: Gt deferred as patient ftaigued from dressing post exercises.   Stairs        Wheelchair Mobility    Modified Rankin (Stroke Patients Only)        Balance Overall balance assessment: Needs assistance Sitting-balance support: Feet supported;No upper extremity supported Sitting balance-Leahy Scale: Good       Standing balance-Leahy Scale: Fair                              Cognition Arousal/Alertness: Awake/alert Behavior During Therapy: WFL for tasks assessed/performed Overall Cognitive Status: Within Functional Limits for tasks assessed Area of Impairment: Awareness                               General Comments: "I was scared yesterday, I thought I had a mini stroke."      Exercises Total Joint Exercises Ankle Circles/Pumps: AROM;Right;20 reps;Supine Quad Sets: AROM;Left;10 reps;Supine Heel Slides: Left;10 reps;Supine;AROM Hip ABduction/ADduction: AROM;Left;10 reps;Supine Straight Leg Raises: Left;10 reps;Supine;AAROM Goniometric ROM: 10-78 degrees.    General Comments        Pertinent Vitals/Pain Pain Assessment: 0-10 Pain Score: 4  Pain Location: left knee Pain Descriptors / Indicators: Grimacing;Guarding Pain Intervention(s): Monitored during session;Repositioned    Home Living                      Prior Function            PT Goals (current goals can now be found in the care plan section) Acute Rehab PT Goals Patient Stated Goal: to get back to an active lifestyle Potential to Achieve Goals: Good Progress towards  PT goals: Progressing toward goals    Frequency    7X/week      PT Plan Current plan remains appropriate    Co-evaluation              AM-PAC PT "6 Clicks" Mobility   Outcome Measure  Help needed turning from your back to your side while in a flat bed without using bedrails?: None Help needed moving from lying on your back to sitting on the side of a flat bed without using bedrails?: None Help needed moving to and from a bed to a chair (including a wheelchair)?: None Help needed standing up from a chair using your arms (e.g., wheelchair  or bedside chair)?: None Help needed to walk in hospital room?: A Little Help needed climbing 3-5 steps with a railing? : A Little 6 Click Score: 22    End of Session Equipment Utilized During Treatment: Gait belt Activity Tolerance: Patient limited by pain Patient left: in bed;with call bell/phone within reach;with family/visitor present Nurse Communication: Mobility status PT Visit Diagnosis: Muscle weakness (generalized) (M62.81);Difficulty in walking, not elsewhere classified (R26.2);Pain Pain - Right/Left: Left Pain - part of body: Knee     Time: 1351-1410 PT Time Calculation (min) (ACUTE ONLY): 19 min  Charges:  $Gait Training: 8-22 mins $Therapeutic Exercise: 8-22 mins                     Joycelyn Rua, PTA Acute Rehabilitation Services Pager [phone] Office [phone]     [name] Bell Artis Delay 07/02/2019, 2:15 PM

## 2019-07-06 ENCOUNTER — Telehealth: Payer: Self-pay | Admitting: Orthopedic Surgery

## 2019-07-06 NOTE — Telephone Encounter (Signed)
Patient would like to talk to someone about his pain. His call back number is [phone]

## 2019-07-06 NOTE — Telephone Encounter (Signed)
I s/w patient he will increase to 2 po q 4hrs prn

## 2019-07-06 NOTE — Telephone Encounter (Signed)
thx

## 2019-07-07 NOTE — Discharge Summary (Signed)
Physician Discharge Summary      Patient ID: [name] Bell MRN: [id number] DOB/AGE: December 12, 1933 85 y.o.  Admit date: 06/30/2019 Discharge date: 07/02/2019  Admission Diagnoses:  Active Problems:   Arthritis of left knee   Discharge Diagnoses:  Same  Surgeries: Procedure(s): LEFT TOTAL KNEE ARTHROPLASTY, CEMENTED on 06/30/2019   Consultants:   Discharged Condition: Stable  Hospital Course: [name] Bell is an 85 y.o. male who was admitted 06/30/2019 with a chief complaint of left knee pain, and found to have a diagnosis of left knee arthritis.  They were brought to the operating room on 06/30/2019 and underwent the above named procedures.  Pt awoke from anesthesia without complication and was transferred to the floor. On POD1, patient's pain was well-controlled and he mobilized well in therapy.  On the night of POD1, patient had an episode of confusion where he thought he was home in his kitchen while in his room on the floor.  He returned to baseline cognition without any lingering effects later that evening.  No focal neurologic deficits, sensation deficits, facial drooping or weakness on exam on POD2.  Patient was alert and oriented to his situation, self, place, and time on POD2.  He was discharged on POD2.  Pt will f/u with Dr. Marlou Sa in clinic in ~2 weeks.   Antibiotics given:  Anti-infectives (From admission, onward)   Start     Dose/Rate Route Frequency Ordered Stop   07/01/19 0600  ceFAZolin (ANCEF) IVPB 2g/100 mL premix     2 g 200 mL/hr over 30 Minutes Intravenous On call to O.R. 06/30/19 1346 07/01/19 0702   07/01/19 0030  ceFAZolin (ANCEF) IVPB 2g/100 mL premix     2 g 200 mL/hr over 30 Minutes Intravenous Every 6 hours 06/30/19 2200 07/01/19 0700   06/30/19 1943  vancomycin (VANCOCIN) powder  Status:  Discontinued       As needed 06/30/19 1944 06/30/19 2049   06/30/19 1410  ceFAZolin (ANCEF) 2-4 GM/100ML-% IVPB    Note to Pharmacy: Marga Melnick   : cabinet override      06/30/19 1410 06/30/19 1800    .  Recent vital signs:  Vitals:   07/02/19 0412 07/02/19 0912  BP: 120/66 132/79  Pulse: 69 71  Resp: 17 16  Temp: 97.7 F (36.5 C) 97.9 F (36.6 C)  SpO2: 100% 94%    Recent laboratory studies:  Results for orders placed or performed during the hospital encounter of 06/30/19  Glucose, capillary  Result Value Ref Range   Glucose-Capillary 153 (H) 70 - 99 mg/dL  Glucose, capillary  Result Value Ref Range   Glucose-Capillary 136 (H) 70 - 99 mg/dL  Glucose, capillary  Result Value Ref Range   Glucose-Capillary 186 (H) 70 - 99 mg/dL  Glucose, capillary  Result Value Ref Range   Glucose-Capillary 342 (H) 70 - 99 mg/dL  Glucose, capillary  Result Value Ref Range   Glucose-Capillary 378 (H) 70 - 99 mg/dL  Glucose, capillary  Result Value Ref Range   Glucose-Capillary 318 (H) 70 - 99 mg/dL  Glucose, capillary  Result Value Ref Range   Glucose-Capillary 362 (H) 70 - 99 mg/dL  Glucose, capillary  Result Value Ref Range   Glucose-Capillary 248 (H) 70 - 99 mg/dL  Glucose, capillary  Result Value Ref Range   Glucose-Capillary 196 (H) 70 - 99 mg/dL  Glucose, capillary  Result Value Ref Range   Glucose-Capillary 278 (H) 70 - 99 mg/dL    Discharge Medications:  Allergies as of 07/02/2019      Reactions   Thorazine [chlorpromazine] Other (See Comments)   Hallucinations. Patient does not want to take anymore.   Sulfa Antibiotics Hives      Medication List    STOP taking these medications   aspirin 325 MG EC tablet Replaced by: aspirin 81 MG chewable tablet   HYDROcodone-acetaminophen 5-325 MG tablet Commonly known as: NORCO/VICODIN     TAKE these medications   aspirin 81 MG chewable tablet Chew 1 tablet (81 mg total) by mouth 2 (two) times daily. Replaces: aspirin 325 MG EC tablet   BETA CAROTENE PO Take 7,500 mcg by mouth daily.   Cinnamon 500 MG Tabs Take 500 mg by mouth every evening.   CLEAR EYES OP Place 1 drop into  both eyes daily as needed (dry eyes).   docusate sodium 100 MG capsule Commonly known as: COLACE Take 1 capsule (100 mg total) by mouth 2 (two) times daily.   GTF Chromium 200 MCG Tabs Generic drug: Chromium Take 200 mcg by mouth daily.   HumuLIN N 100 UNIT/ML injection Generic drug: insulin NPH Human Inject 30 Units into the skin 2 (two) times daily.   Magnesium 250 MG Tabs Take 250 mg by mouth daily.   metFORMIN 500 MG 24 hr tablet Commonly known as: GLUCOPHAGE-XR Take 500 mg by mouth 2 (two) times daily.   multivitamin with minerals tablet Take 1 tablet by mouth daily.   ONE TOUCH ULTRA TEST test strip Generic drug: glucose blood USE AS DIRECTED TWICE DAILY   onetouch ultrasoft lancets USE TO CHECK BLOOD SUGAR TWICE DAILY   oxyCODONE 5 MG immediate release tablet Commonly known as: Oxy IR/ROXICODONE Take 1 tablet (5 mg total) by mouth every 4 (four) hours as needed for moderate pain (pain score 4-6).   Potassium 99 MG Tabs Take 99 mg by mouth daily.   pravastatin 20 MG tablet Commonly known as: PRAVACHOL Take 20 mg by mouth every evening.   tiZANidine 2 MG tablet Commonly known as: ZANAFLEX Take 1 tablet (2 mg total) by mouth every 8 (eight) hours as needed.   TURMERIC PO Take 1 tablet by mouth 2 (two) times daily.   Victoza 18 MG/3ML Sopn Generic drug: liraglutide Inject 1.8 mg into the skin daily.   vitamin B-12 1000 MCG tablet Commonly known as: CYANOCOBALAMIN Take 1,000 mcg by mouth daily.       Diagnostic Studies: No results found.  Disposition:   Discharge Instructions    Call MD / Call 911   Complete by: As directed    If you experience chest pain or shortness of breath, CALL 911 and be transported to the hospital emergency room.  If you develope a fever above 101 F, pus (white drainage) or increased drainage or redness at the wound, or calf pain, call your surgeon's office.   Constipation Prevention   Complete by: As directed    Drink  plenty of fluids.  Prune juice may be helpful.  You may use a stool softener, such as Colace (over the counter) 100 mg twice a day.  Use MiraLax (over the counter) for constipation as needed.   Diet - low sodium heart healthy   Complete by: As directed    Discharge instructions   Complete by: As directed    You may shower, dressing is waterproof.  Do not remove the dressing, we will remove it at your first post-op appointment.  Do not take a bath or soak the  knee in a tub or pool.  You may weightbear as you can tolerate on the operative leg with a walker.  Continue using the CPM machine 3 times per day for at least one hour each time, increasing the degrees of range of motion daily.  Use the blue cradle boot or a pillow under your heel to work on getting your leg straight.  Do NOT put a pillow under your knee.  You will follow-up with Dr. Marlou Sa in the clinic in 1-2 weeks at your given appointment date.   Increase activity slowly as tolerated   Complete by: As directed       Follow-up Information    [facility] Follow up.   Specialty: Home Health Services Why: Home Health Physical Therapy Contact information: [address] [place] [address] [phone]            Signed: Donella Stade 07/07/2019, 10:43 AM

## 2019-07-09 ENCOUNTER — Telehealth: Payer: Self-pay | Admitting: Orthopedic Surgery

## 2019-07-09 ENCOUNTER — Other Ambulatory Visit: Payer: Self-pay | Admitting: Surgical

## 2019-07-09 NOTE — Telephone Encounter (Signed)
Please advise. Thanks.  

## 2019-07-09 NOTE — Telephone Encounter (Signed)
Lauren, please advise, thank you.

## 2019-07-09 NOTE — Telephone Encounter (Signed)
See other note

## 2019-07-09 NOTE — Telephone Encounter (Signed)
Patient called. He is at a 10 level pain. He would like oxycodone 10 MG called in to [facility]. His call back number is [phone]

## 2019-07-09 NOTE — Telephone Encounter (Signed)
Pt called in said he is in extreme pain and is requesting a refill on his oxycodone but for it to be changed to 10 MG instead of 5 MG. Please have that sent to [facility]. Pt is very upset because he has not heard anything back from anyone and he believes he is dying and is crying.     [phone]

## 2019-07-09 NOTE — Telephone Encounter (Signed)
Okay for this.  1 p.o. every 4-6 hours as needed pain #40 with no refills thanks

## 2019-07-09 NOTE — Telephone Encounter (Signed)
You already sent in the 5mg  but see note from Dr Marlou Sa Please advise.  Patient said he feels like he is at deaths door because he is hurting so bad. Said he is crying because pain is so severe. I advised pain to be expected since did have major surgery.

## 2019-07-10 ENCOUNTER — Other Ambulatory Visit: Payer: Self-pay | Admitting: Surgical

## 2019-07-10 MED ORDER — OXYCODONE-ACETAMINOPHEN 10-325 MG PO TABS: 1.0000 | ORAL_TABLET | ORAL | 0 refills | Status: DC | PRN

## 2019-07-15 ENCOUNTER — Ambulatory Visit (INDEPENDENT_AMBULATORY_CARE_PROVIDER_SITE_OTHER): Payer: Medicare Other | Admitting: Orthopedic Surgery

## 2019-07-15 ENCOUNTER — Ambulatory Visit (INDEPENDENT_AMBULATORY_CARE_PROVIDER_SITE_OTHER): Payer: Medicare Other

## 2019-07-15 DIAGNOSIS — Z96659 Presence of unspecified artificial knee joint: Secondary | ICD-10-CM

## 2019-07-17 ENCOUNTER — Encounter: Payer: Self-pay | Admitting: Orthopedic Surgery

## 2019-07-17 NOTE — Progress Notes (Signed)
Post-Op Visit Note   Patient: [name] Bell           Date of Birth: May 01, 1934           MRN: [id number] Visit Date: 07/15/2019 PCP: Governor Specking, MD   Assessment & Plan:  Chief Complaint:  Chief Complaint  Patient presents with  . Left Knee - Routine Post Op   Visit Diagnoses:  1. Status post total knee replacement, unspecified laterality     Plan: Yohannes is a patient who is now 2 weeks out left total knee replacement.  He is walking very well with his walker.  Incision is intact.  He is at home.  On him to start physical therapy at his [facility] location.  They did very well with his shoulder and other knee.  CPM is at 72.  On exam he does have good full extension and flexion pretty easily to about 70 degrees.  Extensor mechanism is intact.  No calf tenderness and negative Homans today but the expected amount of swelling in the leg is present.  I will see him back in 4 weeks for clinical recheck.  He is taking oxycodone which helps.  We will try to start weaning him off of that in the near future  Follow-Up Instructions: Return in about 4 weeks (around 08/12/2019).   Orders:  Orders Placed This Encounter  Procedures  . XR Knee 1-2 Views Left   No orders of the defined types were placed in this encounter.   Imaging: No results found.  PMFS History: Patient Active Problem List   Diagnosis Date Noted  . Arthritis of left knee 06/30/2019  . Arthritis of right shoulder region 09/24/2016  . Arthritis of shoulder region, left 02/22/2015  . Type II or unspecified type diabetes mellitus without mention of complication, not stated as uncontrolled 02/11/2012  . Hypogonadism male 02/11/2012  . Arthritis of knee, right 10/30/2011   Past Medical History:  Diagnosis Date  . Allergy    RHINITIS  . Ambulates with cane   . Anxiety   . Arthritis    osteoarthritis  . Asthmatic bronchitis    seasonal  . Cancer ([place])    skin cancer removed from back  . Depression   .  Diabetes mellitus   . FHx: cardiovascular disease   . History of kidney stones   . History of pneumonia    reports d/t bronchitis  . Hx of colonic polyps   . Hypogonadism male   . 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[place])   . Pneumonia 2019  . PONV (postoperative nausea and vomiting)    reports has a difficult time coming out of ( knee surgery)  . Seborrheic keratosis     History reviewed. No pertinent family history.  Past Surgical History:  Procedure Laterality Date  . CHOLECYSTECTOMY  2010   GALL BLADDER  . HERNIA REPAIR     during cholecystectomy  . KNEE ARTHROPLASTY  10/30/2011   Procedure: COMPUTER ASSISTED TOTAL KNEE ARTHROPLASTY;  Surgeon: Meredith Pel, MD;  Location: [facility];  Service: Orthopedics;  Laterality: Right;  right total knee arthroplasty  . KNEE CARTILAGE SURGERY  2009   Right knee  . PARTIAL GLOSSECTOMY  2017   at [facility]  . SHOULDER ARTHROSCOPY Right   . TOTAL KNEE ARTHROPLASTY Left 06/30/2019   Procedure: LEFT TOTAL KNEE ARTHROPLASTY, CEMENTED;  Surgeon: Meredith Pel, MD;  Location: [facility];  Service: Orthopedics;  Laterality: Left;  . TOTAL SHOULDER ARTHROPLASTY  Left 02/22/2015  . TOTAL SHOULDER ARTHROPLASTY Left 02/22/2015   Procedure: LEFT TOTAL SHOULDER ARTHROPLASTY;  Surgeon: Meredith Pel, MD;  Location: [facility];  Service: Orthopedics;  Laterality: Left;   Social History   Occupational History  . Not on file  Tobacco Use  . Smoking status: Never Smoker  . Smokeless tobacco: Never Used  Substance and Sexual Activity  . Alcohol use: Yes    Alcohol/week: 3.0 standard drinks    Types: 3 Glasses of wine per week    Comment: occasional  . Drug use: No  . Sexual activity: Not Currently

## 2019-07-27 ENCOUNTER — Telehealth: Payer: Self-pay | Admitting: Orthopedic Surgery

## 2019-07-27 DIAGNOSIS — Z96659 Presence of unspecified artificial knee joint: Secondary | ICD-10-CM

## 2019-07-27 NOTE — Telephone Encounter (Signed)
Order put in. S/w patient and advised. He has PT set up

## 2019-07-27 NOTE — Telephone Encounter (Signed)
Patient called stating that he has not heard anything from anyone about his PT and making an appointment.  CB#[phone].  Thank you

## 2019-07-30 ENCOUNTER — Other Ambulatory Visit: Payer: Self-pay

## 2019-07-30 ENCOUNTER — Ambulatory Visit: Payer: Medicare Other | Attending: Orthopedic Surgery | Admitting: Physical Therapy

## 2019-07-30 DIAGNOSIS — M25562 Pain in left knee: Secondary | ICD-10-CM | POA: Diagnosis present

## 2019-07-30 DIAGNOSIS — R600 Localized edema: Secondary | ICD-10-CM | POA: Diagnosis present

## 2019-07-30 DIAGNOSIS — R2689 Other abnormalities of gait and mobility: Secondary | ICD-10-CM | POA: Insufficient documentation

## 2019-07-30 DIAGNOSIS — M25662 Stiffness of left knee, not elsewhere classified: Secondary | ICD-10-CM

## 2019-07-30 DIAGNOSIS — M6281 Muscle weakness (generalized): Secondary | ICD-10-CM

## 2019-07-30 DIAGNOSIS — R262 Difficulty in walking, not elsewhere classified: Secondary | ICD-10-CM | POA: Diagnosis present

## 2019-07-30 NOTE — Therapy (Signed)
[facility] [address]  [place], [place], [address] Phone: [phone]   Fax:  [phone]  Physical Therapy Evaluation  Patient Details  Name: [name] Bell MRN: [id number] Date of Birth: [birth year] Referring Provider (PT): Meredith Pel, MD   Encounter Date: 07/30/2019  PT End of Session - 07/30/19 1447    Visit Number  1    Number of Visits  16    Date for PT Re-Evaluation  09/10/19    Authorization Type  [employer] Medicare    PT Start Time  1447    PT Stop Time  1536    PT Time Calculation (min)  49 min    Activity Tolerance  Patient tolerated treatment well    Behavior During Therapy  [facility] for tasks assessed/performed       Past Medical History:  Diagnosis Date  . Allergy    RHINITIS  . Ambulates with cane   . Anxiety   . Arthritis    osteoarthritis  . Asthmatic bronchitis    seasonal  . Cancer ([place])    skin cancer removed from back  . Depression   . Diabetes mellitus   . FHx: cardiovascular disease   . History of kidney stones   . History of pneumonia    reports d/t bronchitis  . Hx of colonic polyps   . Hypogonadism male   . 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 Pneumonia 2019  . PONV (postoperative nausea and vomiting)    reports has a difficult time coming out of ( knee surgery)  . Seborrheic keratosis     Past Surgical History:  Procedure Laterality Date  . CHOLECYSTECTOMY  2010   GALL BLADDER  . HERNIA REPAIR     during cholecystectomy  . KNEE ARTHROPLASTY  10/30/2011   Procedure: COMPUTER ASSISTED TOTAL KNEE ARTHROPLASTY;  Surgeon: Meredith Pel, MD;  Location: [place];  Service: Orthopedics;  Laterality: Right;  right total knee arthroplasty  . KNEE CARTILAGE SURGERY  2009   Right knee  . PARTIAL GLOSSECTOMY  2017   at [facility]  . SHOULDER ARTHROSCOPY Right   . TOTAL KNEE ARTHROPLASTY Left 06/30/2019   Procedure: LEFT TOTAL KNEE ARTHROPLASTY, CEMENTED;  Surgeon: Meredith Pel, MD;  Location: [place];  Service: Orthopedics;  Laterality: Left;  . TOTAL SHOULDER ARTHROPLASTY Left 02/22/2015  . TOTAL SHOULDER ARTHROPLASTY Left 02/22/2015   Procedure: LEFT TOTAL SHOULDER ARTHROPLASTY;  Surgeon: Meredith Pel, MD;  Location: [place];  Service: Orthopedics;  Laterality: Left;    There were no vitals filed for this visit.   Subjective Assessment - 07/30/19 1450    Subjective  One month since surgery. Had [facility] PT until last Friday. Pain has been variable but can be very bad at times, causing him to continue to rely primarily on RW but does use cane some within home. Notes fear of falling backward when climbing stairs, so currently avoiding basement but clothes dryer and model train setup are located in basement.    Pertinent History  L TKA - 06/30/19; R TKA - 2013; L TSA - 2016    Limitations  Standing;Sitting;Walking;House hold activities    How long can you sit comfortably?  20-25 minutes (difficulty sitting through meal at table)    How long can you stand comfortably?  20 minutes    How long can you walk comfortably?  fairly comfortable walking with RW; 1/2 the distance with SPC, only  a few feet w/o AD    Patient Stated Goals  "To move the L knee & leg with as little pain as possible"    Currently in Pain?  Yes    Pain Score  7     Pain Location  Knee    Pain Orientation  Left    Pain Descriptors / Indicators  Discomfort    Pain Type  Surgical pain;Acute pain    Pain Radiating Towards  into L thigh    Pain Onset  1 to 4 weeks ago    Pain Frequency  Constant    Aggravating Factors   varies    Pain Relieving Factors  propping leg up in blue foam support provided at time of surgery    Effect of Pain on Daily Activities  limited ambulation tolerance; increased fear of falling         [facility] PT Assessment - 07/30/19 1447      Assessment   Medical Diagnosis  L TKA    Referring Provider (PT)  Meredith Pel, MD    Onset Date/Surgical Date  06/30/19    Next  MD Visit  08/12/19    Prior Therapy  HH PT for 3 weeks      Precautions   Precautions  None      Restrictions   Weight Bearing Restrictions  Yes    LLE Weight Bearing  Weight bearing as tolerated      Balance Screen   Has the patient fallen in the past 6 months  No    Has the patient had a decrease in activity level because of a fear of falling?   Yes    Is the patient reluctant to leave their home because of a fear of falling?   No      Home Environment   Living Environment  Private residence    Available Help at Discharge  Personal care attendant    Type of [place] Access  Level entry    Home Layout  Laundry or work area in basement      Prior Function   Level of [place]  Retired    Leisure  model trains; piano; walking daily & riding bike      Cognition   Overall Cognitive Status  Within Functional Limits for tasks assessed      Observation/Other Assessments   Focus on Therapeutic Outcomes (FOTO)   Not available - will assess next visit      Sensation   Light Touch  Appears Intact   slight hypersensitivity reported     ROM / Strength   AROM / PROM / Strength  AROM;Strength      AROM   AROM Assessment Site  Knee    Right/Left Knee  Right;Left    Right Knee Extension  0    Right Knee Flexion  122    Left Knee Extension  5   17 dg quad lag with SLR   Left Knee Flexion  106      Strength   Overall Strength Comments  tested    Strength Assessment Site  Hip;Knee    Right/Left Hip  Right;Left    Right Hip Flexion  5/5    Right Hip Extension  4+/5    Right Hip ABduction  4+/5    Right Hip ADduction  4+/5    Left Hip Flexion  4-/5    Left Hip  Extension  4-/5    Left Hip ABduction  4/5    Left Hip ADduction  4-/5    Right/Left Knee  Right;Left    Right Knee Flexion  5/5    Right Knee Extension  5/5    Left Knee Flexion  4/5    Left Knee Extension  4/5      Ambulation/Gait   Ambulation/Gait  Yes    Ambulation/Gait  Assistance  6: Modified independent (Device/Increase time)    Ambulation Distance (Feet)  80 Feet    Assistive device  Rolling walker    Gait Pattern  Step-through pattern;Antalgic;Decreased weight shift to left;Decreased stance time - left;Decreased stride length;Decreased hip/knee flexion - left;Decreased hip/knee flexion - right    Ambulation Surface  Level;Indoor                Objective measurements completed on examination: See above findings.      [facility] PT Treatment/Exercise - 07/30/19 1447      Self-Care   Self-Care  Scar Mobilizations    Scar Mobilizations  Instructed pt in incisional scar mobilization to L TKA incision.      Exercises   Exercises  Knee/Hip      Knee/Hip Exercises: Supine   Patellar Mobs  Instructed pt in L knee patellar lateral and inf/sup self-mobs.             PT Education - 07/30/19 1532    Education Details  PT eval findings, anticipated POC and initial instructions in patellar mobs and incisional scar mobilization    Person(s) Educated  Patient    Methods  Explanation;Demonstration    Comprehension  Verbalized understanding;Returned demonstration;Need further instruction       PT Short Term Goals - 07/30/19 1536      PT SHORT TERM GOAL #1   Title  Patient will be independent with initial HEP    Status  New    Target Date  08/20/19      PT SHORT TERM GOAL #2   Title  Patient will safely ambulate with SPC demostrating good gait pattern to increase independence with community mobility    Status  New    Target Date  08/20/19        PT Long Term Goals - 07/30/19 1536      PT LONG TERM GOAL #1   Title  Patient will be independent with advanced/ongoing HEP    Status  New    Target Date  09/10/19      PT LONG TERM GOAL #2   Title  L knee AROM >/= 2-120 dg to allow for normal gait and stair mechanics    Status  New    Target Date  09/10/19      PT LONG TERM GOAL #3   Title  L hip and knee strength >/= 4+/5 with  pain provocation    Status  New    Target Date  09/10/19      PT LONG TERM GOAL #4   Title  Patient will ambulate with or without LRAD on all surfaces with good gait pattern    Status  New    Target Date  09/10/19      PT LONG TERM GOAL #5   Title  Patient will safely navigate full flight of stairs with single rail support to allow safe access to basement in home    Status  New    Target Date  09/10/19      PT  LONG TERM GOAL #6   Title  Pain level will be no more than 2/10 with all functional activities and patient will be able to sleep through the night w/o pain interference    Status  New    Target Date  09/10/19             Plan - 07/30/19 1536    Clinical Impression Statement  Thelton is an 85 y/o male who presents to OP PT 30 days s/p L TKA on 06/30/19. He completed HH PT as of last Friday and arrives to PT using RW due ongoing variable to high levels of pain in L knee and thigh at level of surgical tourniquet (bruising from tourniquet still visible). Patient today with limited and painful L knee ROM (AROM 5-106 with 17 dg quad lag on SLR), decreased strength, decreased patellar and incisional scar mobility, edema, decreased balance and gait deviations. Deficits limit activity and walking tolerance requiring continued dependence on AD due to pain and fear of falling, require frequent changes of position and interfere with sleep. Diem will benefit from skilled PT intervention to address the above listed deficits, reduce pain, and restore functional ROM and strength to allow for improved knee stability for improved balance and gait to maximize function and mobility to allow for safe mobility within home and community.    Personal Factors and Comorbidities  Age;Comorbidity 3+;Transportation    Comorbidities  R TKA - 2013; L TSA - 2016; OA; DM; skin and oral cancers; asthmatic bronchitis; anxiety & depression    Examination-Activity Limitations  Bathing;Bed  Mobility;Bend;Carry;Dressing;Hygiene/Grooming;Locomotion Level;Sit;Squat;Stairs;Stand;Toileting;Transfers    Examination-Participation Restrictions  Community Activity;Driving;Laundry    Stability/Clinical Decision Making  Unstable/Unpredictable    Clinical Decision Making  High    Rehab Potential  Good    PT Frequency  3x / week   x 3 weeks, tapering to 2x/wk for duration of POC   PT Duration  6 weeks    PT Treatment/Interventions  ADLs/Self Care Home Management;Cryotherapy;Electrical Stimulation;Iontophoresis 4mg /ml Dexamethasone;Moist Heat;DME Instruction;Gait training;Stair training;Functional mobility training;Therapeutic activities;Therapeutic exercise;Balance training;Neuromuscular re-education;Patient/family education;Manual techniques;Scar mobilization;Passive range of motion;Dry needling;Energy conservation;Taping;Vasopneumatic Device;Joint Manipulations    PT Next Visit Plan  FOTO; gait assessment with cane; review and update HH HEP as appropriate; manual therapy for ROM and STM; modalities PRN    PT Home Exercise Plan  07/30/19 - incisional scar mobilization, patellar mobs, gentle self-STM to quads with rolling pin    Consulted and Agree with Plan of Care  Patient       Patient will benefit from skilled therapeutic intervention in order to improve the following deficits and impairments:  Abnormal gait, Cardiopulmonary status limiting activity, Decreased activity tolerance, Decreased balance, Decreased endurance, Decreased knowledge of use of DME, Decreased mobility, Decreased range of motion, Decreased safety awareness, Decreased scar mobility, Decreased strength, Difficulty walking, Increased edema, Increased muscle spasms, Impaired flexibility, Impaired sensation, Improper body mechanics, Postural dysfunction, Pain  Visit Diagnosis: Acute pain of left knee  Stiffness of left knee, not elsewhere classified  Other abnormalities of gait and mobility  Difficulty in walking, not  elsewhere classified  Muscle weakness (generalized)  Localized edema     Problem List Patient Active Problem List   Diagnosis Date Noted  . Arthritis of left knee 06/30/2019  . Arthritis of right shoulder region 09/24/2016  . Arthritis of shoulder region, left 02/22/2015  . Type II or unspecified type diabetes mellitus without mention of complication, not stated as uncontrolled 02/11/2012  . Hypogonadism male 02/11/2012  .  Arthritis of knee, right 10/30/2011    Percival Spanish, PT, MPT 07/30/2019, 7:01 PM  [facility] [address]  [address] [place], [place], [address] Phone: [phone]   Fax:  [phone]  Name: JAIKOB ISHIMOTO MRN: [id number] Date of Birth: 25-Jun-1934

## 2019-08-04 ENCOUNTER — Ambulatory Visit: Payer: Medicare Other | Admitting: Physical Therapy

## 2019-08-04 ENCOUNTER — Other Ambulatory Visit: Payer: Self-pay

## 2019-08-04 ENCOUNTER — Encounter: Payer: Self-pay | Admitting: Physical Therapy

## 2019-08-04 ENCOUNTER — Other Ambulatory Visit: Payer: Self-pay | Admitting: Surgical

## 2019-08-04 DIAGNOSIS — R600 Localized edema: Secondary | ICD-10-CM

## 2019-08-04 DIAGNOSIS — M25562 Pain in left knee: Secondary | ICD-10-CM | POA: Diagnosis not present

## 2019-08-04 DIAGNOSIS — M25662 Stiffness of left knee, not elsewhere classified: Secondary | ICD-10-CM

## 2019-08-04 DIAGNOSIS — M6281 Muscle weakness (generalized): Secondary | ICD-10-CM

## 2019-08-04 DIAGNOSIS — R2689 Other abnormalities of gait and mobility: Secondary | ICD-10-CM

## 2019-08-04 DIAGNOSIS — R262 Difficulty in walking, not elsewhere classified: Secondary | ICD-10-CM

## 2019-08-04 NOTE — Therapy (Signed)
[facility] [address]  [place], [place], [address] Phone: [phone]   Fax:  [phone]  Physical Therapy Treatment  Patient Details  Name: [name] Bell MRN: [id number] Date of Birth: [birth year] Referring Provider (PT): Meredith Pel, MD   Encounter Date: 08/04/2019  PT End of Session - 08/04/19 1452    Visit Number  2    Number of Visits  16    Date for PT Re-Evaluation  09/10/19    Authorization Type  [employer]HC Medicare    PT Start Time  [id number]   pt arrived late   PT Stop Time  1541    PT Time Calculation (min)  49 min    Activity Tolerance  Patient tolerated treatment well    Behavior During Therapy  [facility] for tasks assessed/performed       Past Medical History:  Diagnosis Date  . Allergy    RHINITIS  . Ambulates with cane   . Anxiety   . Arthritis    osteoarthritis  . Asthmatic bronchitis    seasonal  . Cancer ([place])    skin cancer removed from back  . Depression   . Diabetes mellitus   . FHx: cardiovascular disease   . History of kidney stones   . History of pneumonia    reports d/t bronchitis  . Hx of colonic polyps   . Hypogonadism male   . 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 Pneumonia 2019  . PONV (postoperative nausea and vomiting)    reports has a difficult time coming out of ( knee surgery)  . Seborrheic keratosis     Past Surgical History:  Procedure Laterality Date  . CHOLECYSTECTOMY  2010   GALL BLADDER  . HERNIA REPAIR     during cholecystectomy  . KNEE ARTHROPLASTY  10/30/2011   Procedure: COMPUTER ASSISTED TOTAL KNEE ARTHROPLASTY;  Surgeon: Meredith Pel, MD;  Location: [place];  Service: Orthopedics;  Laterality: Right;  right total knee arthroplasty  . KNEE CARTILAGE SURGERY  2009   Right knee  . PARTIAL GLOSSECTOMY  2017   at [facility]  . SHOULDER ARTHROSCOPY Right   . TOTAL KNEE ARTHROPLASTY Left 06/30/2019   Procedure: LEFT TOTAL KNEE ARTHROPLASTY, CEMENTED;  Surgeon:  Meredith Pel, MD;  Location: [place];  Service: Orthopedics;  Laterality: Left;  . TOTAL SHOULDER ARTHROPLASTY Left 02/22/2015  . TOTAL SHOULDER ARTHROPLASTY Left 02/22/2015   Procedure: LEFT TOTAL SHOULDER ARTHROPLASTY;  Surgeon: Meredith Pel, MD;  Location: [place];  Service: Orthopedics;  Laterality: Left;    There were no vitals filed for this visit.  Subjective Assessment - 08/04/19 1458    Subjective  Pt reports Mederma cream seems to be softening up the incision allowing it to become more flexible.    Pertinent History  L TKA - 06/30/19; R TKA - 2013; L TSA - 2016    Patient Stated Goals  "To move the L knee & leg with as little pain as possible"    Currently in Pain?  Yes    Pain Score  8     Pain Location  Knee    Pain Orientation  Left    Pain Descriptors / Indicators  Heaviness;Aching    Pain Type  Surgical pain;Acute pain    Pain Frequency  Constant         [facility] PT Assessment - 08/04/19 1452      Observation/Other Assessments   Focus  on Therapeutic Outcomes (FOTO)   Knee - 50% (50% limitation); Predicted 71% (29% limitation)                   [facility] Adult PT Treatment/Exercise - 08/04/19 1452      Ambulation/Gait   Ambulation/Gait Assistance  5: Supervision    Ambulation/Gait Assistance Details  Cues for increased hip and knee flexion with heel strike on weight acceptance.    Ambulation Distance (Feet)  180 Feet    Assistive device  Straight cane    Gait Pattern  Step-through pattern;Antalgic;Decreased weight shift to left;Decreased stance time - left;Decreased stride length;Decreased hip/knee flexion - left;Decreased hip/knee flexion - right    Ambulation Surface  Level;Indoor      Exercises   Exercises  Knee/Hip      Knee/Hip Exercises: Stretches   Passive Hamstring Stretch  Left;30 seconds;1 rep    Passive Hamstring Stretch Limitations  seated hip hinge      Knee/Hip Exercises: Aerobic   Nustep  L4 x 6 min (B UE/LE)      Knee/Hip  Exercises: Standing   Heel Raises  Both;15 reps;3 seconds    Heel Raises Limitations  cues to slow pace     Knee Flexion  Left;10 reps    Hip Flexion  Left;10 reps;Knee bent;Stengthening    Hip Abduction  Left;Right;10 reps;Knee straight;Stengthening;2 sets    Abduction Limitations  cues to slow pace and perform on B legs, 2nd set with looped yellow TB at ankles    Functional Squat  10 reps;3 seconds    Functional Squat Limitations  cues to slow pace      Knee/Hip Exercises: Seated   Heel Slides  Left;AAROM;10 reps    Heel Slides Limitations  overpressure from R foot to increase flexion ROM      Modalities   Modalities  Vasopneumatic      Vasopneumatic   Number Minutes Vasopneumatic   6 minutes   stopped at pt request d/t discomfort   Vasopnuematic Location   Knee    Vasopneumatic Pressure  Low    Vasopneumatic Temperature   40 dg   increased from 34 dg d/t cold intolerance            PT Education - 08/04/19 1555    Education Details  HEP update - seated HS stretch & quad sets, 3-way standing SLR with looped yellow TB    Person(s) Educated  Patient    Methods  Explanation;Demonstration;Handout    Comprehension  Verbalized understanding;Returned demonstration;Need further instruction       PT Short Term Goals - 08/04/19 1500      PT SHORT TERM GOAL #1   Title  Patient will be independent with initial HEP    Status  On-going    Target Date  08/20/19      PT SHORT TERM GOAL #2   Title  Patient will safely ambulate with SPC demostrating good gait pattern to increase independence with community mobility    Status  On-going    Target Date  08/20/19        PT Long Term Goals - 08/04/19 1500      PT LONG TERM GOAL #1   Title  Patient will be independent with advanced/ongoing HEP    Status  On-going    Target Date  09/10/19      PT LONG TERM GOAL #2   Title  L knee AROM >/= 2-120 dg to allow for normal gait and  stair mechanics    Status  On-going    Target  Date  09/10/19      PT LONG TERM GOAL #3   Title  L hip and knee strength >/= 4+/5 with pain provocation    Status  On-going    Target Date  09/10/19      PT LONG TERM GOAL #4   Title  Patient will ambulate with or without LRAD on all surfaces with good gait pattern    Status  On-going    Target Date  09/10/19      PT LONG TERM GOAL #5   Title  Patient will safely navigate full flight of stairs with single rail support to allow safe access to basement in home    Status  On-going    Target Date  09/10/19      PT LONG TERM GOAL #6   Title  Pain level will be no more than 2/10 with all functional activities and patient will be able to sleep through the night w/o pain interference    Status  On-going    Target Date  09/10/19            Plan - 08/04/19 1501    Clinical Impression Statement  [name] Bell noting improvement in scar flexibility after using Mederma which he feels allows his knee to bend more freely. Assessed gait with SPC with patient demonstrating good sequencing of SPC however cues required to increase B hip and knee flexion for improved foot clearance. Reviewed and updated HH PT HEP adding yellow TB resistance to standing hip abduction as well as encouraging B LE performance of standing exercises to promote improved stability with L LE single limb support. Treatment concluded with trial of vasopnuematic compression but discontinued due to patient report of cold intolerance.    Personal Factors and Comorbidities  Age;Comorbidity 3+;Transportation    Comorbidities  R TKA - 2013; L TSA - 2016; OA; DM; skin and oral cancers; asthmatic bronchitis; anxiety & depression    Rehab Potential  Good    PT Frequency  3x / week   x 3 weeks, tapering to 2x/wk for duration of POC   PT Duration  6 weeks    PT Treatment/Interventions  ADLs/Self Care Home Management;Cryotherapy;Electrical Stimulation;Iontophoresis 4mg /ml Dexamethasone;Moist Heat;DME Instruction;Gait training;Stair  training;Functional mobility training;Therapeutic activities;Therapeutic exercise;Balance training;Neuromuscular re-education;Patient/family education;Manual techniques;Scar mobilization;Passive range of motion;Dry needling;Energy conservation;Taping;Vasopneumatic Device;Joint Manipulations    PT Next Visit Plan  L knee ROM; B LE strengthenig; balance training; manual therapy for ROM and STM; modalities PRN (poor tolerance for vasopneumatic compression)    PT Home Exercise Plan  07/30/19 - incisional scar mobilization, patellar mobs, gentle self-STM to quads with rolling pin; 08/04/19 - seated HS stretch & quad sets, 3-way standing SLR with looped yellow TB    Consulted and Agree with Plan of Care  Patient       Patient will benefit from skilled therapeutic intervention in order to improve the following deficits and impairments:  Abnormal gait, Cardiopulmonary status limiting activity, Decreased activity tolerance, Decreased balance, Decreased endurance, Decreased knowledge of use of DME, Decreased mobility, Decreased range of motion, Decreased safety awareness, Decreased scar mobility, Decreased strength, Difficulty walking, Increased edema, Increased muscle spasms, Impaired flexibility, Impaired sensation, Improper body mechanics, Postural dysfunction, Pain  Visit Diagnosis: Acute pain of left knee  Stiffness of left knee, not elsewhere classified  Other abnormalities of gait and mobility  Difficulty in walking, not elsewhere classified  Muscle weakness (generalized)  Localized edema  Problem List Patient Active Problem List   Diagnosis Date Noted  . Arthritis of left knee 06/30/2019  . Arthritis of right shoulder region 09/24/2016  . Arthritis of shoulder region, left 02/22/2015  . Type II or unspecified type diabetes mellitus without mention of complication, not stated as uncontrolled 02/11/2012  . Hypogonadism male 02/11/2012  . Arthritis of knee, right 10/30/2011     Percival Spanish, PT, MPT 08/04/2019, 3:57 PM  [facility] [address]  [place], [place], [address] Phone: [phone]   Fax:  [phone]  Name: [name] Bell MRN: [id number] Date of Birth: 02-Sep-1934

## 2019-08-05 ENCOUNTER — Other Ambulatory Visit: Payer: Self-pay

## 2019-08-05 ENCOUNTER — Encounter: Payer: Self-pay | Admitting: Physical Therapy

## 2019-08-05 ENCOUNTER — Ambulatory Visit: Payer: Medicare Other | Admitting: Physical Therapy

## 2019-08-05 DIAGNOSIS — M25562 Pain in left knee: Secondary | ICD-10-CM | POA: Diagnosis not present

## 2019-08-05 DIAGNOSIS — M25662 Stiffness of left knee, not elsewhere classified: Secondary | ICD-10-CM

## 2019-08-05 DIAGNOSIS — M6281 Muscle weakness (generalized): Secondary | ICD-10-CM

## 2019-08-05 DIAGNOSIS — R600 Localized edema: Secondary | ICD-10-CM

## 2019-08-05 DIAGNOSIS — R2689 Other abnormalities of gait and mobility: Secondary | ICD-10-CM

## 2019-08-05 DIAGNOSIS — R262 Difficulty in walking, not elsewhere classified: Secondary | ICD-10-CM

## 2019-08-05 NOTE — Therapy (Signed)
[facility] [address]  [place], [place], [address] Phone: [phone]   Fax:  [phone]  Physical Therapy Treatment  Patient Details  Name: [name] Bell MRN: [id number] Date of Birth: 08/30/1934 Referring Provider (PT): [name] Pel, MD   Encounter Date: 08/05/2019  PT End of Session - 08/05/19 1055    Visit Number  3    Number of Visits  16    Date for PT Re-Evaluation  09/10/19    Authorization Type  [employer] Medicare    PT Start Time  1055    PT Stop Time  1138    PT Time Calculation (min)  43 min    Activity Tolerance  Patient tolerated treatment well    Behavior During Therapy  [facility] for tasks assessed/performed       Past Medical History:  Diagnosis Date  . Allergy    RHINITIS  . Ambulates with cane   . Anxiety   . Arthritis    osteoarthritis  . Asthmatic bronchitis    seasonal  . Cancer ([place])    skin cancer removed from back  . Depression   . Diabetes mellitus   . FHx: cardiovascular disease   . History of kidney stones   . History of pneumonia    reports d/t bronchitis  . Hx of colonic polyps   . Hypogonadism male   . 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 Pneumonia 2019  . PONV (postoperative nausea and vomiting)    reports has a difficult time coming out of ( knee surgery)  . Seborrheic keratosis     Past Surgical History:  Procedure Laterality Date  . CHOLECYSTECTOMY  2010   GALL BLADDER  . HERNIA REPAIR     during cholecystectomy  . KNEE ARTHROPLASTY  10/30/2011   Procedure: COMPUTER ASSISTED TOTAL KNEE ARTHROPLASTY;  Surgeon: [name] Pel, MD;  Location: [facility];  Service: Orthopedics;  Laterality: Right;  right total knee arthroplasty  . KNEE CARTILAGE SURGERY  2009   Right knee  . PARTIAL GLOSSECTOMY  2017   at [facility]  . SHOULDER ARTHROSCOPY Right   . TOTAL KNEE ARTHROPLASTY Left 06/30/2019   Procedure: LEFT TOTAL KNEE ARTHROPLASTY, CEMENTED;  Surgeon: [name] Pel, MD;  Location: [facility];  Service: Orthopedics;  Laterality: Left;  . TOTAL SHOULDER ARTHROPLASTY Left 02/22/2015  . TOTAL SHOULDER ARTHROPLASTY Left 02/22/2015   Procedure: LEFT TOTAL SHOULDER ARTHROPLASTY;  Surgeon: [name] Pel, MD;  Location: [facility];  Service: Orthopedics;  Laterality: Left;    There were no vitals filed for this visit.  Subjective Assessment - 08/05/19 1100    Subjective  Pt reporting increased soreness and overall general malaise today. Reports his pharmacy was able to help him get his oxycodone refilled, but he tries not to take it too much.    Pertinent History  L TKA - 06/30/19; R TKA - 2013; L TSA - 2016    Patient Stated Goals  "To move the L knee & leg with as little pain as possible"    Currently in Pain?  Yes    Pain Score  7    7.5/10   Pain Location  Knee    Pain Orientation  Left    Pain Descriptors / Indicators  Heaviness;Aching    Pain Type  Surgical pain;Acute pain    Pain Frequency  Constant         [facility] PT Assessment - 08/05/19  1055      AROM   Left Knee Extension  4    Left Knee Flexion  107      PROM   Left Knee Flexion  112                   [facility] Adult PT Treatment/Exercise - 08/05/19 1055      Exercises   Exercises  Knee/Hip      Knee/Hip Exercises: Stretches   Passive Hamstring Stretch  Left;30 seconds;2 reps    Passive Hamstring Stretch Limitations  supine with strap, 2nd rep with DF pull for calf stretch      Knee/Hip Exercises: Aerobic   Nustep  L3 x 6 min (B UE/LE)      Knee/Hip Exercises: Seated   Clamshell with TheraBand  Yellow   alt hip ABD/ER x 10   Other Seated Knee/Hip Exercises  L Fitter leg press (1 black) x 15    Hamstring Curl  Left;10 reps;Strengthening    Hamstring Limitations  yellow TB      Knee/Hip Exercises: Supine   Short Arc Quad Sets  Left;10 reps;2 sets;AROM;Strengthening    Short Arc Quad Sets Limitations  2nd set + 1# cuff weight; over 8" bolster    Hip Adduction  Isometric  Both;10 reps;Strengthening    Hip Adduction Isometric Limitations  5" ball squeeze - cues to avoid holding breath    Straight Leg Raises  Left;10 reps;Strengthening    Straight Leg Raises Limitations  1# - cues for quad set prior to initiation of lift    Knee Flexion  Left;AAROM;10 reps;2 sets    Knee Flexion Limitations  HS curls with heels on peanut ball; AAROM overpressure into flexion stretch using strap               PT Short Term Goals - 08/04/19 1500      PT SHORT TERM GOAL #1   Title  Patient will be independent with initial HEP    Status  On-going    Target Date  08/20/19      PT SHORT TERM GOAL #2   Title  Patient will safely ambulate with SPC demostrating good gait pattern to increase independence with community mobility    Status  On-going    Target Date  08/20/19        PT Long Term Goals - 08/04/19 1500      PT LONG TERM GOAL #1   Title  Patient will be independent with advanced/ongoing HEP    Status  On-going    Target Date  09/10/19      PT LONG TERM GOAL #2   Title  L knee AROM >/= 2-120 dg to allow for normal gait and stair mechanics    Status  On-going    Target Date  09/10/19      PT LONG TERM GOAL #3   Title  L hip and knee strength >/= 4+/5 with pain provocation    Status  On-going    Target Date  09/10/19      PT LONG TERM GOAL #4   Title  Patient will ambulate with or without LRAD on all surfaces with good gait pattern    Status  On-going    Target Date  09/10/19      PT LONG TERM GOAL #5   Title  Patient will safely navigate full flight of stairs with single rail support to allow safe access to basement in home  Status  On-going    Target Date  09/10/19      PT LONG TERM GOAL #6   Title  Pain level will be no more than 2/10 with all functional activities and patient will be able to sleep through the night w/o pain interference    Status  On-going    Target Date  09/10/19            Plan - 08/05/19 1102     Clinical Impression Statement  [name] Bell reporting increased overall soreness and general malaise today. Update HEP not yet attempted at home due to patient just here yesterday. Given this, focused on more supine and seated exercises to minimize strain and fatigue, with good tolerance and no increased pain reported. L knee ROM slightly improved to 4-106 dg. Will continue to progress ROM and strengthening exercises as tolerated.    Personal Factors and Comorbidities  Age;Comorbidity 3+;Transportation    Comorbidities  R TKA - 2013; L TSA - 2016; OA; DM; skin and oral cancers; asthmatic bronchitis; anxiety & depression    Rehab Potential  Good    PT Frequency  3x / week   x 3 weeks, tapering to 2x/wk for duration of POC   PT Duration  6 weeks    PT Treatment/Interventions  ADLs/Self Care Home Management;Cryotherapy;Electrical Stimulation;Iontophoresis 4mg /ml Dexamethasone;Moist Heat;DME Instruction;Gait training;Stair training;Functional mobility training;Therapeutic activities;Therapeutic exercise;Balance training;Neuromuscular re-education;Patient/family education;Manual techniques;Scar mobilization;Passive range of motion;Dry needling;Energy conservation;Taping;Vasopneumatic Device;Joint Manipulations    PT Next Visit Plan  L knee ROM; B LE strengthening; balance training; manual therapy for ROM and STM; modalities PRN (poor tolerance for vasopneumatic compression)    PT Home Exercise Plan  07/30/19 - incisional scar mobilization, patellar mobs, gentle self-STM to quads with rolling pin; 08/04/19 - seated HS stretch & quad sets, 3-way standing SLR with looped yellow TB    Consulted and Agree with Plan of Care  Patient       Patient will benefit from skilled therapeutic intervention in order to improve the following deficits and impairments:  Abnormal gait, Cardiopulmonary status limiting activity, Decreased activity tolerance, Decreased balance, Decreased endurance, Decreased knowledge of use of DME,  Decreased mobility, Decreased range of motion, Decreased safety awareness, Decreased scar mobility, Decreased strength, Difficulty walking, Increased edema, Increased muscle spasms, Impaired flexibility, Impaired sensation, Improper body mechanics, Postural dysfunction, Pain  Visit Diagnosis: Acute pain of left knee  Stiffness of left knee, not elsewhere classified  Other abnormalities of gait and mobility  Difficulty in walking, not elsewhere classified  Muscle weakness (generalized)  Localized edema     Problem List Patient Active Problem List   Diagnosis Date Noted  . Arthritis of left knee 06/30/2019  . Arthritis of right shoulder region 09/24/2016  . Arthritis of shoulder region, left 02/22/2015  . Type II or unspecified type diabetes mellitus without mention of complication, not stated as uncontrolled 02/11/2012  . Hypogonadism male 02/11/2012  . Arthritis of knee, right 10/30/2011    Percival Spanish, PT, MPT 08/05/2019, 11:43 AM  [facility] [address]  [place], [place], [address] Phone: [phone]   Fax:  [phone]  Name: [name] Bell MRN: [id number] Date of Birth: 03/11/1934

## 2019-08-06 ENCOUNTER — Ambulatory Visit: Payer: Medicare Other | Admitting: Physical Therapy

## 2019-08-10 ENCOUNTER — Ambulatory Visit: Payer: Medicare Other | Attending: Orthopedic Surgery

## 2019-08-10 ENCOUNTER — Other Ambulatory Visit: Payer: Self-pay

## 2019-08-10 DIAGNOSIS — R2689 Other abnormalities of gait and mobility: Secondary | ICD-10-CM

## 2019-08-10 DIAGNOSIS — R600 Localized edema: Secondary | ICD-10-CM

## 2019-08-10 DIAGNOSIS — M25662 Stiffness of left knee, not elsewhere classified: Secondary | ICD-10-CM

## 2019-08-10 DIAGNOSIS — M25562 Pain in left knee: Secondary | ICD-10-CM | POA: Diagnosis not present

## 2019-08-10 DIAGNOSIS — R262 Difficulty in walking, not elsewhere classified: Secondary | ICD-10-CM

## 2019-08-10 DIAGNOSIS — M6281 Muscle weakness (generalized): Secondary | ICD-10-CM | POA: Diagnosis present

## 2019-08-10 NOTE — Therapy (Signed)
[facility] [address]  [place], [place], [address] Phone: [phone]   Fax:  [phone]  Physical Therapy Treatment All interventions performed day of treatment session.    Patient Details  Name: [name] Bell MRN: [id number] Date of Birth: [birth year] Referring Provider (PT): Meredith Pel, MD   Encounter Date: 08/10/2019  PT End of Session - 08/10/19 1320    Visit Number  4    Number of Visits  16    Date for PT Re-Evaluation  09/10/19    Authorization Type  UHC Medicare    PT Start Time  1315    PT Stop Time  1410    PT Time Calculation (min)  55 min    Activity Tolerance  Patient tolerated treatment well    Behavior During Therapy  [facility] for tasks assessed/performed       Past Medical History:  Diagnosis Date  . Allergy    RHINITIS  . Ambulates with cane   . Anxiety   . Arthritis    osteoarthritis  . Asthmatic bronchitis    seasonal  . Cancer ([place])    skin cancer removed from back  . Depression   . Diabetes mellitus   . FHx: cardiovascular disease   . History of kidney stones   . History of pneumonia    reports d/t bronchitis  . Hx of colonic polyps   . Hypogonadism male   . 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 Pneumonia 2019  . PONV (postoperative nausea and vomiting)    reports has a difficult time coming out of ( knee surgery)  . Seborrheic keratosis     Past Surgical History:  Procedure Laterality Date  . CHOLECYSTECTOMY  2010   GALL BLADDER  . HERNIA REPAIR     during cholecystectomy  . KNEE ARTHROPLASTY  10/30/2011   Procedure: COMPUTER ASSISTED TOTAL KNEE ARTHROPLASTY;  Surgeon: Meredith Pel, MD;  Location: [facility];  Service: Orthopedics;  Laterality: Right;  right total knee arthroplasty  . KNEE CARTILAGE SURGERY  2009   Right knee  . PARTIAL GLOSSECTOMY  2017   at [facility]  . SHOULDER ARTHROSCOPY Right   . TOTAL KNEE ARTHROPLASTY Left 06/30/2019   Procedure: LEFT TOTAL  KNEE ARTHROPLASTY, CEMENTED;  Surgeon: Meredith Pel, MD;  Location: [facility];  Service: Orthopedics;  Laterality: Left;  . TOTAL SHOULDER ARTHROPLASTY Left 02/22/2015  . TOTAL SHOULDER ARTHROPLASTY Left 02/22/2015   Procedure: LEFT TOTAL SHOULDER ARTHROPLASTY;  Surgeon: Meredith Pel, MD;  Location: [facility];  Service: Orthopedics;  Laterality: Left;    There were no vitals filed for this visit.  Subjective Assessment - 08/10/19 1319    Subjective  Pt. reporting he feels well today.  Pt. noting he is walking 75% of the time with SPC.    Pertinent History  L TKA - 06/30/19; R TKA - 2013; L TSA - 2016    Patient Stated Goals  "To move the L knee & leg with as little pain as possible"    Currently in Pain?  Yes    Pain Score  3     Pain Location  Knee    Pain Orientation  Left    Pain Descriptors / Indicators  Aching    Pain Type  Surgical pain;Acute pain    Multiple Pain Sites  No  [place] Adult PT Treatment/Exercise - 08/10/19 0001      Knee/Hip Exercises: Stretches   Passive Hamstring Stretch  Left;30 seconds;2 reps    Passive Hamstring Stretch Limitations  supine with strap, 2nd rep with DF pull for calf stretch    Hip Flexor Stretch  Left;2 reps;30 seconds    Hip Flexor Stretch Limitations  mod thoms position; strap     ITB Stretch  Left;1 rep;30 seconds    ITB Stretch Limitations  supine with strap       Knee/Hip Exercises: Aerobic   Nustep  L3 x 6 min (B UE/LE)      Knee/Hip Exercises: Standing   Heel Raises  Both;20 reps;3 seconds    Heel Raises Limitations  at counter     Wall Squat  10 reps;3 seconds    Wall Squat Limitations  therapist guarding at knees for safety       Knee/Hip Exercises: Seated   Heel Slides  --    Heel Slides Limitations  --      Knee/Hip Exercises: Supine   Short Arc Quad Sets  Left;10 reps;AROM    Short Arc Quad Sets Limitations  2#    Bridges  Both;10 reps;Strengthening    Bridges Limitations  cues  to avoid     Straight Leg Raises  Left;10 reps;Strengthening    Straight Leg Raises Limitations  2# - cues for quad set prior to initiation of lift      Vasopneumatic   Number Minutes Vasopneumatic   10 minutes    Vasopnuematic Location   Knee   L   Vasopneumatic Pressure  Low    Vasopneumatic Temperature   50 dg       therex: Alternating march on foam pad at counter x 10 reps; light UE support required; close Supervision from therapist for safety        PT Short Term Goals - 08/04/19 1500      PT [place] #1   Title  Patient will be independent with initial HEP    Status  On-going    Target Date  08/20/19      PT SHORT TERM GOAL #2   Title  Patient will safely ambulate with SPC demostrating good gait pattern to increase independence with community mobility    Status  On-going    Target Date  08/20/19        PT Long Term Goals - 08/04/19 1500      PT LONG TERM GOAL #1   Title  Patient will be independent with advanced/ongoing HEP    Status  On-going    Target Date  09/10/19      PT LONG TERM GOAL #2   Title  L knee AROM >/= 2-120 dg to allow for normal gait and stair mechanics    Status  On-going    Target Date  09/10/19      PT LONG TERM GOAL #3   Title  L hip and knee strength >/= 4+/5 with pain provocation    Status  On-going    Target Date  09/10/19      PT LONG TERM GOAL #4   Title  Patient will ambulate with or without LRAD on all surfaces with good gait pattern    Status  On-going    Target Date  09/10/19      PT LONG TERM GOAL #5   Title  Patient will safely navigate full flight of stairs with single rail support  to allow safe access to basement in home    Status  On-going    Target Date  09/10/19      PT LONG TERM GOAL #6   Title  Pain level will be no more than 2/10 with all functional activities and patient will be able to sleep through the night w/o pain interference    Status  On-going    Target Date  09/10/19             Plan - 08/10/19 1818   Clinical Impression:   Pt. reporting he has been feeling better ambulating with SPC 75% of time with SPC in home and 25% of time without SPC.  Able to demo good overall understanding of proper SPC sequencing today.  Quad lag seems to be improving with SAQ and SLR activities however pt. still requiring cueing for TKE at times.  Pt. agreeable and well-motivated throughout session today.  Initiated marching on foam today with pt. requiring 1 light UE assist at counter for balance with close therapist supervision.  Ended session with ice/compression device applied to L knee to reduce post-exercise swelling and pain.  Pt. with improved tolerance for ice/compression machine today as therapist increasing temperature to ~ 50 dg F.   Personal Factors and Comorbidities  Age;Comorbidity 3+;Transportation    Comorbidities  R TKA - 2013; L TSA - 2016; OA; DM; skin and oral cancers; asthmatic bronchitis; anxiety & depression    Rehab Potential  Good    PT Treatment/Interventions  ADLs/Self Care Home Management;Cryotherapy;Electrical Stimulation;Iontophoresis 4mg /ml Dexamethasone;Moist Heat;DME Instruction;Gait training;Stair training;Functional mobility training;Therapeutic activities;Therapeutic exercise;Balance training;Neuromuscular re-education;Patient/family education;Manual techniques;Scar mobilization;Passive range of motion;Dry needling;Energy conservation;Taping;Vasopneumatic Device;Joint Manipulations    PT Next Visit Plan  L knee ROM; B LE strengthening; balance training; manual therapy for ROM and STM; modalities PRN (poor tolerance for vasopneumatic compression)    PT Home Exercise Plan  07/30/19 - incisional scar mobilization, patellar mobs, gentle self-STM to quads with rolling pin; 08/04/19 - seated HS stretch & quad sets, 3-way standing SLR with looped yellow TB    Consulted and Agree with Plan of Care  Patient       Patient will benefit from skilled  therapeutic intervention in order to improve the following deficits and impairments:  Abnormal gait, Cardiopulmonary status limiting activity, Decreased activity tolerance, Decreased balance, Decreased endurance, Decreased knowledge of use of DME, Decreased mobility, Decreased range of motion, Decreased safety awareness, Decreased scar mobility, Decreased strength, Difficulty walking, Increased edema, Increased muscle spasms, Impaired flexibility, Impaired sensation, Improper body mechanics, Postural dysfunction, Pain  Visit Diagnosis: Acute pain of left knee  Stiffness of left knee, not elsewhere classified  Other abnormalities of gait and mobility  Difficulty in walking, not elsewhere classified  Muscle weakness (generalized)  Localized edema     Problem List Patient Active Problem List   Diagnosis Date Noted  . Arthritis of left knee 06/30/2019  . Arthritis of right shoulder region 09/24/2016  . Arthritis of shoulder region, left 02/22/2015  . Type II or unspecified type diabetes mellitus without mention of complication, not stated as uncontrolled 02/11/2012  . Hypogonadism male 02/11/2012  . Arthritis of knee, right 10/30/2011    [name] Bell, [name] Bell 08/10/19 6:20 PM   [facility] [address]  [place], [place], [address] Phone: [phone]   Fax:  [phone]  Name: [name] Bell MRN: [id number] Date of Birth: [birth year]

## 2019-08-12 ENCOUNTER — Ambulatory Visit: Payer: Medicare Other | Admitting: Physical Therapy

## 2019-08-12 ENCOUNTER — Ambulatory Visit (INDEPENDENT_AMBULATORY_CARE_PROVIDER_SITE_OTHER): Payer: Medicare Other | Admitting: Orthopedic Surgery

## 2019-08-12 ENCOUNTER — Other Ambulatory Visit: Payer: Self-pay

## 2019-08-12 ENCOUNTER — Encounter: Payer: Self-pay | Admitting: Physical Therapy

## 2019-08-12 ENCOUNTER — Encounter: Payer: Self-pay | Admitting: Orthopedic Surgery

## 2019-08-12 DIAGNOSIS — M25662 Stiffness of left knee, not elsewhere classified: Secondary | ICD-10-CM

## 2019-08-12 DIAGNOSIS — M6281 Muscle weakness (generalized): Secondary | ICD-10-CM

## 2019-08-12 DIAGNOSIS — M25562 Pain in left knee: Secondary | ICD-10-CM | POA: Diagnosis not present

## 2019-08-12 DIAGNOSIS — R262 Difficulty in walking, not elsewhere classified: Secondary | ICD-10-CM

## 2019-08-12 DIAGNOSIS — R600 Localized edema: Secondary | ICD-10-CM

## 2019-08-12 DIAGNOSIS — Z96659 Presence of unspecified artificial knee joint: Secondary | ICD-10-CM

## 2019-08-12 DIAGNOSIS — R2689 Other abnormalities of gait and mobility: Secondary | ICD-10-CM

## 2019-08-12 NOTE — Progress Notes (Signed)
Post-Op Visit Note   Patient: [name] Bell           Date of Birth: 25-Mar-1934           MRN: [id number] Visit Date: 08/12/2019 PCP: Governor Specking, MD   Assessment & Plan:  Chief Complaint: No chief complaint on file.  Visit Diagnoses:  1. Status post total knee replacement, unspecified laterality     Plan: [name] Bell is a patient is now 6 weeks out left total knee replacement.  Doing well.  On exam he has range of motion 5-1 12.  Mild effusion.  1-1/2 out of 10 on pain.  CBGs intact.  Walks to [employer] twice a day.  Continue with therapy and I will see him back in 6 weeks for final check.  Follow-Up Instructions: Return in about 6 weeks (around 09/23/2019).   Orders:  No orders of the defined types were placed in this encounter.  No orders of the defined types were placed in this encounter.   Imaging: No results found.  PMFS History: Patient Active Problem List   Diagnosis Date Noted  . Arthritis of left knee 06/30/2019  . Arthritis of right shoulder region 09/24/2016  . Arthritis of shoulder region, left 02/22/2015  . Type II or unspecified type diabetes mellitus without mention of complication, not stated as uncontrolled 02/11/2012  . Hypogonadism male 02/11/2012  . Arthritis of knee, right 10/30/2011   Past Medical History:  Diagnosis Date  . Allergy    RHINITIS  . Ambulates with cane   . Anxiety   . Arthritis    osteoarthritis  . Asthmatic bronchitis    seasonal  . Cancer ([place])    skin cancer removed from back  . Depression   . Diabetes mellitus   . FHx: cardiovascular disease   . History of kidney stones   . History of pneumonia    reports d/t bronchitis  . Hx of colonic polyps   . Hypogonadism male   . 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 Pneumonia 2019  . PONV (postoperative nausea and vomiting)    reports has a difficult time coming out of ( knee surgery)  . Seborrheic keratosis     History reviewed. No pertinent family history.  Past Surgical  History:  Procedure Laterality Date  . CHOLECYSTECTOMY  2010   GALL BLADDER  . HERNIA REPAIR     during cholecystectomy  . KNEE ARTHROPLASTY  10/30/2011   Procedure: COMPUTER ASSISTED TOTAL KNEE ARTHROPLASTY;  Surgeon: Meredith Pel, MD;  Location: [facility];  Service: Orthopedics;  Laterality: Right;  right total knee arthroplasty  . KNEE CARTILAGE SURGERY  2009   Right knee  . PARTIAL GLOSSECTOMY  2017   at [facility]  . SHOULDER ARTHROSCOPY Right   . TOTAL KNEE ARTHROPLASTY Left 06/30/2019   Procedure: LEFT TOTAL KNEE ARTHROPLASTY, CEMENTED;  Surgeon: Meredith Pel, MD;  Location: [facility];  Service: Orthopedics;  Laterality: Left;  . TOTAL SHOULDER ARTHROPLASTY Left 02/22/2015  . TOTAL SHOULDER ARTHROPLASTY Left 02/22/2015   Procedure: LEFT TOTAL SHOULDER ARTHROPLASTY;  Surgeon: Meredith Pel, MD;  Location: [facility];  Service: Orthopedics;  Laterality: Left;   Social History   Occupational History  . Not on file  Tobacco Use  . Smoking status: Never Smoker  . Smokeless tobacco: Never Used  Substance and Sexual Activity  . Alcohol use: Yes    Alcohol/week: 3.0 standard drinks    Types: 3 Glasses of wine per week  Comment: occasional  . Drug use: No  . Sexual activity: Not Currently

## 2019-08-12 NOTE — Therapy (Signed)
[facility] [address]  [place], [place], [address] Phone: [phone]   Fax:  [phone]  Physical Therapy Treatment / Progress Note  Patient Details  Name: [name] Bell MRN: [id number] Date of Birth: 12/07/1933 Referring Provider (PT): Meredith Pel, MD  Progress Note  Reporting Period 07/30/2019 to 08/12/2019  See note below for Objective Data and Assessment of Progress/Goals.     Encounter Date: 08/12/2019  PT End of Session - 08/12/19 1106    Visit Number  5    Number of Visits  16    Date for PT Re-Evaluation  09/10/19    Authorization Type  [employer] Medicare    PT Start Time  1106    PT Stop Time  1200    PT Time Calculation (min)  54 min    Activity Tolerance  Patient tolerated treatment well    Behavior During Therapy  WFL for tasks assessed/performed       Past Medical History:  Diagnosis Date  . Allergy    RHINITIS  . Ambulates with cane   . Anxiety   . Arthritis    osteoarthritis  . Asthmatic bronchitis    seasonal  . Cancer ([place])    skin cancer removed from back  . Depression   . Diabetes mellitus   . FHx: cardiovascular disease   . History of kidney stones   . History of pneumonia    reports d/t bronchitis  . Hx of colonic polyps   . Hypogonadism male   . 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 Pneumonia 2019  . PONV (postoperative nausea and vomiting)    reports has a difficult time coming out of ( knee surgery)  . Seborrheic keratosis     Past Surgical History:  Procedure Laterality Date  . CHOLECYSTECTOMY  2010   GALL BLADDER  . HERNIA REPAIR     during cholecystectomy  . KNEE ARTHROPLASTY  10/30/2011   Procedure: COMPUTER ASSISTED TOTAL KNEE ARTHROPLASTY;  Surgeon: Meredith Pel, MD;  Location: [place];  Service: Orthopedics;  Laterality: Right;  right total knee arthroplasty  . KNEE CARTILAGE SURGERY  2009   Right knee  . PARTIAL GLOSSECTOMY  2017   at [facility]  .  SHOULDER ARTHROSCOPY Right   . TOTAL KNEE ARTHROPLASTY Left 06/30/2019   Procedure: LEFT TOTAL KNEE ARTHROPLASTY, CEMENTED;  Surgeon: Meredith Pel, MD;  Location: [place];  Service: Orthopedics;  Laterality: Left;  . TOTAL SHOULDER ARTHROPLASTY Left 02/22/2015  . TOTAL SHOULDER ARTHROPLASTY Left 02/22/2015   Procedure: LEFT TOTAL SHOULDER ARTHROPLASTY;  Surgeon: Meredith Pel, MD;  Location: [place];  Service: Orthopedics;  Laterality: Left;    There were no vitals filed for this visit.  Subjective Assessment - 08/12/19 1110    Subjective  Pt noting swelling seems to be going down and bending is improving allowing him to start wearing his tie shoes again. Still feels unsteady on his feet at times.    Pertinent History  L TKA - 06/30/19; R TKA - 2013; L TSA - 2016    Patient Stated Goals  "To move the L knee & leg with as little pain as possible"    Currently in Pain?  Yes    Pain Score  1    1.5/10   Pain Location  Knee    Pain Orientation  Left    Pain Type  Surgical pain;Acute pain    Pain Frequency  Constant         [facility] PT Assessment - 08/12/19 1106      Assessment   Medical Diagnosis  L TKA    Referring Provider (PT)  Meredith Pel, MD    Onset Date/Surgical Date  06/30/19    Next MD Visit  08/12/19      AROM   Left Knee Extension  2   6 dg quad lag with SLR   Left Knee Flexion  112      PROM   Left Knee Extension  0    Left Knee Flexion  116      Strength   Right Hip Flexion  5/5    Right Hip Extension  4+/5    Right Hip ABduction  4+/5    Right Hip ADduction  4+/5    Left Hip Flexion  4-/5    Left Hip Extension  4/5    Left Hip ABduction  4/5    Left Hip ADduction  4/5    Right Knee Flexion  5/5    Right Knee Extension  5/5    Left Knee Flexion  4/5    Left Knee Extension  4+/5                   [facility] Adult PT Treatment/Exercise - 08/12/19 1106      Exercises   Exercises  Knee/Hip      Knee/Hip Exercises: Aerobic   Recumbent  Bike  L1 x 6 min      Knee/Hip Exercises: Standing   Terminal Knee Extension  Left;15 reps;Theraband;Strengthening    Theraband Level (Terminal Knee Extension)  Level 4 (Blue)    Terminal Knee Extension Limitations  cues for quad & glute set to maximize knee extension - UE support on back of chair & cane    Lateral Step Up  Left;10 reps;Step Height: 6";Hand Hold: 2    Lateral Step Up Limitations  UE support on counter     Forward Step Up  Left;10 reps;Step Height: 6";Hand Hold: 2    Forward Step Up Limitations  UE support on counter & cane    SLS with Vectors  L SLS with 3-way R toe tap to cones      Knee/Hip Exercises: Seated   Other Seated Knee/Hip Exercises  L Fitter leg press (1 black/1 blue) x 15      Knee/Hip Exercises: Supine   Knee Flexion  Left;AAROM;10 reps;2 sets    Knee Flexion Limitations  HS curls with heels on peanut ball; AAROM overpressure into flexion stretch using strap      Modalities   Modalities  Vasopneumatic      Vasopneumatic   Number Minutes Vasopneumatic   10 minutes    Vasopnuematic Location   Knee   L   Vasopneumatic Pressure  Medium    Vasopneumatic Temperature   50 dg   better tolerance for increased temp              PT Short Term Goals - 08/12/19 1145      PT SHORT TERM GOAL #1   Title  Patient will be independent with initial HEP    Status  Achieved   08/12/19     PT SHORT TERM GOAL #2   Title  Patient will safely ambulate with SPC demonstrating good gait pattern to increase independence with community mobility    Status  Partially Met    Target Date  08/20/19  PT Long Term Goals - 08/04/19 1500      PT LONG TERM GOAL #1   Title  Patient will be independent with advanced/ongoing HEP    Status  On-going    Target Date  09/10/19      PT LONG TERM GOAL #2   Title  L knee AROM >/= 2-120 dg to allow for normal gait and stair mechanics    Status  On-going    Target Date  09/10/19      PT LONG TERM GOAL #3   Title  L  hip and knee strength >/= 4+/5 with pain provocation    Status  On-going    Target Date  09/10/19      PT LONG TERM GOAL #4   Title  Patient will ambulate with or without LRAD on all surfaces with good gait pattern    Status  On-going    Target Date  09/10/19      PT LONG TERM GOAL #5   Title  Patient will safely navigate full flight of stairs with single rail support to allow safe access to basement in home    Status  On-going    Target Date  09/10/19      PT LONG TERM GOAL #6   Title  Pain level will be no more than 2/10 with all functional activities and patient will be able to sleep through the night w/o pain interference    Status  On-going    Target Date  09/10/19            Plan - 08/12/19 1113    Clinical Impression Statement  [name] Bell progressing well with PT thus far. He is now ambulating more consistently with SPC but will still use RW at times when feeling unsteady. L knee ROM improving with AROM now 2-112 dg and PROM 0-116 dg. He reports good compliance with HEP. Strength improving but still limited on L vs R, and prone to fatiguing with most activities and exercises. STGs met for HEP and partially met for gait with SPC.  [name] Bell will continue to benefit from skilled PT for further ROM and strengthening as well as gait and balance training to restore functional ROM and strength with improved gait stability for decreased fall risk.    Personal Factors and Comorbidities  Age;Comorbidity 3+;Transportation    Comorbidities  R TKA - 2013; L TSA - 2016; OA; DM; skin and oral cancers; asthmatic bronchitis; anxiety & depression    Rehab Potential  Good    PT Frequency  3x / week   x 3 weeks, tapering to 2x/wk for duration of POC   PT Duration  6 weeks    PT Treatment/Interventions  ADLs/Self Care Home Management;Cryotherapy;Electrical Stimulation;Iontophoresis 26m/ml Dexamethasone;Moist Heat;DME Instruction;Gait training;Stair training;Functional mobility training;Therapeutic  activities;Therapeutic exercise;Balance training;Neuromuscular re-education;Patient/family education;Manual techniques;Scar mobilization;Passive range of motion;Dry needling;Energy conservation;Taping;Vasopneumatic Device;Joint Manipulations    PT Next Visit Plan  L knee ROM; B LE strengthening; balance training; manual therapy for ROM and STM; modalities PRN    PT Home Exercise Plan  07/30/19 - incisional scar mobilization, patellar mobs, gentle self-STM to quads with rolling pin; 08/04/19 - seated HS stretch & quad sets, 3-way standing SLR with looped yellow TB    Consulted and Agree with Plan of Care  Patient       Patient will benefit from skilled therapeutic intervention in order to improve the following deficits and impairments:  Abnormal gait, Cardiopulmonary status limiting activity, Decreased activity tolerance, Decreased balance,  Decreased endurance, Decreased knowledge of use of DME, Decreased mobility, Decreased range of motion, Decreased safety awareness, Decreased scar mobility, Decreased strength, Difficulty walking, Increased edema, Increased muscle spasms, Impaired flexibility, Impaired sensation, Improper body mechanics, Postural dysfunction, Pain  Visit Diagnosis: Acute pain of left knee  Stiffness of left knee, not elsewhere classified  Other abnormalities of gait and mobility  Difficulty in walking, not elsewhere classified  Muscle weakness (generalized)  Localized edema     Problem List Patient Active Problem List   Diagnosis Date Noted  . Arthritis of left knee 06/30/2019  . Arthritis of right shoulder region 09/24/2016  . Arthritis of shoulder region, left 02/22/2015  . Type II or unspecified type diabetes mellitus without mention of complication, not stated as uncontrolled 02/11/2012  . Hypogonadism male 02/11/2012  . Arthritis of knee, right 10/30/2011    Percival Spanish, PT, MPT 08/12/2019, 12:57 PM  [facility] [address]  [place], [place], [address] Phone: [phone]   Fax:  [phone]  Name: [name] Bell MRN: [id number] Date of Birth: Mar 03, 1934

## 2019-08-13 ENCOUNTER — Encounter: Payer: Self-pay | Admitting: Physical Therapy

## 2019-08-13 ENCOUNTER — Ambulatory Visit: Payer: Medicare Other | Admitting: Physical Therapy

## 2019-08-13 DIAGNOSIS — R600 Localized edema: Secondary | ICD-10-CM

## 2019-08-13 DIAGNOSIS — M25662 Stiffness of left knee, not elsewhere classified: Secondary | ICD-10-CM

## 2019-08-13 DIAGNOSIS — M25562 Pain in left knee: Secondary | ICD-10-CM

## 2019-08-13 DIAGNOSIS — R262 Difficulty in walking, not elsewhere classified: Secondary | ICD-10-CM

## 2019-08-13 DIAGNOSIS — R2689 Other abnormalities of gait and mobility: Secondary | ICD-10-CM

## 2019-08-13 DIAGNOSIS — M6281 Muscle weakness (generalized): Secondary | ICD-10-CM

## 2019-08-13 NOTE — Therapy (Signed)
[facility] [address]  [place], [place], [address] Phone: [phone]   Fax:  [phone]  Physical Therapy Treatment  Patient Details  Name: [name] Bell MRN: [id number] Date of Birth: 06/09/1934 Referring Provider (PT): Meredith Pel, MD   Encounter Date: 08/13/2019  PT End of Session - 08/13/19 1103    Visit Number  6    Number of Visits  16    Date for PT Re-Evaluation  09/10/19    Authorization Type  [employer] Medicare    PT Start Time  1103    PT Stop Time  1145    PT Time Calculation (min)  42 min    Equipment Utilized During Treatment  Gait belt    Activity Tolerance  Patient tolerated treatment well    Behavior During Therapy  WFL for tasks assessed/performed       Past Medical History:  Diagnosis Date  . Allergy    RHINITIS  . Ambulates with cane   . Anxiety   . Arthritis    osteoarthritis  . Asthmatic bronchitis    seasonal  . Cancer ([place])    skin cancer removed from back  . Depression   . Diabetes mellitus   . FHx: cardiovascular disease   . History of kidney stones   . History of pneumonia    reports d/t bronchitis  . Hx of colonic polyps   . Hypogonadism male   . 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 Pneumonia 2019  . PONV (postoperative nausea and vomiting)    reports has a difficult time coming out of ( knee surgery)  . Seborrheic keratosis     Past Surgical History:  Procedure Laterality Date  . CHOLECYSTECTOMY  2010   GALL BLADDER  . HERNIA REPAIR     during cholecystectomy  . KNEE ARTHROPLASTY  10/30/2011   Procedure: COMPUTER ASSISTED TOTAL KNEE ARTHROPLASTY;  Surgeon: Meredith Pel, MD;  Location: [facility];  Service: Orthopedics;  Laterality: Right;  right total knee arthroplasty  . KNEE CARTILAGE SURGERY  2009   Right knee  . PARTIAL GLOSSECTOMY  2017   at [facility]  . SHOULDER ARTHROSCOPY Right   . TOTAL KNEE ARTHROPLASTY Left 06/30/2019   Procedure: LEFT TOTAL KNEE  ARTHROPLASTY, CEMENTED;  Surgeon: Meredith Pel, MD;  Location: [place];  Service: Orthopedics;  Laterality: Left;  . TOTAL SHOULDER ARTHROPLASTY Left 02/22/2015  . TOTAL SHOULDER ARTHROPLASTY Left 02/22/2015   Procedure: LEFT TOTAL SHOULDER ARTHROPLASTY;  Surgeon: Meredith Pel, MD;  Location: [place];  Service: Orthopedics;  Laterality: Left;    There were no vitals filed for this visit.  Subjective Assessment - 08/13/19 1107    Subjective  Pt reporting MD pleased with progress - bone healing going well and ROM progressing well.    Pertinent History  L TKA - 06/30/19; R TKA - 2013; L TSA - 2016    Patient Stated Goals  "To move the L knee & leg with as little pain as possible"    Currently in Pain?  No/denies    Pain Score  0-No pain         [facility] PT Assessment - 08/13/19 1103      Assessment   Medical Diagnosis  L TKA    Referring Provider (PT)  Meredith Pel, MD    Next MD Visit  09/23/19  [facility] Adult PT Treatment/Exercise - 08/13/19 1103      Exercises   Exercises  Knee/Hip      Knee/Hip Exercises: Aerobic   Nustep  L4 x 6 min (B UE/LE)      Knee/Hip Exercises: Standing   Other Standing Knee Exercises  Alt toe clears to 9" step x 20; intermittent UE support on counter, CGA/SBA of PT          Balance Exercises - 08/13/19 1103      Balance Exercises: Standing   Standing Eyes Opened  Wide (BOA);Narrow base of support (BOS);Foam/compliant surface;Cognitive challenge   reaching for & tossing bean bags   Tandem Stance  Eyes open;Intermittent upper extremity support;30 secs;2 reps    SLS  Eyes open;Solid surface;10 secs;3 reps;Intermittent upper extremity support    Tandem Gait  Forward;2 reps;Intermittent upper extremity support   10 ft along counter   Retro Gait  2 reps;Upper extremity support   10 ft along counter with intermittent single UE support   Sidestepping  2 reps   B side stepping along counter; hands hovering over  counter   Cone Rotation  A/P;R/L;Upper extremity assist 1   SPC support   Cone Rotation Limitations  fwd & lateral cone knock-down and righting          PT Short Term Goals - 08/12/19 1145      PT SHORT TERM GOAL #1   Title  Patient will be independent with initial HEP    Status  Achieved   08/12/19     PT SHORT TERM GOAL #2   Title  Patient will safely ambulate with SPC demonstrating good gait pattern to increase independence with community mobility    Status  Partially Met    Target Date  08/20/19        PT Long Term Goals - 08/04/19 1500      PT LONG TERM GOAL #1   Title  Patient will be independent with advanced/ongoing HEP    Status  On-going    Target Date  09/10/19      PT LONG TERM GOAL #2   Title  L knee AROM >/= 2-120 dg to allow for normal gait and stair mechanics    Status  On-going    Target Date  09/10/19      PT LONG TERM GOAL #3   Title  L hip and knee strength >/= 4+/5 with pain provocation    Status  On-going    Target Date  09/10/19      PT LONG TERM GOAL #4   Title  Patient will ambulate with or without LRAD on all surfaces with good gait pattern    Status  On-going    Target Date  09/10/19      PT LONG TERM GOAL #5   Title  Patient will safely navigate full flight of stairs with single rail support to allow safe access to basement in home    Status  On-going    Target Date  09/10/19      PT LONG TERM GOAL #6   Title  Pain level will be no more than 2/10 with all functional activities and patient will be able to sleep through the night w/o pain interference    Status  On-going    Target Date  09/10/19            Plan - 08/13/19 1108    Clinical Impression Statement  [name] Bell reporting MD is pleased with  his progress and plans for him to f/u in 6 weeks for final check. Treatment session focusing on balance activities as patient reporting continued dependence on cane due to feeling of limited balance - CGA to SBA provided for all  activities with patient experiencing a few minor LOB during activities with only one significant LOB while on soft surface requiring PT assistance to prevent fall. Pain continues to improve with no pain reported on arrival and only 1/10 pain noted after completion of therapeutic activities today, therefore patient declining ice/vaso.    Personal Factors and Comorbidities  Age;Comorbidity 3+;Transportation    Comorbidities  R TKA - 2013; L TSA - 2016; OA; DM; skin and oral cancers; asthmatic bronchitis; anxiety & depression    Rehab Potential  Good    PT Frequency  3x / week   x 3 weeks, tapering to 2x/wk for duration of POC   PT Duration  6 weeks    PT Treatment/Interventions  ADLs/Self Care Home Management;Cryotherapy;Electrical Stimulation;Iontophoresis 7m/ml Dexamethasone;Moist Heat;DME Instruction;Gait training;Stair training;Functional mobility training;Therapeutic activities;Therapeutic exercise;Balance training;Neuromuscular re-education;Patient/family education;Manual techniques;Scar mobilization;Passive range of motion;Dry needling;Energy conservation;Taping;Vasopneumatic Device;Joint Manipulations    PT Next Visit Plan  L knee ROM; B LE strengthening; balance training; manual therapy for ROM and STM; modalities PRN    PT Home Exercise Plan  07/30/19 - incisional scar mobilization, patellar mobs, gentle self-STM to quads with rolling pin; 08/04/19 - seated HS stretch & quad sets, 3-way standing SLR with looped yellow TB    Consulted and Agree with Plan of Care  Patient       Patient will benefit from skilled therapeutic intervention in order to improve the following deficits and impairments:  Abnormal gait, Cardiopulmonary status limiting activity, Decreased activity tolerance, Decreased balance, Decreased endurance, Decreased knowledge of use of DME, Decreased mobility, Decreased range of motion, Decreased safety awareness, Decreased scar mobility, Decreased strength, Difficulty walking,  Increased edema, Increased muscle spasms, Impaired flexibility, Impaired sensation, Improper body mechanics, Postural dysfunction, Pain  Visit Diagnosis: Acute pain of left knee  Stiffness of left knee, not elsewhere classified  Other abnormalities of gait and mobility  Difficulty in walking, not elsewhere classified  Muscle weakness (generalized)  Localized edema     Problem List Patient Active Problem List   Diagnosis Date Noted  . Arthritis of left knee 06/30/2019  . Arthritis of right shoulder region 09/24/2016  . Arthritis of shoulder region, left 02/22/2015  . Type II or unspecified type diabetes mellitus without mention of complication, not stated as uncontrolled 02/11/2012  . Hypogonadism male 02/11/2012  . Arthritis of knee, right 10/30/2011    JPercival Spanish PT, MPT 08/13/2019, 12:18 PM  [facility] [place] [address]Phone: [phone]  Fax:  [phone] Name: [name] ACEBOMRN: [id number] of Birth: 1Nov 10, 1935

## 2019-08-17 ENCOUNTER — Ambulatory Visit: Payer: Medicare Other | Admitting: Physical Therapy

## 2019-08-17 ENCOUNTER — Encounter: Payer: Self-pay | Admitting: Physical Therapy

## 2019-08-17 ENCOUNTER — Other Ambulatory Visit: Payer: Self-pay

## 2019-08-17 DIAGNOSIS — R600 Localized edema: Secondary | ICD-10-CM

## 2019-08-17 DIAGNOSIS — M6281 Muscle weakness (generalized): Secondary | ICD-10-CM

## 2019-08-17 DIAGNOSIS — M25562 Pain in left knee: Secondary | ICD-10-CM

## 2019-08-17 DIAGNOSIS — R2689 Other abnormalities of gait and mobility: Secondary | ICD-10-CM

## 2019-08-17 DIAGNOSIS — R262 Difficulty in walking, not elsewhere classified: Secondary | ICD-10-CM

## 2019-08-17 DIAGNOSIS — M25662 Stiffness of left knee, not elsewhere classified: Secondary | ICD-10-CM

## 2019-08-17 NOTE — Therapy (Addendum)
[facility] [address]  [place], [place], [address] Phone: [phone]   Fax:  [phone]  Physical Therapy Treatment  Patient Details  Name: [name] Bell MRN: [id number] Date of Birth: 10-28-1933 Referring Provider (PT): Meredith Pel, MD   Encounter Date: 08/17/2019  PT End of Session - 08/17/19 1313    Visit Number  7    Number of Visits  16    Date for PT Re-Evaluation  09/10/19    Authorization Type  [employer] Medicare    PT Start Time  8676    PT Stop Time  1357    PT Time Calculation (min)  44 min    Activity Tolerance  Patient tolerated treatment well    Behavior During Therapy  [facility] for tasks assessed/performed       Past Medical History:  Diagnosis Date  . Allergy    RHINITIS  . Ambulates with cane   . Anxiety   . Arthritis    osteoarthritis  . Asthmatic bronchitis    seasonal  . Cancer ([place])    skin cancer removed from back  . Depression   . Diabetes mellitus   . FHx: cardiovascular disease   . History of kidney stones   . History of pneumonia    reports d/t bronchitis  . Hx of colonic polyps   . Hypogonadism male   . 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 Pneumonia 2019  . PONV (postoperative nausea and vomiting)    reports has a difficult time coming out of ( knee surgery)  . Seborrheic keratosis     Past Surgical History:  Procedure Laterality Date  . CHOLECYSTECTOMY  2010   GALL BLADDER  . HERNIA REPAIR     during cholecystectomy  . KNEE ARTHROPLASTY  10/30/2011   Procedure: COMPUTER ASSISTED TOTAL KNEE ARTHROPLASTY;  Surgeon: Meredith Pel, MD;  Location: [place];  Service: Orthopedics;  Laterality: Right;  right total knee arthroplasty  . KNEE CARTILAGE SURGERY  2009   Right knee  . PARTIAL GLOSSECTOMY  2017   at [facility]  . SHOULDER ARTHROSCOPY Right   . TOTAL KNEE ARTHROPLASTY Left 06/30/2019   Procedure: LEFT TOTAL KNEE ARTHROPLASTY, CEMENTED;  Surgeon: Meredith Pel, MD;  Location: [place];  Service: Orthopedics;  Laterality: Left;  . TOTAL SHOULDER ARTHROPLASTY Left 02/22/2015  . TOTAL SHOULDER ARTHROPLASTY Left 02/22/2015   Procedure: LEFT TOTAL SHOULDER ARTHROPLASTY;  Surgeon: Meredith Pel, MD;  Location: [place] [facility];  Service: Orthopedics;  Laterality: Left;    There were no vitals filed for this visit.  Subjective Assessment - 08/17/19 1316    Subjective  Pt reporting pain very mild today -localized to either side of the patella.    Pertinent History  L TKA - 06/30/19; R TKA - 2013; L TSA - 2016    Patient Stated Goals  "To move the L knee & leg with as little pain as possible"    Currently in Pain?  Yes    Pain Score  1     Pain Location  Knee    Pain Orientation  Left;Anterior    Pain Type  Surgical pain;Acute pain    Pain Frequency  Intermittent                       [facility] Adult PT Treatment/Exercise - 08/17/19 1313      Ambulation/Gait   Ambulation/Gait  Yes  Ambulation/Gait Assistance  6: Modified independent (Device/Increase time)    Ambulation Distance (Feet)  90 Feet   x 2, with & w/o SPC   Assistive device  Straight cane;None    Gait Pattern  Wide base of support;Step-through pattern    Ambulation Surface  Level;Indoor    Stairs  Yes    Stairs Assistance  5: Supervision    Stair Management Technique  One rail Left;Alternating pattern;With cane   pt carrying cane more than using it   Number of Stairs  14    Height of Stairs  7      Exercises   Exercises  Knee/Hip      Knee/Hip Exercises: Stretches   Knee: Self-Stretch to increase Flexion  Right;5 reps;10 seconds    Knee: Self-Stretch Limitations  step stretch      Knee/Hip Exercises: Aerobic   Recumbent Bike  L1 x 6 min      Knee/Hip Exercises: Standing   Hip Flexion  Right;Left;10 reps;Knee straight;Stengthening    Hip Flexion Limitations  looped yellow TB at ankles; cues to slow pace     Forward Lunges  Right;Left;10 reps;3 seconds    Forward  Lunges Limitations  cues to avoid knee fwd of toes; UE support on back of chair    Terminal Knee Extension  Left;15 reps;Theraband;Strengthening    Theraband Level (Terminal Knee Extension)  Level 4 (Blue)    Terminal Knee Extension Limitations  cues for quad & glute set to maximize knee extension - UE support on back of chair    Hip Abduction  Left;Right;10 reps;Knee straight;Stengthening    Abduction Limitations  looped yellow TB at ankles; cues to slow pace     Hip Extension  Right;Left;10 reps;Knee straight;Stengthening    Extension Limitations  looped yellow TB at ankles; cues to slow pace       Manual Therapy   Manual Therapy  Joint mobilization;Soft tissue mobilization    Joint Mobilization  L knee patellar mobs - still slight restricted sup/inf, therefore reviewed self-mobilization for home    Soft tissue mobilization  L knee incisional scar mobilization - good scar mobility             PT Education - 08/17/19 1357    Education Details  HEP update - Step stretch for knee flexion    Person(s) Educated  Patient    Methods  Explanation;Demonstration;Handout    Comprehension  Verbalized understanding;Returned demonstration       PT Short Term Goals - 08/17/19 1319      PT SHORT TERM GOAL #1   Title  Patient will be independent with initial HEP    Status  Achieved   08/12/19     PT SHORT TERM GOAL #2   Title  Patient will safely ambulate with SPC demonstrating good gait pattern to increase independence with community mobility    Status  Achieved   08/17/19   Target Date  --        PT Long Term Goals - 08/04/19 1500      PT LONG TERM GOAL #1   Title  Patient will be independent with advanced/ongoing HEP    Status  On-going    Target Date  09/10/19      PT LONG TERM GOAL #2   Title  L knee AROM >/= 2-120 dg to allow for normal gait and stair mechanics    Status  On-going    Target Date  09/10/19  PT LONG TERM GOAL #3   Title  L hip and knee strength >/=  4+/5 with pain provocation    Status  On-going    Target Date  09/10/19      PT LONG TERM GOAL #4   Title  Patient will ambulate with or without LRAD on all surfaces with good gait pattern    Status  On-going    Target Date  09/10/19      PT LONG TERM GOAL #5   Title  Patient will safely navigate full flight of stairs with single rail support to allow safe access to basement in home    Status  On-going    Target Date  09/10/19      PT LONG TERM GOAL #6   Title  Pain level will be no more than 2/10 with all functional activities and patient will be able to sleep through the night w/o pain interference    Status  On-going    Target Date  09/10/19            Plan - 08/17/19 1318    Clinical Impression Statement  [name] Bell noting continued improvement with decreasing pain, decreased sleep disturbance and increasing activity tolerance. Now able to sleep on R side (preferred sleeping position) w/o pain interference. He is now consistently walking with SPC in place of RW and reports sometimes walking w/o cane in home - mildly wide BOS but otherwise good step-through gait pattern with adequate hip and knee flexion and good heel-toe progression. All STGs now met. He mentioned planning to start trying to take wet laundry from main floor of home to clothes dryer in the basement, therefore initiated stair training today with patient able to ascend and descend stairs reciprocally with single handrail support - increased effort noted on ascent with limited eccentric control on descent, however no instability observed.    Personal Factors and Comorbidities  Age;Comorbidity 3+;Transportation    Comorbidities  R TKA - 2013; L TSA - 2016; OA; DM; skin and oral cancers; asthmatic bronchitis; anxiety & depression    Rehab Potential  Good    PT Frequency  3x / week   x 3 weeks, tapering to 2x/wk for duration of POC   PT Duration  6 weeks    PT Treatment/Interventions  ADLs/Self Care Home  Management;Cryotherapy;Electrical Stimulation;Iontophoresis 7m/ml Dexamethasone;Moist Heat;DME Instruction;Gait training;Stair training;Functional mobility training;Therapeutic activities;Therapeutic exercise;Balance training;Neuromuscular re-education;Patient/family education;Manual techniques;Scar mobilization;Passive range of motion;Dry needling;Energy conservation;Taping;Vasopneumatic Device;Joint Manipulations    PT Next Visit Plan  L knee ROM; B LE strengthening; balance training; manual therapy for ROM and STM; modalities PRN    PT Home Exercise Plan  07/30/19 - incisional scar mobilization, patellar mobs, gentle self-STM to quads with rolling pin; 08/04/19 - seated HS stretch & quad sets, 3-way standing SLR with looped yellow TB; 08/17/19 - step stretch for knee flexion    Consulted and Agree with Plan of Care  Patient       Patient will benefit from skilled therapeutic intervention in order to improve the following deficits and impairments:  Abnormal gait, Cardiopulmonary status limiting activity, Decreased activity tolerance, Decreased balance, Decreased endurance, Decreased knowledge of use of DME, Decreased mobility, Decreased range of motion, Decreased safety awareness, Decreased scar mobility, Decreased strength, Difficulty walking, Increased edema, Increased muscle spasms, Impaired flexibility, Impaired sensation, Improper body mechanics, Postural dysfunction, Pain  Visit Diagnosis: Acute pain of left knee  Stiffness of left knee, not elsewhere classified  Other abnormalities of gait and mobility  Difficulty in walking, not elsewhere classified  Muscle weakness (generalized)  Localized edema     Problem List Patient Active Problem List   Diagnosis Date Noted  . Arthritis of left knee 06/30/2019  . Arthritis of right shoulder region 09/24/2016  . Arthritis of shoulder region, left 02/22/2015  . Type II or unspecified type diabetes mellitus without mention of complication,  not stated as uncontrolled 02/11/2012  . Hypogonadism male 02/11/2012  . Arthritis of knee, right 10/30/2011    Percival Spanish, PT, MPT 08/17/2019, 4:57 PM  [facility] [address]  [place], [place], [address] Phone: [phone]   Fax:  [phone]  Name: [name] Bell MRN: [id number] Date of Birth: [birth year]

## 2019-08-19 ENCOUNTER — Encounter: Payer: Self-pay | Admitting: Physical Therapy

## 2019-08-19 ENCOUNTER — Other Ambulatory Visit: Payer: Self-pay

## 2019-08-19 ENCOUNTER — Ambulatory Visit: Payer: Medicare Other | Admitting: Physical Therapy

## 2019-08-19 DIAGNOSIS — M25562 Pain in left knee: Secondary | ICD-10-CM

## 2019-08-19 DIAGNOSIS — R262 Difficulty in walking, not elsewhere classified: Secondary | ICD-10-CM

## 2019-08-19 DIAGNOSIS — R2689 Other abnormalities of gait and mobility: Secondary | ICD-10-CM

## 2019-08-19 DIAGNOSIS — M6281 Muscle weakness (generalized): Secondary | ICD-10-CM

## 2019-08-19 DIAGNOSIS — M25662 Stiffness of left knee, not elsewhere classified: Secondary | ICD-10-CM

## 2019-08-19 DIAGNOSIS — R600 Localized edema: Secondary | ICD-10-CM

## 2019-08-19 NOTE — Therapy (Signed)
[facility] [address]  [place], [place], [address] Phone: [phone]   Fax:  [phone]  Physical Therapy Treatment  Patient Details  Name: [name] Bell MRN: [id number] Date of Birth: [birth year] Referring Provider (PT): Meredith Pel, MD   Encounter Date: 08/19/2019  PT End of Session - 08/19/19 1145    Visit Number  8    Number of Visits  16    Date for PT Re-Evaluation  09/10/19    Authorization Type  [employer]HC Medicare    PT Start Time  0349    PT Stop Time  1231    PT Time Calculation ([name])  46 [name]    Equipment Utilized During Treatment  Gait belt    Activity Tolerance  Patient tolerated treatment well    Behavior During Therapy  [facility] for tasks assessed/performed       Past Medical History:  Diagnosis Date  . Allergy    RHINITIS  . Ambulates with cane   . Anxiety   . Arthritis    osteoarthritis  . Asthmatic bronchitis    seasonal  . Cancer ([place])    skin cancer removed from back  . Depression   . Diabetes mellitus   . FHx: cardiovascular disease   . History of kidney stones   . History of pneumonia    reports d/t bronchitis  . Hx of colonic polyps   . Hypogonadism male   . 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 Pneumonia 2019  . PONV (postoperative nausea and vomiting)    reports has a difficult time coming out of ( knee surgery)  . Seborrheic keratosis     Past Surgical History:  Procedure Laterality Date  . CHOLECYSTECTOMY  2010   GALL BLADDER  . HERNIA REPAIR     during cholecystectomy  . KNEE ARTHROPLASTY  10/30/2011   Procedure: COMPUTER ASSISTED TOTAL KNEE ARTHROPLASTY;  Surgeon: Meredith Pel, MD;  Location: [facility];  Service: Orthopedics;  Laterality: Right;  right total knee arthroplasty  . KNEE CARTILAGE SURGERY  2009   Right knee  . PARTIAL GLOSSECTOMY  2017   at [facility]  . SHOULDER ARTHROSCOPY Right   . TOTAL KNEE ARTHROPLASTY Left 06/30/2019   Procedure: LEFT TOTAL KNEE  ARTHROPLASTY, CEMENTED;  Surgeon: Meredith Pel, MD;  Location: [facility];  Service: Orthopedics;  Laterality: Left;  . TOTAL SHOULDER ARTHROPLASTY Left 02/22/2015  . TOTAL SHOULDER ARTHROPLASTY Left 02/22/2015   Procedure: LEFT TOTAL SHOULDER ARTHROPLASTY;  Surgeon: Meredith Pel, MD;  Location: [facility];  Service: Orthopedics;  Laterality: Left;    There were no vitals filed for this visit.  Subjective Assessment - 08/19/19 1148    Subjective  Pt arriving today w/o cane, reporting that he was in a rush and forgot it. Notes increased thigh muscle soreness after last session that is now resolved.    Pertinent History  L TKA - 06/30/19; R TKA - 2013; L TSA - 2016    Patient Stated Goals  "To move the L knee & leg with as little pain as possible"    Currently in Pain?  No/denies    Pain Score  0-No pain                       [facility] Adult PT Treatment/Exercise - 08/19/19 1145      Exercises   Exercises  Knee/Hip      Knee/Hip Exercises:  Aerobic   Nustep  L4 x 6 [name] (B UE/LE)      Knee/Hip Exercises: Machines for Strengthening   Cybex Knee Extension  B LE 10# x 10, B con/L ecc 10# x 10    Cybex Knee Flexion  B LE 15# x 10, B con/L ecc 10# x 10    Cybex Leg Press  B LE 15# x 10, 20# x 15      Knee/Hip Exercises: Standing   SLS with Vectors  L SLS with 3-way R toe tap to balance pebbles x 6, 1 pole A + CGA of PT          Balance Exercises - 08/19/19 1221      Balance Exercises: Standing   Balance Beam  tandem gait x 4 passes (2 with SPC & 2 w/o AD), B sidestepping x 4 passes - CGA of PT for all attempts    Cone Rotation  A/P;R/L;Upper extremity assist 1   SPC support   Cone Rotation Limitations  fwd & lateral cone knock-down and righting        PT Education - 08/19/19 1231    Education Details  HEP update - balance activities with countertop support    Person(s) Educated  Patient    Methods  Explanation;Demonstration;Handout    Comprehension  Verbalized  understanding       PT Short Term Goals - 08/17/19 1319      PT SHORT TERM GOAL #1   Title  Patient will be independent with initial HEP    Status  Achieved   08/12/19     PT SHORT TERM GOAL #2   Title  Patient will safely ambulate with SPC demonstrating good gait pattern to increase independence with community mobility    Status  Achieved   08/17/19   Target Date  --        PT Long Term Goals - 08/19/19 1231      PT LONG TERM GOAL #1   Title  Patient will be independent with advanced/ongoing HEP    Status  Partially Met      PT LONG TERM GOAL #2   Title  L knee AROM >/= 2-120 dg to allow for normal gait and stair mechanics    Status  On-going      PT LONG TERM GOAL #3   Title  L hip and knee strength >/= 4+/5 with pain provocation    Status  On-going      PT LONG TERM GOAL #4   Title  Patient will ambulate with or without LRAD on all surfaces with good gait pattern    Status  Partially Met      PT LONG TERM GOAL #5   Title  Patient will safely navigate full flight of stairs with single rail support to allow safe access to basement in home    Status  Partially Met      PT LONG TERM GOAL #6   Title  Pain level will be no more than 2/10 with all functional activities and patient will be able to sleep through the night w/o pain interference    Status  Partially Met            Plan - 08/19/19 1150    Clinical Impression Statement  [name] reporting increased thigh muscle soreness following last therapy session that has now resolved with no pain reported today. He arrived to PT w/o cane today having forgotten it at home but reports no  issues walking in from the parking lot. Patient reporting he has already done most of his HEP earlier this morning, therefore therapy session focusing on introduction of machine strengthening as well as progression of balance training. Patient inquiring about ways to work on balance at home therefore provided instruction in HEP balance  activities using countertop support as needed.  Patient progressing well toward goals with several LTGs now partially met.    Personal Factors and Comorbidities  Age;Comorbidity 3+;Transportation    Comorbidities  R TKA - 2013; L TSA - 2016; OA; DM; skin and oral cancers; asthmatic bronchitis; anxiety & depression    Rehab Potential  Good    PT Frequency  3x / week   x 3 weeks, tapering to 2x/wk for duration of POC   PT Duration  6 weeks    PT Treatment/Interventions  ADLs/Self Care Home Management;Cryotherapy;Electrical Stimulation;Iontophoresis 26m/ml Dexamethasone;Moist Heat;DME Instruction;Gait training;Stair training;Functional mobility training;Therapeutic activities;Therapeutic exercise;Balance training;Neuromuscular re-education;Patient/family education;Manual techniques;Scar mobilization;Passive range of motion;Dry needling;Energy conservation;Taping;Vasopneumatic Device;Joint Manipulations    PT Next Visit Plan  L knee ROM; B LE strengthening; balance training; manual therapy for ROM and STM; modalities PRN    PT Home Exercise Plan  07/30/19 - incisional scar mobilization, patellar mobs, gentle self-STM to quads with rolling pin; 08/04/19 - seated HS stretch & quad sets, 3-way standing SLR with looped yellow TB; 08/17/19 - step stretch for knee flexion; 08/19/19 - balance activities: SLS, tandem stance & tandem gait with counter support    Consulted and Agree with Plan of Care  Patient       Patient will benefit from skilled therapeutic intervention in order to improve the following deficits and impairments:  Abnormal gait, Cardiopulmonary status limiting activity, Decreased activity tolerance, Decreased balance, Decreased endurance, Decreased knowledge of use of DME, Decreased mobility, Decreased range of motion, Decreased safety awareness, Decreased scar mobility, Decreased strength, Difficulty walking, Increased edema, Increased muscle spasms, Impaired flexibility, Impaired sensation,  Improper body mechanics, Postural dysfunction, Pain  Visit Diagnosis: Acute pain of left knee  Stiffness of left knee, not elsewhere classified  Other abnormalities of gait and mobility  Difficulty in walking, not elsewhere classified  Muscle weakness (generalized)  Localized edema     Problem List Patient Active Problem List   Diagnosis Date Noted  . Arthritis of left knee 06/30/2019  . Arthritis of right shoulder region 09/24/2016  . Arthritis of shoulder region, left 02/22/2015  . Type II or unspecified type diabetes mellitus without mention of complication, not stated as uncontrolled 02/11/2012  . Hypogonadism male 02/11/2012  . Arthritis of knee, right 10/30/2011    JPercival Spanish PT, MPT 08/19/2019, 12:50 PM  [facility] [place] [address]Phone: [phone]  Fax:  [phone] Name: LABDALLA NARAMOREMRN: [id number] of Birth: [birth year]

## 2019-08-19 NOTE — Patient Instructions (Signed)
    Home exercise program created by JoAnne Kreis, PT.  For questions, please contact JoAnne via phone at [phone] or email at [email or web address]  [facility] [address]  [address] [place], [place], [address] Phone: [phone]   Fax:  [phone]    

## 2019-08-21 ENCOUNTER — Ambulatory Visit: Payer: Medicare Other | Admitting: Physical Therapy

## 2019-08-21 ENCOUNTER — Other Ambulatory Visit: Payer: Self-pay

## 2019-08-21 DIAGNOSIS — M25662 Stiffness of left knee, not elsewhere classified: Secondary | ICD-10-CM

## 2019-08-21 DIAGNOSIS — R262 Difficulty in walking, not elsewhere classified: Secondary | ICD-10-CM

## 2019-08-21 DIAGNOSIS — M6281 Muscle weakness (generalized): Secondary | ICD-10-CM

## 2019-08-21 DIAGNOSIS — M25562 Pain in left knee: Secondary | ICD-10-CM | POA: Diagnosis not present

## 2019-08-21 DIAGNOSIS — R2689 Other abnormalities of gait and mobility: Secondary | ICD-10-CM

## 2019-08-21 DIAGNOSIS — R600 Localized edema: Secondary | ICD-10-CM

## 2019-08-21 NOTE — Therapy (Signed)
[facility] [address]  [place], [place], [address] Phone: [phone]   Fax:  [phone]  Physical Therapy Treatment  Patient Details  Name: [name] Bell MRN: [id number] Date of Birth: 08-Jan-1934 Referring Provider (PT): Meredith Pel, MD   Encounter Date: 08/21/2019  PT End of Session - 08/21/19 1104    Visit Number  9    Number of Visits  16    Date for PT Re-Evaluation  09/10/19    Authorization Type  [employer] Medicare    PT Start Time  1104    PT Stop Time  1144    PT Time Calculation (min)  40 min    Equipment Utilized During Treatment  Gait belt    Activity Tolerance  Patient tolerated treatment well    Behavior During Therapy  [facility] for tasks assessed/performed       Past Medical History:  Diagnosis Date  . Allergy    RHINITIS  . Ambulates with cane   . Anxiety   . Arthritis    osteoarthritis  . Asthmatic bronchitis    seasonal  . Cancer ([place])    skin cancer removed from back  . Depression   . Diabetes mellitus   . FHx: cardiovascular disease   . History of kidney stones   . History of pneumonia    reports d/t bronchitis  . Hx of colonic polyps   . Hypogonadism male   . 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 Pneumonia 2019  . PONV (postoperative nausea and vomiting)    reports has a difficult time coming out of ( knee surgery)  . Seborrheic keratosis     Past Surgical History:  Procedure Laterality Date  . CHOLECYSTECTOMY  2010   GALL BLADDER  . HERNIA REPAIR     during cholecystectomy  . KNEE ARTHROPLASTY  10/30/2011   Procedure: COMPUTER ASSISTED TOTAL KNEE ARTHROPLASTY;  Surgeon: Meredith Pel, MD;  Location: [place];  Service: Orthopedics;  Laterality: Right;  right total knee arthroplasty  . KNEE CARTILAGE SURGERY  2009   Right knee  . PARTIAL GLOSSECTOMY  2017   at [facility]  . SHOULDER ARTHROSCOPY Right   . TOTAL KNEE ARTHROPLASTY Left 06/30/2019   Procedure: LEFT TOTAL KNEE  ARTHROPLASTY, CEMENTED;  Surgeon: Meredith Pel, MD;  Location: [place];  Service: Orthopedics;  Laterality: Left;  . TOTAL SHOULDER ARTHROPLASTY Left 02/22/2015  . TOTAL SHOULDER ARTHROPLASTY Left 02/22/2015   Procedure: LEFT TOTAL SHOULDER ARTHROPLASTY;  Surgeon: Meredith Pel, MD;  Location: [place];  Service: Orthopedics;  Laterality: Left;    There were no vitals filed for this visit.  Subjective Assessment - 08/21/19 1106    Subjective  Pt having an ordinary day, not good, not bad.    Pertinent History  L TKA - 06/30/19; R TKA - 2013; L TSA - 2016    Patient Stated Goals  "To move the L knee & leg with as little pain as possible"    Currently in Pain?  No/denies         [facility] PT Assessment - 08/21/19 1104      Assessment   Medical Diagnosis  L TKA    Referring Provider (PT)  Meredith Pel, MD    Onset Date/Surgical Date  06/30/19    Next MD Visit  09/23/19      AROM   Left Knee Extension  0    Left Knee Flexion  120      PROM   Left Knee Extension  0    Left Knee Flexion  122                   [facility] Adult PT Treatment/Exercise - 08/21/19 1104      Exercises   Exercises  Knee/Hip      Knee/Hip Exercises: Aerobic   Nustep  L4 x 6 min (B UE/LE)      Knee/Hip Exercises: Standing   Lateral Step Up  Left;Right;10 reps;Step Height: 8";Hand Hold: 2    Lateral Step Up Limitations  UE support on TM rail    Forward Step Up  Left;10 reps;Step Height: 8";Hand Hold: 1    Forward Step Up Limitations  UE support on TM rail    Step Down  Left;10 reps;2 sets;Step Height: 6";Step Height: 4";Hand Hold: 2    Step Down Limitations  eccentric lowering with light heel touch lateral (6") & toe touch fwd (4")    Functional Squat  10 reps;3 seconds    Functional Squat Limitations  triple extension - UE support on TM rail    Other Standing Knee Exercises  Alt toe clears from Airex pad to 9" step x 20; single UE support on TM rail, SBA/close supervison of PT     Other Standing Knee Exercises  B side-stepping & fwd back monster walk with looped yellow TB at ankles 2-3 step along TM rail x 10               PT Short Term Goals - 08/17/19 1319      PT SHORT TERM GOAL #1   Title  Patient will be independent with initial HEP    Status  Achieved   08/12/19     PT SHORT TERM GOAL #2   Title  Patient will safely ambulate with SPC demonstrating good gait pattern to increase independence with community mobility    Status  Achieved   08/17/19   Target Date  --        PT Long Term Goals - 08/21/19 1144      PT LONG TERM GOAL #1   Title  Patient will be independent with advanced/ongoing HEP    Status  Partially Met      PT LONG TERM GOAL #2   Title  L knee AROM >/= 2-120 dg to allow for normal gait and stair mechanics    Status  Achieved   08/21/19     PT LONG TERM GOAL #3   Title  L hip and knee strength >/= 4+/5 with pain provocation    Status  On-going      PT LONG TERM GOAL #4   Title  Patient will ambulate with or without LRAD on all surfaces with good gait pattern    Status  Partially Met      PT LONG TERM GOAL #5   Title  Patient will safely navigate full flight of stairs with single rail support to allow safe access to basement in home    Status  Partially Met      PT LONG TERM GOAL #6   Title  Pain level will be no more than 2/10 with all functional activities and patient will be able to sleep through the night w/o pain interference    Status  Partially Met            Plan - 08/21/19 1107    Clinical Impression Statement  [name]  reporting he has been able to carry his wet laundry from the washer on the main floor to the dryer in the basement this week w/o difficulty but does feel his knee is still at it weakest with eccentric lowering when descending stairs. He states that this weakness is why he still prefers to use the cane with ambulation. Continued LE strengthening with eccentric and functional emphasis as well as  balance and coordination activities to improve stability and decrease fall risk. L knee ROM continues to improve - AROM now 0-120 dg with PROM 0-122 dg - LTG #2 met. Will plan for strength reassessment as part of 10th visit assessment on next treatment session. Patient declining need for ice/vaso at end of visit today.    Personal Factors and Comorbidities  Age;Comorbidity 3+;Transportation    Comorbidities  R TKA - 2013; L TSA - 2016; OA; DM; skin and oral cancers; asthmatic bronchitis; anxiety & depression    Rehab Potential  Good    PT Frequency  3x / week   x 3 weeks, tapering to 2x/wk for duration of POC   PT Duration  6 weeks    PT Treatment/Interventions  ADLs/Self Care Home Management;Cryotherapy;Electrical Stimulation;Iontophoresis 66m/ml Dexamethasone;Moist Heat;DME Instruction;Gait training;Stair training;Functional mobility training;Therapeutic activities;Therapeutic exercise;Balance training;Neuromuscular re-education;Patient/family education;Manual techniques;Scar mobilization;Passive range of motion;Dry needling;Energy conservation;Taping;Vasopneumatic Device;Joint Manipulations    PT Next Visit Plan  10th visit PN; L knee ROM; B LE strengthening; balance training; manual therapy for ROM and STM; modalities PRN    PT Home Exercise Plan  07/30/19 - incisional scar mobilization, patellar mobs, gentle self-STM to quads with rolling pin; 08/04/19 - seated HS stretch & quad sets, 3-way standing SLR with looped yellow TB; 08/17/19 - step stretch for knee flexion; 08/19/19 - balance activities: SLS, tandem stance & tandem gait with counter support    Consulted and Agree with Plan of Care  Patient       Patient will benefit from skilled therapeutic intervention in order to improve the following deficits and impairments:  Abnormal gait, Cardiopulmonary status limiting activity, Decreased activity tolerance, Decreased balance, Decreased endurance, Decreased knowledge of use of DME, Decreased  mobility, Decreased range of motion, Decreased safety awareness, Decreased scar mobility, Decreased strength, Difficulty walking, Increased edema, Increased muscle spasms, Impaired flexibility, Impaired sensation, Improper body mechanics, Postural dysfunction, Pain  Visit Diagnosis: Acute pain of left knee  Stiffness of left knee, not elsewhere classified  Other abnormalities of gait and mobility  Difficulty in walking, not elsewhere classified  Muscle weakness (generalized)  Localized edema     Problem List Patient Active Problem List   Diagnosis Date Noted  . Arthritis of left knee 06/30/2019  . Arthritis of right shoulder region 09/24/2016  . Arthritis of shoulder region, left 02/22/2015  . Type II or unspecified type diabetes mellitus without mention of complication, not stated as uncontrolled 02/11/2012  . Hypogonadism male 02/11/2012  . Arthritis of knee, right 10/30/2011    JPercival Spanish PT, MPT 08/21/2019, 12:01 PM  [facility] [address]Phone: [phone]  Fax:  [phone] Name: LSUDAIS BANGHARTMRN: [id number] of Birth: [birth year]

## 2019-08-26 ENCOUNTER — Other Ambulatory Visit: Payer: Self-pay

## 2019-08-26 ENCOUNTER — Ambulatory Visit: Payer: Medicare Other | Admitting: Physical Therapy

## 2019-08-26 ENCOUNTER — Encounter: Payer: Self-pay | Admitting: Physical Therapy

## 2019-08-26 DIAGNOSIS — R2689 Other abnormalities of gait and mobility: Secondary | ICD-10-CM

## 2019-08-26 DIAGNOSIS — M6281 Muscle weakness (generalized): Secondary | ICD-10-CM

## 2019-08-26 DIAGNOSIS — R600 Localized edema: Secondary | ICD-10-CM

## 2019-08-26 DIAGNOSIS — R262 Difficulty in walking, not elsewhere classified: Secondary | ICD-10-CM

## 2019-08-26 DIAGNOSIS — M25562 Pain in left knee: Secondary | ICD-10-CM

## 2019-08-26 DIAGNOSIS — M25662 Stiffness of left knee, not elsewhere classified: Secondary | ICD-10-CM

## 2019-08-26 NOTE — Therapy (Addendum)
[facility] [address]  [place], [place], [address] Phone: [phone]   Fax:  [phone]  Physical Therapy Treatment / Progress Note  Patient Details  Name: [name] Bell MRN: [id number] Date of Birth: 01-01-1934 Referring Provider ([name] Bell): Meredith Pel, MD  Progress Note  Reporting Period 07/30/2019 to 08/26/2019  See note below for Objective Data and Assessment of Progress/Goals.     Encounter Date: 08/26/2019  [name] Bell End of Session - 08/26/19 1104    Visit Number  10    Number of Visits  16    Date for [name] Bell Re-Evaluation  09/10/19    Authorization Type  [employer]HC Medicare    [name] Bell Start Time  1104    [name] Bell Stop Time  1151    [name] Bell Time Calculation (min)  47 min    Equipment Utilized During Treatment  Gait belt    Activity Tolerance  Patient tolerated treatment well    Behavior During Therapy  WFL for tasks assessed/performed       Past Medical History:  Diagnosis Date  . Allergy    RHINITIS  . Ambulates with cane   . Anxiety   . Arthritis    osteoarthritis  . Asthmatic bronchitis    seasonal  . Cancer ([place])    skin cancer removed from back  . Depression   . Diabetes mellitus   . FHx: cardiovascular disease   . History of kidney stones   . History of pneumonia    reports d/t bronchitis  . Hx of colonic polyps   . Hypogonadism male   . 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 Pneumonia 2019  . PONV (postoperative nausea and vomiting)    reports has a difficult time coming out of ( knee surgery)  . Seborrheic keratosis     Past Surgical History:  Procedure Laterality Date  . CHOLECYSTECTOMY  2010   GALL BLADDER  . HERNIA REPAIR     during cholecystectomy  . KNEE ARTHROPLASTY  10/30/2011   Procedure: COMPUTER ASSISTED TOTAL KNEE ARTHROPLASTY;  Surgeon: Meredith Pel, MD;  Location: [place];  Service: Orthopedics;  Laterality: Right;  right total knee arthroplasty  . KNEE CARTILAGE SURGERY  2009   Right  knee  . PARTIAL GLOSSECTOMY  2017   at [facility]  . SHOULDER ARTHROSCOPY Right   . TOTAL KNEE ARTHROPLASTY Left 06/30/2019   Procedure: LEFT TOTAL KNEE ARTHROPLASTY, CEMENTED;  Surgeon: Meredith Pel, MD;  Location: [place];  Service: Orthopedics;  Laterality: Left;  . TOTAL SHOULDER ARTHROPLASTY Left 02/22/2015  . TOTAL SHOULDER ARTHROPLASTY Left 02/22/2015   Procedure: LEFT TOTAL SHOULDER ARTHROPLASTY;  Surgeon: Meredith Pel, MD;  Location: [place];  Service: Orthopedics;  Laterality: Left;    There were no vitals filed for this visit.  Subjective Assessment - 08/26/19 1107    Subjective  [name] Bell noting improvement in his balance at home - able to stand to dry his legs off after his shower w/o needing to hold onto the counter. States his knee pain keep him up some last night.    Pertinent History  L TKA - 06/30/19; R TKA - 2013; L TSA - 2016    Patient Stated Goals  "To move the L knee & leg with as little pain as possible"    Currently in Pain?  Yes    Pain Score  1     Pain Location  Knee    Pain Orientation  Left    Pain Descriptors / Indicators  Aching    Pain Type  Surgical pain;Acute pain    Pain Frequency  Intermittent         [facility] [name] Bell Assessment - 08/26/19 1104      Assessment   Medical Diagnosis  L TKA    Referring Provider ([name] Bell)  Meredith Pel, MD    Onset Date/Surgical Date  06/30/19    Next MD Visit  09/23/19      Observation/Other Assessments   Focus on Therapeutic Outcomes (FOTO)   Knee - 69% (31% limitation)      AROM   Left Knee Extension  0    Left Knee Flexion  120      PROM   Left Knee Extension  0    Left Knee Flexion  124      Strength   Right Hip Flexion  5/5    Right Hip Extension  4+/5    Right Hip ABduction  4/5    Right Hip ADduction  4+/5    Left Hip Flexion  4+/5    Left Hip Extension  4+/5    Left Hip ABduction  4+/5    Left Hip ADduction  4+/5    Right Knee Flexion  5/5    Right Knee Extension  5/5    Left Knee Flexion  4+/5     Left Knee Extension  --   5-/5     Ambulation/Gait   Gait velocity  2.67 ft/sec with SPC; 3.17 ft/sec w/o AD      Standardized Balance Assessment   Standardized Balance Assessment  10 meter walk test    10 Meter Walk  12.28 sec with SPC; 10.35 sec w/o AD                   [facility] Adult [name] Bell Treatment/Exercise - 08/26/19 1104      Ambulation/Gait   Ambulation/Gait Assistance  6: Modified independent (Device/Increase time)    Ambulation Distance (Feet)  150 Feet    Assistive device  Straight cane;None    Gait Pattern  Within Functional Limits;Step-through pattern    Ambulation Surface  Level;Indoor    Stairs Assistance  5: Supervision    Stair Management Technique  One rail Left;Alternating pattern;Forwards    Number of Stairs  14    Height of Stairs  7    Gait Comments  mild decreased eccentric control on descent from L stance.      Exercises   Exercises  Knee/Hip      Knee/Hip Exercises: Aerobic   Recumbent Bike  L2 x 6 min      Knee/Hip Exercises: Supine   Patellar Mobs  Reviewed [name] Bell instruction in L knee patellar lateral and inf/sup self-mobs (emphasizing sup/inf)      Manual Therapy   Manual Therapy  Joint mobilization    Joint Mobilization  L knee patellar mobs - still slight restricted sup/inf, therefore reviewed self-mobilization for home             [name] Bell Education - 08/26/19 1150    Education Details  Review of patellar mobs    Person(s) Educated  Patient    Methods  Explanation;Demonstration;Handout    Comprehension  Verbalized understanding;Returned demonstration       [name] Bell Short Term Goals - 08/17/19 1319      [name] Bell SHORT TERM GOAL #1   Title  Patient will be independent with initial HEP    Status  Achieved  08/12/19     [name] Bell SHORT TERM GOAL #2   Title  Patient will safely ambulate with SPC demonstrating good gait pattern to increase independence with community mobility    Status  Achieved   08/17/19   Target Date  --        [name] Bell Long Term  Goals - 08/26/19 1111      [name] Bell LONG TERM GOAL #1   Title  Patient will be independent with advanced/ongoing HEP    Status  Partially Met      [name] Bell LONG TERM GOAL #2   Title  L knee AROM >/= 2-120 dg to allow for normal gait and stair mechanics    Status  Achieved   08/21/19     [name] Bell LONG TERM GOAL #3   Title  L hip and knee strength >/= 4+/5 with pain provocation    Status  Achieved   08/26/19     [name] Bell LONG TERM GOAL #4   Title  Patient will ambulate with or without LRAD on all surfaces with good gait pattern    Status  Partially Met      [name] Bell LONG TERM GOAL #5   Title  Patient will safely navigate full flight of stairs with single rail support to allow safe access to basement in home    Status  Partially Met      [name] Bell LONG TERM GOAL #6   Title  Pain level will be no more than 2/10 with all functional activities and patient will be able to sleep through the night w/o pain interference    Status  Partially Met            Plan - 08/26/19 1110    Clinical Impression Statement  [name] Bell has demonstrated good progress with [name] Bell thus far. He is now consistently ambulating in home w/o AD but continues to use cane when he goes out for balance. Gait pattern and speed with cane WNL on level surfaces. He is able to navigate stairs reciprocally with single rail support with slight decreased eccentric control still evident on L descent but no LOB or knee buckling. He notes improving balance with decreasing need for UE support during typical daily tasks. Pain typically well controlled (highest pain reported in past 2 weeks was 1/10, with several days where no pain noted) however still notes some intermittent sleep disturbance due to pain. L knee AROM now 0-120 dg and PROM 0-124 dg. Overall B LE strength now >/= 4+/5 with exception of R hip abduction only 4/5. All STGs met and LTGs at least partially met with ROM (LTG #2) & strength (LTG #3) goals met.  [name] Bell will continue to benefit from further skilled [name] Bell to  address remaining functional strength and balance deficits to increase safety and independence with normal daily activities.    Personal Factors and Comorbidities  Age;Comorbidity 3+;Transportation    Comorbidities  R TKA - 2013; L TSA - 2016; OA; DM; skin and oral cancers; asthmatic bronchitis; anxiety & depression    Rehab Potential  Good    [name] Bell Frequency  3x / week   x 3 weeks, tapering to 2x/wk for duration of POC   [name] Bell Duration  6 weeks    [name] Bell Treatment/Interventions  ADLs/Self Care Home Management;Cryotherapy;Electrical Stimulation;Iontophoresis 68m/ml Dexamethasone;Moist Heat;DME Instruction;Gait training;Stair training;Functional mobility training;Therapeutic activities;Therapeutic exercise;Balance training;Neuromuscular re-education;Patient/family education;Manual techniques;Scar mobilization;Passive range of motion;Dry needling;Energy conservation;Taping;Vasopneumatic Device;Joint Manipulations    [name] Bell Next Visit Plan  L knee ROM; B LE strengthening; balance training;  manual therapy for ROM and STM; modalities PRN    [name] Bell Home Exercise Plan  07/30/19 - incisional scar mobilization, patellar mobs, gentle self-STM to quads with rolling pin; 08/04/19 - seated HS stretch & quad sets, 3-way standing SLR with looped yellow TB; 08/17/19 - step stretch for knee flexion; 08/19/19 - balance activities: SLS, tandem stance & tandem gait with counter support    Consulted and Agree with Plan of Care  Patient       Patient will benefit from skilled therapeutic intervention in order to improve the following deficits and impairments:  Abnormal gait, Cardiopulmonary status limiting activity, Decreased activity tolerance, Decreased balance, Decreased endurance, Decreased knowledge of use of DME, Decreased mobility, Decreased range of motion, Decreased safety awareness, Decreased scar mobility, Decreased strength, Difficulty walking, Increased edema, Increased muscle spasms, Impaired flexibility, Impaired sensation,  Improper body mechanics, Postural dysfunction, Pain  Visit Diagnosis: Acute pain of left knee  Stiffness of left knee, not elsewhere classified  Other abnormalities of gait and mobility  Difficulty in walking, not elsewhere classified  Muscle weakness (generalized)  Localized edema     Problem List Patient Active Problem List   Diagnosis Date Noted  . Arthritis of left knee 06/30/2019  . Arthritis of right shoulder region 09/24/2016  . Arthritis of shoulder region, left 02/22/2015  . Type II or unspecified type diabetes mellitus without mention of complication, not stated as uncontrolled 02/11/2012  . Hypogonadism male 02/11/2012  . Arthritis of knee, right 10/30/2011    [name] Bell, [name] Bell, [name] Bell 08/26/2019, 2:21 PM  [facility] [address]  [address] [place], [place], [address] Phone: [phone]   Fax:  [phone]  Name: [name] Bell MRN: [id number] Date of Birth: 05/13/1934

## 2019-08-28 ENCOUNTER — Other Ambulatory Visit: Payer: Self-pay

## 2019-08-28 ENCOUNTER — Ambulatory Visit: Payer: Medicare Other

## 2019-08-28 DIAGNOSIS — R600 Localized edema: Secondary | ICD-10-CM

## 2019-08-28 DIAGNOSIS — M25662 Stiffness of left knee, not elsewhere classified: Secondary | ICD-10-CM

## 2019-08-28 DIAGNOSIS — M25562 Pain in left knee: Secondary | ICD-10-CM

## 2019-08-28 DIAGNOSIS — R2689 Other abnormalities of gait and mobility: Secondary | ICD-10-CM

## 2019-08-28 DIAGNOSIS — M6281 Muscle weakness (generalized): Secondary | ICD-10-CM

## 2019-08-28 DIAGNOSIS — R262 Difficulty in walking, not elsewhere classified: Secondary | ICD-10-CM

## 2019-08-28 NOTE — Therapy (Signed)
[facility] [address]  [place], [place], [address] Phone: [phone]   Fax:  [phone]  Physical Therapy Treatment  Patient Details  Name: [name] Bell MRN: [id number] Date of Birth: [birth year] Referring Provider (PT): Meredith Pel, MD   Encounter Date: 08/28/2019  PT End of Session - 08/28/19 1027    Visit Number  11    Number of Visits  16    Date for PT Re-Evaluation  09/10/19    Authorization Type  [employer]HC Medicare    PT Start Time  1022    PT Stop Time  1104    PT Time Calculation (min)  42 min    Equipment Utilized During Treatment  Gait belt    Activity Tolerance  Patient tolerated treatment well    Behavior During Therapy  WFL for tasks assessed/performed       Past Medical History:  Diagnosis Date  . Allergy    RHINITIS  . Ambulates with cane   . Anxiety   . Arthritis    osteoarthritis  . Asthmatic bronchitis    seasonal  . Cancer ([place])    skin cancer removed from back  . Depression   . Diabetes mellitus   . FHx: cardiovascular disease   . History of kidney stones   . History of pneumonia    reports d/t bronchitis  . Hx of colonic polyps   . Hypogonadism male   . 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 Pneumonia 2019  . PONV (postoperative nausea and vomiting)    reports has a difficult time coming out of ( knee surgery)  . Seborrheic keratosis     Past Surgical History:  Procedure Laterality Date  . CHOLECYSTECTOMY  2010   GALL BLADDER  . HERNIA REPAIR     during cholecystectomy  . KNEE ARTHROPLASTY  10/30/2011   Procedure: COMPUTER ASSISTED TOTAL KNEE ARTHROPLASTY;  Surgeon: Meredith Pel, MD;  Location: [facility];  Service: Orthopedics;  Laterality: Right;  right total knee arthroplasty  . KNEE CARTILAGE SURGERY  2009   Right knee  . PARTIAL GLOSSECTOMY  2017   at [facility]  . SHOULDER ARTHROSCOPY Right   . TOTAL KNEE ARTHROPLASTY Left 06/30/2019   Procedure: LEFT TOTAL KNEE  ARTHROPLASTY, CEMENTED;  Surgeon: Meredith Pel, MD;  Location: [facility];  Service: Orthopedics;  Laterality: Left;  . TOTAL SHOULDER ARTHROPLASTY Left 02/22/2015  . TOTAL SHOULDER ARTHROPLASTY Left 02/22/2015   Procedure: LEFT TOTAL SHOULDER ARTHROPLASTY;  Surgeon: Meredith Pel, MD;  Location: [facility];  Service: Orthopedics;  Laterality: Left;    There were no vitals filed for this visit.  Subjective Assessment - 08/28/19 1025    Subjective  Pt. reporting he feels he does not need to use cane.    Pertinent History  L TKA - 06/30/19; R TKA - 2013; L TSA - 2016    Patient Stated Goals  "To move the L knee & leg with as little pain as possible"    Currently in Pain?  No/denies    Pain Score  0-No pain    Pain Location  Knee    Pain Orientation  Left    Multiple Pain Sites  No                       [facility] Adult PT Treatment/Exercise - 08/28/19 0001      High Level Balance   High Level  Balance Activities  Tandem walking;Other (comment)    High Level Balance Comments  tandem walking forward/backwards x 2 laps at counter top each way ; close therapist supervision; LOB x 3 with good pt. self-correction without need for therapist CGA;  B single leg stance + eyes closed at counter with light UE support 3 x 10 sec each;  superivsion provided       Knee/Hip Exercises: Stretches   Hip Flexor Stretch  Left;2 reps;30 seconds    Hip Flexor Stretch Limitations  mod thoms position; strap     ITB Stretch  Left;2 reps;30 seconds    ITB Stretch Limitations  supine with strap       Knee/Hip Exercises: Aerobic   Nustep  L4 x 6 min (B UE/LE)      Knee/Hip Exercises: Standing   Heel Raises  Both;20 reps;3 seconds    Heel Raises Limitations  at counter     Hip Abduction  Right;Left;10 reps;Knee straight;Stengthening    Abduction Limitations  standing on airex at counter with fingertip support    Forward Step Up  Left;10 reps;Step Height: 6";Hand Hold: 0    Forward Step Up  Limitations  step up with L and down other side leading with R - some lack of L eccentric control     Step Down  Left;1 set;10 reps;Step Height: 6";Hand Hold: 2   B UE support on machine bolster    Step Down Limitations  lateral step-down off 6" step    Other Standing Knee Exercises  Heel raise x 10 rpes on foam at counter with 1 fingertip support                PT Short Term Goals - 08/17/19 1319      PT SHORT TERM GOAL #1   Title  Patient will be independent with initial HEP    Status  Achieved   08/12/19     PT SHORT TERM GOAL #2   Title  Patient will safely ambulate with SPC demonstrating good gait pattern to increase independence with community mobility    Status  Achieved   08/17/19   Target Date  --        PT Long Term Goals - 08/26/19 1111      PT LONG TERM GOAL #1   Title  Patient will be independent with advanced/ongoing HEP    Status  Partially Met      PT LONG TERM GOAL #2   Title  L knee AROM >/= 2-120 dg to allow for normal gait and stair mechanics    Status  Achieved   08/21/19     PT LONG TERM GOAL #3   Title  L hip and knee strength >/= 4+/5 with pain provocation    Status  Achieved   08/26/19     PT LONG TERM GOAL #4   Title  Patient will ambulate with or without LRAD on all surfaces with good gait pattern    Status  Partially Met      PT LONG TERM GOAL #5   Title  Patient will safely navigate full flight of stairs with single rail support to allow safe access to basement in home    Status  Partially Met      PT LONG TERM GOAL #6   Title  Pain level will be no more than 2/10 with all functional activities and patient will be able to sleep through the night w/o pain interference  Status  Partially Met            Plan - 08/28/19 1053    Clinical Impression Statement  Pt. reporting he is pleased with this progress with PT however wishes to work on his balance.  Focused session on standing narrow BOS balance activities and functional  strengthening on 6" step ups and step-downs.  Tolerated all activities well noting he is no longer having increased L knee pain with therapy activities.  Ended session pain free thus modalities deferred.  Will continue to benefit from further skilled therapy to maximize functional strength and mobility.    Personal Factors and Comorbidities  Age;Comorbidity 3+;Transportation    Comorbidities  R TKA - 2013; L TSA - 2016; OA; DM; skin and oral cancers; asthmatic bronchitis; anxiety & depression    Rehab Potential  Good    PT Treatment/Interventions  ADLs/Self Care Home Management;Cryotherapy;Electrical Stimulation;Iontophoresis 75m/ml Dexamethasone;Moist Heat;DME Instruction;Gait training;Stair training;Functional mobility training;Therapeutic activities;Therapeutic exercise;Balance training;Neuromuscular re-education;Patient/family education;Manual techniques;Scar mobilization;Passive range of motion;Dry needling;Energy conservation;Taping;Vasopneumatic Device;Joint Manipulations    PT Next Visit Plan  L knee ROM; B LE strengthening; balance training; manual therapy for ROM and STM; modalities PRN    PT Home Exercise Plan  07/30/19 - incisional scar mobilization, patellar mobs, gentle self-STM to quads with rolling pin; 08/04/19 - seated HS stretch & quad sets, 3-way standing SLR with looped yellow TB; 08/17/19 - step stretch for knee flexion; 08/19/19 - balance activities: SLS, tandem stance & tandem gait with counter support    Consulted and Agree with Plan of Care  Patient       Patient will benefit from skilled therapeutic intervention in order to improve the following deficits and impairments:  Abnormal gait, Cardiopulmonary status limiting activity, Decreased activity tolerance, Decreased balance, Decreased endurance, Decreased knowledge of use of DME, Decreased mobility, Decreased range of motion, Decreased safety awareness, Decreased scar mobility, Decreased strength, Difficulty walking, Increased  edema, Increased muscle spasms, Impaired flexibility, Impaired sensation, Improper body mechanics, Postural dysfunction, Pain  Visit Diagnosis: Acute pain of left knee  Stiffness of left knee, not elsewhere classified  Other abnormalities of gait and mobility  Difficulty in walking, not elsewhere classified  Muscle weakness (generalized)  Localized edema     Problem List Patient Active Problem List   Diagnosis Date Noted  . Arthritis of left knee 06/30/2019  . Arthritis of right shoulder region 09/24/2016  . Arthritis of shoulder region, left 02/22/2015  . Type II or unspecified type diabetes mellitus without mention of complication, not stated as uncontrolled 02/11/2012  . Hypogonadism male 02/11/2012  . Arthritis of knee, right 10/30/2011    MBess Harvest PTA 08/28/19 12:30 PM    [facility] [address] [place] [address]Phone: [phone]  Fax:  [phone] Name: [name] GOODWYNMRN: [id number] of Birth: [birth year]

## 2019-09-01 ENCOUNTER — Ambulatory Visit: Payer: Medicare Other | Admitting: Physical Therapy

## 2019-09-01 ENCOUNTER — Other Ambulatory Visit: Payer: Self-pay

## 2019-09-01 ENCOUNTER — Encounter: Payer: Self-pay | Admitting: Physical Therapy

## 2019-09-01 DIAGNOSIS — M6281 Muscle weakness (generalized): Secondary | ICD-10-CM

## 2019-09-01 DIAGNOSIS — M25562 Pain in left knee: Secondary | ICD-10-CM

## 2019-09-01 DIAGNOSIS — M25662 Stiffness of left knee, not elsewhere classified: Secondary | ICD-10-CM

## 2019-09-01 DIAGNOSIS — R600 Localized edema: Secondary | ICD-10-CM

## 2019-09-01 DIAGNOSIS — R2689 Other abnormalities of gait and mobility: Secondary | ICD-10-CM

## 2019-09-01 DIAGNOSIS — R262 Difficulty in walking, not elsewhere classified: Secondary | ICD-10-CM

## 2019-09-01 NOTE — Therapy (Signed)
[facility] [address]  [place], [place], [address] Phone: [phone]   Fax:  [phone]  Physical Therapy Treatment  Patient Details  Name: [name] Bell MRN: [id number] Date of Birth: 1934-04-30 Referring Provider (PT): Meredith Pel, MD   Encounter Date: 09/01/2019  PT End of Session - 09/01/19 1103    Visit Number  12    Number of Visits  16    Date for PT Re-Evaluation  09/10/19    Authorization Type  [employer]HC Medicare    PT Start Time  1103    PT Stop Time  1147    PT Time Calculation (min)  44 min    Equipment Utilized During Treatment  Gait belt    Activity Tolerance  Patient tolerated treatment well    Behavior During Therapy  [facility] for tasks assessed/performed       Past Medical History:  Diagnosis Date  . Allergy    RHINITIS  . Ambulates with cane   . Anxiety   . Arthritis    osteoarthritis  . Asthmatic bronchitis    seasonal  . Cancer ([place])    skin cancer removed from back  . Depression   . Diabetes mellitus   . FHx: cardiovascular disease   . History of kidney stones   . History of pneumonia    reports d/t bronchitis  . Hx of colonic polyps   . Hypogonadism male   . 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 Pneumonia 2019  . PONV (postoperative nausea and vomiting)    reports has a difficult time coming out of ( knee surgery)  . Seborrheic keratosis     Past Surgical History:  Procedure Laterality Date  . CHOLECYSTECTOMY  2010   GALL BLADDER  . HERNIA REPAIR     during cholecystectomy  . KNEE ARTHROPLASTY  10/30/2011   Procedure: COMPUTER ASSISTED TOTAL KNEE ARTHROPLASTY;  Surgeon: Meredith Pel, MD;  Location: [facility];  Service: Orthopedics;  Laterality: Right;  right total knee arthroplasty  . KNEE CARTILAGE SURGERY  2009   Right knee  . PARTIAL GLOSSECTOMY  2017   at [facility]  . SHOULDER ARTHROSCOPY Right   . TOTAL KNEE ARTHROPLASTY Left 06/30/2019   Procedure: LEFT TOTAL KNEE  ARTHROPLASTY, CEMENTED;  Surgeon: Meredith Pel, MD;  Location: [facility];  Service: Orthopedics;  Laterality: Left;  . TOTAL SHOULDER ARTHROPLASTY Left 02/22/2015  . TOTAL SHOULDER ARTHROPLASTY Left 02/22/2015   Procedure: LEFT TOTAL SHOULDER ARTHROPLASTY;  Surgeon: Meredith Pel, MD;  Location: [facility];  Service: Orthopedics;  Laterality: Left;    There were no vitals filed for this visit.  Subjective Assessment - 09/01/19 1105    Subjective  Pt reporting "my knee is working wonderfully".    Pertinent History  [name] TKA - 06/30/19; R TKA - 2013; [name] TSA - 2016    Patient Stated Goals  "To move the [name] knee & leg with as little pain as possible"    Currently in Pain?  No/denies                       [facility] Adult PT Treatment/Exercise - 09/01/19 1103      Ambulation/Gait   Ambulation/Gait Assistance  5: Supervision;4: Min guard    Ambulation/Gait Assistance Details  Simulated gait on soft/uneven surfaces on red therapy floor mat, adding balance pebbles between layers to create more unpredictability in surface.  Assistive device  None    Ambulation Surface  Indoor;Unlevel      Neuro Re-ed    Neuro Re-ed Details   Alt fwd step onto Airex pad with contralateral reach to cone atop 36" FR x 10; attempted with step onto BOSU but only able to complete 2 reps d/t instability - CGA/SBA of PT      Exercises   Exercises  Knee/Hip      Knee/Hip Exercises: Aerobic   Recumbent Bike  L2 x 3 miin - stopped d/t SOB      Knee/Hip Exercises: Standing   Hip Flexion Limitations  looped red TB provided for HEP upgrade with a few reps each attempted for hip flexion/abduction/extension to ensure appropraite level of resistance    Hip Abduction  Right;Left;10 reps;Knee bent;Stengthening    Abduction Limitations  Fitter (1 black) - 1 pole A + CGA/SBA of PT    Hip Extension  Right;Left;10 reps;Knee bent;Stengthening    Extension Limitations  Fitter (1 black) - 2 pole A + CGA/SBA of PT    Other  Standing Knee Exercises  Alt toe clears from Airex pad to 9" step x 20; no UE support but close supervison of PT    Other Standing Knee Exercises  B side-stepping & fwd back monster walk with looped yellow TB at ankles 4 x 10 ft- no UE support               PT Short Term Goals - 08/17/19 1319      PT SHORT TERM GOAL #1   Title  Patient will be independent with initial HEP    Status  Achieved   08/12/19     PT SHORT TERM GOAL #2   Title  Patient will safely ambulate with SPC demonstrating good gait pattern to increase independence with community mobility    Status  Achieved   08/17/19   Target Date  --        PT Long Term Goals - 08/26/19 1111      PT LONG TERM GOAL #1   Title  Patient will be independent with advanced/ongoing HEP    Status  Partially Met      PT LONG TERM GOAL #2   Title  [name] knee AROM >/= 2-120 dg to allow for normal gait and stair mechanics    Status  Achieved   08/21/19     PT LONG TERM GOAL #3   Title  [name] hip and knee strength >/= 4+/5 with pain provocation    Status  Achieved   08/26/19     PT LONG TERM GOAL #4   Title  Patient will ambulate with or without LRAD on all surfaces with good gait pattern    Status  Partially Met      PT LONG TERM GOAL #5   Title  Patient will safely navigate full flight of stairs with single rail support to allow safe access to basement in home    Status  Partially Met      PT LONG TERM GOAL #6   Title  Pain level will be no more than 2/10 with all functional activities and patient will be able to sleep through the night w/o pain interference    Status  Partially Met            Plan - 09/01/19 1107    Clinical Impression Statement  [name] Bell is very happy with knee motion and improving stability, noting he is now able  to walk to his bird feeder in his backyard (grassy incline) without need for cane. Continued therapy focus on balance and dynamic strengthening activities to promote increased stability - CGA/SBA  required for some activities but only 2 minor LOB requiring min assist to correct with stepping and reaching activities on unstable surfaces. Patient noting the yellow band he uses for his HEP has gotten easier, so he has been increasing his reps to 20 reps - provided looped red TB for further HEP progression with patient reporting better challenge on sample reps. Jalien reporting he feels he may be nearly ready to transition to the HEP but would like to see how things progress at home over the holiday weekend. Anticipate he should be ready to transition fully to HEP within next 1-2 visits.    Personal Factors and Comorbidities  Age;Comorbidity 3+;Transportation    Comorbidities  R TKA - 2013; [name] TSA - 2016; OA; DM; skin and oral cancers; asthmatic bronchitis; anxiety & depression    Rehab Potential  Good    PT Treatment/Interventions  ADLs/Self Care Home Management;Cryotherapy;Electrical Stimulation;Iontophoresis 29m/ml Dexamethasone;Moist Heat;DME Instruction;Gait training;Stair training;Functional mobility training;Therapeutic activities;Therapeutic exercise;Balance training;Neuromuscular re-education;Patient/family education;Manual techniques;Scar mobilization;Passive range of motion;Dry needling;Energy conservation;Taping;Vasopneumatic Device;Joint Manipulations    PT Next Visit Plan  [name] knee ROM; B LE strengthening; balance training; manual therapy for ROM and STM; modalities PRN    PT Home Exercise Plan  07/30/19 - incisional scar mobilization, patellar mobs, gentle self-STM to quads with rolling pin; 08/04/19 - seated HS stretch & quad sets, 3-way standing SLR with looped yellow TB; 08/17/19 - step stretch for knee flexion; 08/19/19 - balance activities: SLS, tandem stance & tandem gait with counter support    Consulted and Agree with Plan of Care  Patient       Patient will benefit from skilled therapeutic intervention in order to improve the following deficits and impairments:  Abnormal gait,  Cardiopulmonary status limiting activity, Decreased activity tolerance, Decreased balance, Decreased endurance, Decreased knowledge of use of DME, Decreased mobility, Decreased range of motion, Decreased safety awareness, Decreased scar mobility, Decreased strength, Difficulty walking, Increased edema, Increased muscle spasms, Impaired flexibility, Impaired sensation, Improper body mechanics, Postural dysfunction, Pain  Visit Diagnosis: Acute pain of left knee  Stiffness of left knee, not elsewhere classified  Other abnormalities of gait and mobility  Difficulty in walking, not elsewhere classified  Muscle weakness (generalized)  Localized edema     Problem List Patient Active Problem List   Diagnosis Date Noted  . Arthritis of left knee 06/30/2019  . Arthritis of right shoulder region 09/24/2016  . Arthritis of shoulder region, left 02/22/2015  . Type II or unspecified type diabetes mellitus without mention of complication, not stated as uncontrolled 02/11/2012  . Hypogonadism male 02/11/2012  . Arthritis of knee, right 10/30/2011    JPercival Spanish PT, MPT 09/01/2019, 12:18 PM  [facility] [place] [address]Phone: [phone]  Fax:  [phone] Name: LKIMI KROFTMRN: [id number] of Birth: 1June 07, 1935

## 2019-09-08 ENCOUNTER — Ambulatory Visit: Payer: Medicare Other | Attending: Orthopedic Surgery

## 2019-09-08 ENCOUNTER — Other Ambulatory Visit: Payer: Self-pay

## 2019-09-08 DIAGNOSIS — R600 Localized edema: Secondary | ICD-10-CM | POA: Diagnosis present

## 2019-09-08 DIAGNOSIS — R262 Difficulty in walking, not elsewhere classified: Secondary | ICD-10-CM | POA: Diagnosis present

## 2019-09-08 DIAGNOSIS — M25562 Pain in left knee: Secondary | ICD-10-CM

## 2019-09-08 DIAGNOSIS — M25662 Stiffness of left knee, not elsewhere classified: Secondary | ICD-10-CM | POA: Diagnosis present

## 2019-09-08 DIAGNOSIS — M6281 Muscle weakness (generalized): Secondary | ICD-10-CM | POA: Diagnosis present

## 2019-09-08 DIAGNOSIS — R2689 Other abnormalities of gait and mobility: Secondary | ICD-10-CM | POA: Diagnosis present

## 2019-09-08 NOTE — Therapy (Signed)
[facility] [address]  [place], [place], [address] Phone: [phone]   Fax:  [phone]  Physical Therapy Treatment / Discharge Summary  Patient Details  Name: [name] Bell MRN: [id number] Date of Birth: 1933-12-20 Referring Provider (PT): Meredith Pel, MD   Encounter Date: 09/08/2019  PT End of Session - 09/08/19 1110    Visit Number  13    Number of Visits  16    Date for PT Re-Evaluation  09/10/19    Authorization Type  [employer] Medicare    PT Start Time  1105    PT Stop Time  1144    PT Time Calculation (min)  39 min    Equipment Utilized During Treatment  Gait belt    Activity Tolerance  Patient tolerated treatment well    Behavior During Therapy  WFL for tasks assessed/performed       Past Medical History:  Diagnosis Date  . Allergy    RHINITIS  . Ambulates with cane   . Anxiety   . Arthritis    osteoarthritis  . Asthmatic bronchitis    seasonal  . Cancer ([place])    skin cancer removed from back  . Depression   . Diabetes mellitus   . FHx: cardiovascular disease   . History of kidney stones   . History of pneumonia    reports d/t bronchitis  . Hx of colonic polyps   . Hypogonadism male   . 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 Pneumonia 2019  . PONV (postoperative nausea and vomiting)    reports has a difficult time coming out of ( knee surgery)  . Seborrheic keratosis     Past Surgical History:  Procedure Laterality Date  . CHOLECYSTECTOMY  2010   GALL BLADDER  . HERNIA REPAIR     during cholecystectomy  . KNEE ARTHROPLASTY  10/30/2011   Procedure: COMPUTER ASSISTED TOTAL KNEE ARTHROPLASTY;  Surgeon: Meredith Pel, MD;  Location: [place];  Service: Orthopedics;  Laterality: Right;  right total knee arthroplasty  . KNEE CARTILAGE SURGERY  2009   Right knee  . PARTIAL GLOSSECTOMY  2017   at [facility]  . SHOULDER ARTHROSCOPY Right   . TOTAL KNEE ARTHROPLASTY Left 06/30/2019    Procedure: LEFT TOTAL KNEE ARTHROPLASTY, CEMENTED;  Surgeon: Meredith Pel, MD;  Location: [place];  Service: Orthopedics;  Laterality: Left;  . TOTAL SHOULDER ARTHROPLASTY Left 02/22/2015  . TOTAL SHOULDER ARTHROPLASTY Left 02/22/2015   Procedure: LEFT TOTAL SHOULDER ARTHROPLASTY;  Surgeon: Meredith Pel, MD;  Location: [place];  Service: Orthopedics;  Laterality: Left;    There were no vitals filed for this visit.  Subjective Assessment - 09/08/19 1110    Subjective  Pt. reporting he wishes to make today his last visit then transition to home program.    Pertinent History  L TKA - 06/30/19; R TKA - 2013; L TSA - 2016    Patient Stated Goals  "To move the L knee & leg with as little pain as possible"    Currently in Pain?  No/denies    Pain Score  0-No pain    Pain Location  Knee    Pain Orientation  Left    Pain Descriptors / Indicators  Aching    Pain Type  Surgical pain;Acute pain    Pain Onset  More than a month ago    Pain Frequency  Intermittent    Aggravating Factors  prolonged laying on back    Multiple Pain Sites  No         [facility] PT Assessment - 09/08/19 0001      Observation/Other Assessments   Focus on Therapeutic Outcomes (FOTO)   83% (17% limitation)      AROM   AROM Assessment Site  Knee    Right/Left Knee  Left    Left Knee Extension  0    Left Knee Flexion  119      PROM   PROM Assessment Site  Knee    Right/Left Knee  Left    Left Knee Extension  0    Left Knee Flexion  125                   [facility] Adult PT Treatment/Exercise - 09/08/19 0001      Ambulation/Gait   Ambulation/Gait  Yes    Ambulation/Gait Assistance  6: Modified independent (Device/Increase time)   no AD   Ambulation Distance (Feet)  50 Feet    Assistive device  None    Ambulation Surface  Level;Indoor;Outdoor;Gravel;Unlevel    Stairs  Yes    Stairs Assistance  6: Modified independent (Device/Increase time)    Stair Management Technique  One rail  Right;Alternating pattern   no AD   Number of Stairs  14    Height of Stairs  7    Gait Comments  Able to ambulate on grassy surface outdoors with good overall gait mechanics and stability noted; able to ascend/descend steps x 14 with one rail use reciprocally with good stability noted      Neuro Re-ed    Neuro Re-ed Details   Tandem walk forward/backwards at counter with fingertip support x 2 laps      Knee/Hip Exercises: Stretches   Hip Flexor Stretch  Left;2 reps;30 seconds    Hip Flexor Stretch Limitations  mod thoms position; strap       Knee/Hip Exercises: Aerobic   Recumbent Bike  L1 x 6 min       Knee/Hip Exercises: Standing   SLS  L SLS 2 x 15 sec - fingertip support at counter       Manual Therapy   Joint Mobilization  L knee patellar mobs - slight restriction                PT Short Term Goals - 08/17/19 1319      PT SHORT TERM GOAL #1   Title  Patient will be independent with initial HEP    Status  Achieved   08/12/19     PT SHORT TERM GOAL #2   Title  Patient will safely ambulate with SPC demonstrating good gait pattern to increase independence with community mobility    Status  Achieved   08/17/19   Target Date  --        PT Long Term Goals - 09/08/19 1124      PT LONG TERM GOAL #1   Title  Patient will be independent with advanced/ongoing HEP    Status  Achieved   09/08/19     PT LONG TERM GOAL #2   Title  L knee AROM >/= 2-120 dg to allow for normal gait and stair mechanics    Status  Achieved   08/21/19     PT LONG TERM GOAL #3   Title  L hip and knee strength >/= 4+/5 with pain provocation    Status  Achieved   08/26/19  PT LONG TERM GOAL #4   Title  Patient will ambulate with or without LRAD on all surfaces with good gait pattern    Status  Achieved   09/08/19: able to ambulate on grass without AD with good stability     PT LONG TERM GOAL #5   Title  Patient will safely navigate full flight of stairs with single rail support to  allow safe access to basement in home    Status  Achieved      PT LONG TERM GOAL #6   Title  Pain level will be no more than 2/10 with all functional activities and patient will be able to sleep through the night w/o pain interference    Status  Achieved   09/08/19           Plan - 09/08/19 1111    Clinical Impression Statement  Pt. has made excellent progress with physical therapy.  Notes he wishes to transition to home program after today's visit with supervising PT approving this plan.  Pt. able to demo L knee AROM 0-119 dg, PROM 0-125 dg today without pain.  Pt. has previously achieved LTG #2, #3 demonstrating appropriate knee ROM and 4+/5 strength with MMT or better with L hip/knee.  Pt. able to fully achieve LTG #4 demonstrating good gait mechanics with no AD on grass outdoors today and other surfaces.  Pt. able to fully achieve LTG #5 demonstrating safe step-over-step navigation of 1 full flight of stairs with 1 rail use.    Pt. able to fully achieve LTG #6 noting 1/10 pain at most with functional activities and sleeping without interference.  Reviewed comprehensive HEP with pt. reporting/demonstrating most challenge with SLS , tandem walk forward/backwards at counter top today however able to perform safety with light UE support.  Pt. verbalized plans to continue working on balance, strength HEP and leaving session pain free.  Pt. now d/c from therapy per supervising PT approval.    Comorbidities  R TKA - 2013; L TSA - 2016; OA; DM; skin and oral cancers; asthmatic bronchitis; anxiety & depression    Rehab Potential  Good    PT Treatment/Interventions  ADLs/Self Care Home Management;Cryotherapy;Electrical Stimulation;Iontophoresis 40m/ml Dexamethasone;Moist Heat;DME Instruction;Gait training;Stair training;Functional mobility training;Therapeutic activities;Therapeutic exercise;Balance training;Neuromuscular re-education;Patient/family education;Manual techniques;Scar mobilization;Passive  range of motion;Dry needling;Energy conservation;Taping;Vasopneumatic Device;Joint Manipulations    PT Next Visit Plan  d/c    PT Home Exercise Plan  07/30/19 - incisional scar mobilization, patellar mobs, gentle self-STM to quads with rolling pin; 08/04/19 - seated HS stretch & quad sets, 3-way standing SLR with looped yellow TB; 08/17/19 - step stretch for knee flexion; 08/19/19 - balance activities: SLS, tandem stance & tandem gait with counter support    Consulted and Agree with Plan of Care  Patient       Patient will benefit from skilled therapeutic intervention in order to improve the following deficits and impairments:  Abnormal gait, Cardiopulmonary status limiting activity, Decreased activity tolerance, Decreased balance, Decreased endurance, Decreased knowledge of use of DME, Decreased mobility, Decreased range of motion, Decreased safety awareness, Decreased scar mobility, Decreased strength, Difficulty walking, Increased edema, Increased muscle spasms, Impaired flexibility, Impaired sensation, Improper body mechanics, Postural dysfunction, Pain  Visit Diagnosis: Acute pain of left knee  Stiffness of left knee, not elsewhere classified  Other abnormalities of gait and mobility  Difficulty in walking, not elsewhere classified  Muscle weakness (generalized)  Localized edema     Problem List Patient Active Problem List  Diagnosis Date Noted  . Arthritis of left knee 06/30/2019  . Arthritis of right shoulder region 09/24/2016  . Arthritis of shoulder region, left 02/22/2015  . Type II or unspecified type diabetes mellitus without mention of complication, not stated as uncontrolled 02/11/2012  . Hypogonadism male 02/11/2012  . Arthritis of knee, right 10/30/2011    Bess Harvest, PTA 09/08/19 12:12 PM    [facility] [address]  [place], [place], [address] Phone: [phone]   Fax:  [phone]  Name:  [name] Bell MRN: [id number] Date of Birth: December 21, 1933    PHYSICAL THERAPY DISCHARGE SUMMARY  Visits from Start of Care: 13  Current functional level related to goals / functional outcomes:   Refer to above clinical impression.   Remaining deficits:   As above.   Education / Equipment:   HEP  Plan: Patient agrees to discharge.  Patient goals were met. Patient is being discharged due to meeting the stated rehab goals.  ?????     Percival Spanish, PT, MPT 09/08/19, 1:04 PM  [facility] [address]  [place], [place], [address] Phone: [phone]   Fax:  [phone]

## 2019-09-10 ENCOUNTER — Encounter: Payer: Medicare Other | Admitting: Physical Therapy

## 2019-09-23 ENCOUNTER — Ambulatory Visit (INDEPENDENT_AMBULATORY_CARE_PROVIDER_SITE_OTHER): Payer: Medicare Other | Admitting: Orthopedic Surgery

## 2019-09-23 ENCOUNTER — Other Ambulatory Visit: Payer: Self-pay

## 2019-09-23 DIAGNOSIS — Z96659 Presence of unspecified artificial knee joint: Secondary | ICD-10-CM

## 2019-09-24 ENCOUNTER — Encounter: Payer: Self-pay | Admitting: Orthopedic Surgery

## 2019-09-24 NOTE — Progress Notes (Signed)
Post-Op Visit Note   Patient: [name] Bell           Date of Birth: 1934-09-11           MRN: [id number] Visit Date: 09/23/2019 PCP: Governor Specking, MD   Assessment & Plan:  Chief Complaint:  Chief Complaint  Patient presents with  . Follow-up   Visit Diagnoses: No diagnosis found.  Plan: [name] Bell is now 3 months out left total knee replacement.  He is doing well.  Range of motion is 5-1 24.  Doing home exercise program.  Balance is his main issue now.  Finished therapy 2 weeks ago.  Uses an occasional cane for when he is on long walks like at [employer].  He is doing the bike.  All in all [name] Bell is done well following his left knee replacement.  He is going to recover from the surgery and consider shoulder surgery in the near future.  We will see him back as needed.  Follow-Up Instructions: Return if symptoms worsen or fail to improve.   Orders:  No orders of the defined types were placed in this encounter.  No orders of the defined types were placed in this encounter.   Imaging: No results found.  PMFS History: Patient Active Problem List   Diagnosis Date Noted  . Arthritis of left knee 06/30/2019  . Arthritis of right shoulder region 09/24/2016  . Arthritis of shoulder region, left 02/22/2015  . Type II or unspecified type diabetes mellitus without mention of complication, not stated as uncontrolled 02/11/2012  . Hypogonadism male 02/11/2012  . Arthritis of knee, right 10/30/2011   Past Medical History:  Diagnosis Date  . Allergy    RHINITIS  . Ambulates with cane   . Anxiety   . Arthritis    osteoarthritis  . Asthmatic bronchitis    seasonal  . Cancer ([place])    skin cancer removed from back  . Depression   . Diabetes mellitus   . FHx: cardiovascular disease   . History of kidney stones   . History of pneumonia    reports d/t bronchitis  . Hx of colonic polyps   . Hypogonadism male   . 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 Pneumonia 2019  . PONV (postoperative  nausea and vomiting)    reports has a difficult time coming out of ( knee surgery)  . Seborrheic keratosis     History reviewed. No pertinent family history.  Past Surgical History:  Procedure Laterality Date  . CHOLECYSTECTOMY  2010   GALL BLADDER  . HERNIA REPAIR     during cholecystectomy  . KNEE ARTHROPLASTY  10/30/2011   Procedure: COMPUTER ASSISTED TOTAL KNEE ARTHROPLASTY;  Surgeon: Meredith Pel, MD;  Location: [place];  Service: Orthopedics;  Laterality: Right;  right total knee arthroplasty  . KNEE CARTILAGE SURGERY  2009   Right knee  . PARTIAL GLOSSECTOMY  2017   at [facility]  . SHOULDER ARTHROSCOPY Right   . TOTAL KNEE ARTHROPLASTY Left 06/30/2019   Procedure: LEFT TOTAL KNEE ARTHROPLASTY, CEMENTED;  Surgeon: Meredith Pel, MD;  Location: [place];  Service: Orthopedics;  Laterality: Left;  . TOTAL SHOULDER ARTHROPLASTY Left 02/22/2015  . TOTAL SHOULDER ARTHROPLASTY Left 02/22/2015   Procedure: LEFT TOTAL SHOULDER ARTHROPLASTY;  Surgeon: Meredith Pel, MD;  Location: [place];  Service: Orthopedics;  Laterality: Left;   Social History   Occupational History  . Not on file  Tobacco Use  . Smoking status:  Never Smoker  . Smokeless tobacco: Never Used  Substance and Sexual Activity  . Alcohol use: Yes    Alcohol/week: 3.0 standard drinks    Types: 3 Glasses of wine per week    Comment: occasional  . Drug use: No  . Sexual activity: Not Currently

## 2019-12-26 ENCOUNTER — Other Ambulatory Visit: Payer: Self-pay | Admitting: Surgical

## 2019-12-28 ENCOUNTER — Ambulatory Visit: Payer: Medicare PPO | Attending: Internal Medicine

## 2019-12-28 DIAGNOSIS — Z23 Encounter for immunization: Secondary | ICD-10-CM

## 2019-12-28 NOTE — Progress Notes (Signed)
   Covid-19 Vaccination Clinic  Name:  [name] Bell    MRN: [id number] DOB: [birth year]  12/28/2019  [name] Bell was observed post Covid-19 immunization for 15 minutes without incident. He was provided with Vaccine Information Sheet and instruction to access the V-Safe system.   [name] Bell was instructed to call 911 with any severe reactions post vaccine: Marland Kitchen Difficulty breathing  . Swelling of face and throat  . A fast heartbeat  . A bad rash all over body  . Dizziness and weakness   Immunizations Administered    Name Date Dose VIS Date Route   Pfizer COVID-19 Vaccine 12/28/2019  9:31 AM 0.3 mL 09/18/2019 Intramuscular   Manufacturer: [place]   Lot: [id number]   [place]: [id number]

## 2020-01-20 ENCOUNTER — Ambulatory Visit: Payer: Medicare PPO | Attending: Internal Medicine

## 2020-01-20 DIAGNOSIS — Z23 Encounter for immunization: Secondary | ICD-10-CM

## 2020-01-20 NOTE — Progress Notes (Signed)
   Covid-19 Vaccination Clinic  Name:  [name] Bell    MRN: [id number] DOB: Jan 19, 1934  01/20/2020  [name] Bell was observed post Covid-19 immunization for 15 minutes without incident. He was provided with Vaccine Information Sheet and instruction to access the V-Safe system.   [name] Bell was instructed to call 911 with any severe reactions post vaccine: Marland Kitchen Difficulty breathing  . Swelling of face and throat  . A fast heartbeat  . A bad rash all over body  . Dizziness and weakness   Immunizations Administered    Name Date Dose VIS Date Route   Pfizer COVID-19 Vaccine 01/20/2020 10:15 AM 0.3 mL 09/18/2019 Intramuscular   Manufacturer: [place]   Lot: [id number]   [place]: [id number]

## 2020-11-16 ENCOUNTER — Ambulatory Visit (INDEPENDENT_AMBULATORY_CARE_PROVIDER_SITE_OTHER): Payer: Medicare PPO

## 2020-11-16 ENCOUNTER — Ambulatory Visit (INDEPENDENT_AMBULATORY_CARE_PROVIDER_SITE_OTHER): Payer: Medicare PPO | Admitting: Surgical

## 2020-11-16 ENCOUNTER — Encounter: Payer: Self-pay | Admitting: Surgical

## 2020-11-16 DIAGNOSIS — M79604 Pain in right leg: Secondary | ICD-10-CM

## 2020-11-16 DIAGNOSIS — M25551 Pain in right hip: Secondary | ICD-10-CM

## 2020-11-16 NOTE — Progress Notes (Signed)
Office Visit Note   Patient: [name] Bell           Date of Birth: [birth year]           MRN: [id number] Visit Date: 11/16/2020 Requested by: Governor Specking, MD [address] [place],  [place] [address] PCP: Governor Specking, MD  Subjective: Chief Complaint  Patient presents with  . Right Hip - Pain  . Spine - Pain    HPI: [name] Bell is a 87 y.o. male who presents to the office complaining of right hip pain.  Patient states that this began weeks ago after tripping over a rug.  Localizes pain to the lateral aspect of the right hip over the greater trochanter.  Denies any groin pain, buttock pain, numbness/tingling.  Does note some low back pain that bothers him equally between his right and left side of the low back.  He has difficulty laying on the right hip when he tries to sleep at night due to pain.  No history of hip or back surgery..                ROS: All systems reviewed are negative as they relate to the chief complaint within the history of present illness.  Patient denies fevers or chills.  Assessment & Plan: Visit Diagnoses:  1. Greater trochanteric pain syndrome of right lower extremity   2. Pain in right leg     Plan: Patient is an 87 year old male presents complaint of right hip pain.  Patient localizes the majority of his pain to the lateral aspect of the right hip.  Radiographs were taken today of the right hip and lumbar spine.  Mild degenerative changes are noted in the right hip joint but nothing that is significant.  Patient's lumbar spine radiographs do reveal some diffuse degenerative changes with significant loss of disc space at L4-L5 and spondylolisthesis at L5-S1 with some loss of disc space at that level as well.  However, patient's clinical history and exam do not correlate with intra-articular hip pathology or with radicular pain.  This seems to be focal lateral hip pain that is consistent with greater trochanteric pain syndrome.  Discussed options  available to patient.  He would like to try physical therapy.  Prescribe physical therapy to be done at [facility].  Follow-up in 6 weeks for clinical recheck.  If no improvement at that time consider trochanteric cortisone injection.  Patient agreed with this plan.  Follow-Up Instructions: No follow-ups on file.   Orders:  Orders Placed This Encounter  Procedures  . XR HIP UNILAT W OR W/O PELVIS 2-3 VIEWS RIGHT  . XR Lumbar Spine 2-3 Views   No orders of the defined types were placed in this encounter.     Procedures: No procedures performed   Clinical Data: No additional findings.  Objective: Vital Signs: There were no vitals taken for this visit.  Physical Exam:  Constitutional: Patient appears well-developed HEENT:  Head: Normocephalic Eyes:EOM are normal Neck: Normal range of motion Cardiovascular: Normal rate Pulmonary/chest: Effort normal Neurologic: Patient is alert Skin: Skin is warm Psychiatric: Patient has normal mood and affect  Ortho Exam: Ortho exam demonstrates right hip with no significant pain with passive hip flexion.  No pain with internal rotation/external rotation of the right hip joint.  Tenderness over the right greater trochanter that is not present on the contralateral side.  No significant tenderness throughout the axial lumbar spine or paraspinal musculature.  Negative straight leg raise.  Negative Stinchfield exam.  No Trendelenburg gait noted.  +5 motor strength of bilateral hip flexors, quadricep, hamstring, dorsiflexion, plantarflexion.  Specialty Comments:  No specialty comments available.  Imaging: No results found.   PMFS History: Patient Active Problem List   Diagnosis Date Noted  . Arthritis of left knee 06/30/2019  . Arthritis of right shoulder region 09/24/2016  . Arthritis of shoulder region, left 02/22/2015  . Type II or unspecified type diabetes mellitus without mention of complication, not stated as uncontrolled  02/11/2012  . Hypogonadism male 02/11/2012  . Arthritis of knee, right 10/30/2011   Past Medical History:  Diagnosis Date  . Allergy    RHINITIS  . Ambulates with cane   . Anxiety   . Arthritis    osteoarthritis  . Asthmatic bronchitis    seasonal  . Cancer ([place])    skin cancer removed from back  . Depression   . Diabetes mellitus   . FHx: cardiovascular disease   . History of kidney stones   . History of pneumonia    reports d/t bronchitis  . Hx of colonic polyps   . Hypogonadism male   . 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 Pneumonia 2019  . PONV (postoperative nausea and vomiting)    reports has a difficult time coming out of ( knee surgery)  . Seborrheic keratosis     No family history on file.  Past Surgical History:  Procedure Laterality Date  . CHOLECYSTECTOMY  2010   GALL BLADDER  . HERNIA REPAIR     during cholecystectomy  . KNEE ARTHROPLASTY  10/30/2011   Procedure: COMPUTER ASSISTED TOTAL KNEE ARTHROPLASTY;  Surgeon: Meredith Pel, MD;  Location: [facility];  Service: Orthopedics;  Laterality: Right;  right total knee arthroplasty  . KNEE CARTILAGE SURGERY  2009   Right knee  . PARTIAL GLOSSECTOMY  2017   at [facility]  . SHOULDER ARTHROSCOPY Right   . TOTAL KNEE ARTHROPLASTY Left 06/30/2019   Procedure: LEFT TOTAL KNEE ARTHROPLASTY, CEMENTED;  Surgeon: Meredith Pel, MD;  Location: [facility];  Service: Orthopedics;  Laterality: Left;  . TOTAL SHOULDER ARTHROPLASTY Left 02/22/2015  . TOTAL SHOULDER ARTHROPLASTY Left 02/22/2015   Procedure: LEFT TOTAL SHOULDER ARTHROPLASTY;  Surgeon: Meredith Pel, MD;  Location: [facility];  Service: Orthopedics;  Laterality: Left;   Social History   Occupational History  . Not on file  Tobacco Use  . Smoking status: Never Smoker  . Smokeless tobacco: Never Used  Vaping Use  . Vaping Use: Never used  Substance and Sexual Activity  . Alcohol use: Yes    Alcohol/week: 3.0 standard drinks    Types: 3 Glasses of wine per week     Comment: occasional  . Drug use: No  . Sexual activity: Not Currently

## 2020-11-18 NOTE — Addendum Note (Signed)
Addended by: Robyne Peers on: 11/18/2020 08:38 AM   Modules accepted: Orders

## 2020-12-05 ENCOUNTER — Ambulatory Visit: Payer: Medicare PPO | Attending: Surgical | Admitting: Physical Therapy

## 2020-12-07 ENCOUNTER — Ambulatory Visit: Payer: Medicare PPO | Attending: Surgical | Admitting: Physical Therapy

## 2020-12-07 ENCOUNTER — Other Ambulatory Visit: Payer: Self-pay

## 2020-12-07 DIAGNOSIS — M25551 Pain in right hip: Secondary | ICD-10-CM | POA: Diagnosis not present

## 2020-12-07 DIAGNOSIS — R262 Difficulty in walking, not elsewhere classified: Secondary | ICD-10-CM | POA: Insufficient documentation

## 2020-12-07 NOTE — Therapy (Signed)
[facility] [address]  [place], [place], [address] Phone: [phone]   Fax:  [phone]  Physical Therapy Evaluation  Patient Details  Name: [name] Bell MRN: [id number] Date of Birth: Feb 27, 1934 Referring Provider (PT): Magnant   Encounter Date: 12/07/2020   PT End of Session - 12/07/20 1535    Visit Number 1    Date for PT Re-Evaluation 02/06/21    Authorization Type [employer]    PT Start Time 6962    PT Stop Time 1530    PT Time Calculation (min) 45 min    Activity Tolerance Patient tolerated treatment well    Behavior During Therapy [facility] for tasks assessed/performed           Past Medical History:  Diagnosis Date  . Allergy    RHINITIS  . Ambulates with cane   . Anxiety   . Arthritis    osteoarthritis  . Asthmatic bronchitis    seasonal  . Cancer ([place])    skin cancer removed from back  . Depression   . Diabetes mellitus   . FHx: cardiovascular disease   . History of kidney stones   . History of pneumonia    reports d/t bronchitis  . Hx of colonic polyps   . Hypogonadism male   . 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 Pneumonia 2019  . PONV (postoperative nausea and vomiting)    reports has a difficult time coming out of ( knee surgery)  . Seborrheic keratosis     Past Surgical History:  Procedure Laterality Date  . CHOLECYSTECTOMY  2010   GALL BLADDER  . HERNIA REPAIR     during cholecystectomy  . KNEE ARTHROPLASTY  10/30/2011   Procedure: COMPUTER ASSISTED TOTAL KNEE ARTHROPLASTY;  Surgeon: Meredith Pel, MD;  Location: [place];  Service: Orthopedics;  Laterality: Right;  right total knee arthroplasty  . KNEE CARTILAGE SURGERY  2009   Right knee  . PARTIAL GLOSSECTOMY  2017   at [facility]  . SHOULDER ARTHROSCOPY Right   . TOTAL KNEE ARTHROPLASTY Left 06/30/2019   Procedure: LEFT TOTAL KNEE ARTHROPLASTY, CEMENTED;  Surgeon: Meredith Pel, MD;  Location: [place];  Service: Orthopedics;   Laterality: Left;  . TOTAL SHOULDER ARTHROPLASTY Left 02/22/2015  . TOTAL SHOULDER ARTHROPLASTY Left 02/22/2015   Procedure: LEFT TOTAL SHOULDER ARTHROPLASTY;  Surgeon: Meredith Pel, MD;  Location: [place];  Service: Orthopedics;  Laterality: Left;    There were no vitals filed for this visit.    Subjective Assessment - 12/07/20 1456    Subjective Pateint reports that he has had right hip pain for over a month, he was taking tylenol to help but the pain got worse, he saw orhtopedics 11/16/20 and then went to the ED on 11/29/20 due to severe right hip pain.  He reports that CT showed DDD of the low back. mild degenerative changes in the hip.  He reports that he has been taking a different medication and had an injection that has helped.    Pertinent History anxiety, depression, DM, oral cancer    Limitations Lifting;Standing;Walking;House hold activities    How long can you stand comfortably? 2-3 minutes    Patient Stated Goals have less pain, walk better, be stronger, better and easier function    Currently in Pain? Yes    Pain Score 1     Pain Location Hip    Pain Orientation Right;Lateral    Pain  Descriptors / Indicators Aching;Sharp    Pain Type Acute pain    Aggravating Factors  stairs, trying to get comfortable at night pain up to 8-9/10    Pain Relieving Factors the medication helps, 0/10 at best    Effect of Pain on Daily Activities difficulty with stairs, sleeping, dressing , standing              [facility] PT Assessment - 12/07/20 0001      Assessment   Medical Diagnosis right GT bursitis    Referring Provider (PT) Magnant    Onset Date/Surgical Date 11/15/20    Prior Therapy no      Precautions   Precautions None      Balance Screen   Has the patient fallen in the past 6 months Yes    How many times? 2    Has the patient had a decrease in activity level because of a fear of falling?  No    Is the patient reluctant to leave their home because of a fear of falling?   No      Home Environment   Additional Comments has stairs, does the cooking and cleaning      Prior Function   Level of Independence Independent   prior to January did not use any device   Vocation Retired    Leisure no exercises      Posture/Postural Control   Posture Comments fwd head, rounded shoulders, decreased lordosis      ROM / Strength   AROM / PROM / Strength AROM;Strength      AROM   AROM Assessment Site Hip    Right/Left Hip Right    Right Hip Extension 10    Right Hip Flexion 80    Right Hip External Rotation  25    Right Hip ABduction 15      Strength   Strength Assessment Site Hip    Right/Left Hip Right    Right Hip Flexion 4-/5    Right Hip Extension 4-/5    Right Hip ABduction 4-/5    Right Hip ADduction 4-/5      Flexibility   Soft Tissue Assessment /Muscle Length yes    Hamstrings tight    ITB very tight and painful    Piriformis good      Palpation   Palpation comment very tender in the right GT area      Transfers   Comments when standing up from sitting, he reports that the right leg is unstable, he has a quick time of unsteadiness with the firs step.      Ambulation/Gait   Gait Comments uses a cane, antalgic on the right, very slow, reports "takes me 5x longer to move and dress now", small steps      Standardized Balance Assessment   Standardized Balance Assessment Timed Up and Go Test      Timed Up and Go Test   Normal TUG (seconds) 22    TUG Comments uses SPC, small steps                      Objective measurements completed on examination: See above findings.                 PT Short Term Goals - 12/07/20 1544      PT SHORT TERM GOAL #1   Title Patient will be independent with initial HEP    Time 2    Period Weeks  Status New             PT Long Term Goals - 12/07/20 1544      PT LONG TERM GOAL #1   Title Patient will be independent with advanced/ongoing HEP    Time 8    Period Weeks     Status New      PT LONG TERM GOAL #2   Title decrease TUG time to 15 seconds    Time 8    Period Weeks    Status New      PT LONG TERM GOAL #3   Title L hip and knee strength >/= 4+/5    Time 8    Period Weeks    Status New      PT LONG TERM GOAL #4   Title will report no difficulty dressing    Time 8    Period Weeks    Status New      PT LONG TERM GOAL #5   Title Patient will safely navigate full flight of stairs with single rail support to allow safe access to basement in home without pain    Time 8    Period Weeks    Status New                  Plan - 12/07/20 1536    Clinical Impression Statement Patient reports that he has had right hip pain for a few nmonths, he reports that he went to the MD early February, he reports that he went to the ED due to severe right hip pain 11/29/20.  CT scan showed DDD in the lumbar spine and some mild degenerative changes in the hip, the MD felt like is was GT bursitis, he was given and injection and started on some medication.  He reports that he is feeling better with less pain, he reports that he is much slower with walking and dressing.  He reports being unsteady with his first few steps.  TUG was 22 seconds, small steps.  Tender right GT area, very tight and painful ITB.    Personal Factors and Comorbidities Comorbidity 3+    Comorbidities anxiety, depression, DM, oral CA, TKA, shoulder surgeries    Examination-Activity Limitations Bed Mobility;Sit;Stairs;Squat;Dressing;Stand;Toileting;Lift;Transfers;Locomotion Level    Examination-Participation Restrictions Cleaning;Shop;Laundry;Yard Work    Stability/Clinical Decision Making Stable/Uncomplicated    Clinical Decision Making Low    Rehab Potential Good    PT Frequency 1x / week    PT Duration 8 weeks    PT Treatment/Interventions ADLs/Self Care Home Management;Cryotherapy;Electrical Stimulation;Iontophoresis 4mg /ml Dexamethasone;Moist Heat;Gait training;Balance  training;Therapeutic exercise;Therapeutic activities;Functional mobility training;Stair training;Patient/family education;Manual techniques    PT Next Visit Plan slowly work on LE strength and flexibility, functional gait and balance    Consulted and Agree with Plan of Care Patient           Patient will benefit from skilled therapeutic intervention in order to improve the following deficits and impairments:  Abnormal gait,Decreased range of motion,Decreased activity tolerance,Pain,Impaired flexibility,Improper body mechanics,Decreased balance,Decreased mobility,Decreased strength,Postural dysfunction  Visit Diagnosis: Pain in right hip - Plan: PT plan of care cert/re-cert  Difficulty in walking, not elsewhere classified - Plan: PT plan of care cert/re-cert     Problem List Patient Active Problem List   Diagnosis Date Noted  . Arthritis of left knee 06/30/2019  . Arthritis of right shoulder region 09/24/2016  . Arthritis of shoulder region, left 02/22/2015  . Type II or unspecified type diabetes mellitus without mention of  complication, not stated as uncontrolled 02/11/2012  . Hypogonadism male 02/11/2012  . Arthritis of knee, right 10/30/2011    Sumner Boast., PT 12/07/2020, 3:50 PM  [facility] [address]  [place], [place], [address] Phone: [phone]   Fax:  [phone]  Name: [name] Bell MRN: [id number] Date of Birth: [birth year]

## 2020-12-07 NOTE — Patient Instructions (Signed)
Access Code: [email or web address] URL: [email or web address] Date: 12/07/2020 Prepared by: Lum Babe  Exercises Supine Bridge - 2 x daily - 7 x weekly - 1 sets - 10 reps - 2 hold Supine ITB Stretch with Strap - 2 x daily - 7 x weekly - 1 sets - 5 reps - 30 hold Supine Hip Adduction Isometric with Ball - 2 x daily - 7 x weekly - 1 sets - 10 reps - 2 hold Hooklying Single Knee to Chest Stretch - 2 x daily - 7 x weekly - 1 sets - 10 reps - 10 hold

## 2020-12-13 ENCOUNTER — Ambulatory Visit: Payer: Medicare PPO | Admitting: Physical Therapy

## 2020-12-13 ENCOUNTER — Encounter: Payer: Self-pay | Admitting: Physical Therapy

## 2020-12-13 ENCOUNTER — Other Ambulatory Visit: Payer: Self-pay

## 2020-12-13 DIAGNOSIS — M25551 Pain in right hip: Secondary | ICD-10-CM | POA: Diagnosis not present

## 2020-12-13 DIAGNOSIS — R262 Difficulty in walking, not elsewhere classified: Secondary | ICD-10-CM

## 2020-12-13 NOTE — Patient Instructions (Signed)
    Access Code: [email or web address] URL: [email or web address] Date: 12/13/2020 Prepared by: Annie Paras  Exercises Seated Hamstring Stretch - 2-3 x daily - 7 x weekly - 3 reps - 30 sec hold Seated Hip Abduction with Resistance - 1 x daily - 7 x weekly - 10 reps - 3-5 sec hold Sit to Stand - 1 x daily - 7 x weekly - 10 reps Squat with Chair Touch - 1 x daily - 7 x weekly - 10 reps - 3 sec hold

## 2020-12-13 NOTE — Therapy (Signed)
[name] [address]  [name], [place], [address] Phone: [phone]   Fax:  [phone]  Physical Therapy Treatment  Patient Details  Name: [name] Bell MRN: [id number] Date of Birth: January 10, 1934 Referring Provider (PT): Magnant   Encounter Date: 12/13/2020   PT End of Session - 12/13/20 1538    Visit Number 2    Date for PT Re-Evaluation 02/06/21    Authorization Type [employer]    Authorization Time Period 12/07/20 - 02/06/21    Authorization - Visit Number 1    Authorization - Number of Visits 12    PT Start Time 6962    PT Stop Time 1619    PT Time Calculation (min) 41 min    Activity Tolerance Patient tolerated treatment well    Behavior During Therapy [facility] for tasks assessed/performed           Past Medical History:  Diagnosis Date  . Allergy    RHINITIS  . Ambulates with cane   . Anxiety   . Arthritis    osteoarthritis  . Asthmatic bronchitis    seasonal  . Cancer ([place])    skin cancer removed from back  . Depression   . Diabetes mellitus   . FHx: cardiovascular disease   . History of kidney stones   . History of pneumonia    reports d/t bronchitis  . Hx of colonic polyps   . Hypogonadism male   . 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 Pneumonia 2019  . PONV (postoperative nausea and vomiting)    reports has a difficult time coming out of ( knee surgery)  . Seborrheic keratosis     Past Surgical History:  Procedure Laterality Date  . CHOLECYSTECTOMY  2010   GALL BLADDER  . HERNIA REPAIR     during cholecystectomy  . KNEE ARTHROPLASTY  10/30/2011   Procedure: COMPUTER ASSISTED TOTAL KNEE ARTHROPLASTY;  Surgeon: Meredith Pel, MD;  Location: [place];  Service: Orthopedics;  Laterality: Right;  right total knee arthroplasty  . KNEE CARTILAGE SURGERY  2009   Right knee  . PARTIAL GLOSSECTOMY  2017   at [facility]  . SHOULDER ARTHROSCOPY Right   . TOTAL KNEE ARTHROPLASTY Left 06/30/2019    Procedure: LEFT TOTAL KNEE ARTHROPLASTY, CEMENTED;  Surgeon: Meredith Pel, MD;  Location: [place];  Service: Orthopedics;  Laterality: Left;  . TOTAL SHOULDER ARTHROPLASTY Left 02/22/2015  . TOTAL SHOULDER ARTHROPLASTY Left 02/22/2015   Procedure: LEFT TOTAL SHOULDER ARTHROPLASTY;  Surgeon: Meredith Pel, MD;  Location: [place];  Service: Orthopedics;  Laterality: Left;    There were no vitals filed for this visit.   Subjective Assessment - 12/13/20 1540    Subjective Pt reports his pain is relatively mild.    Pertinent History anxiety, depression, DM, oral cancer    Currently in Pain? Yes    Pain Score 3     Pain Location Hip    Pain Orientation Right;Lateral    Pain Descriptors / Indicators Aching    Pain Type Acute pain    Pain Frequency Constant                             [facility] Adult PT Treatment/Exercise - 12/13/20 1538      Exercises   Exercises Knee/Hip      Knee/Hip Exercises: Stretches   Passive Hamstring Stretch Right;2 reps;30 seconds  Passive Hamstring Stretch Limitations seated hip hinge    ITB Stretch Right;5 reps;30 seconds    ITB Stretch Limitations supine crossbody with strap    Other Knee/Hip Stretches R SKTC 5 x 10"      Knee/Hip Exercises: Standing   Functional Squat 5 reps;3 seconds    Functional Squat Limitations supported (counter) squat with chair for safety      Knee/Hip Exercises: Seated   Clamshell with TheraBand Red   10 x 5"   Marching Both;5 reps    Marching Limitations looped red TB at knees   discontinued d/t increased pain   Sit to [employer] 10 reps;without UE support   cues for proper weight shift & posture     Knee/Hip Exercises: Supine   Hip Adduction Isometric Both;10 reps;Strengthening   5" hold   Hip Adduction Isometric Limitations hooklying ball squeeze    Bridges Both;10 reps   5" hold   Bridges Limitations cues to avoid thrusting motion - initiating motion with abd bracing & glute set then slowly  lifting                  PT Education - 12/13/20 1616    Education Details HEP review & update - Access Code: [email or web address]    Person(s) Educated Patient    Methods Explanation;Demonstration;Verbal cues;Handout    Comprehension Verbalized understanding;Verbal cues required;Returned demonstration;Need further instruction            PT Short Term Goals - 12/13/20 1544      PT SHORT TERM GOAL #1   Title Patient will be independent with initial HEP    Time 2    Period Weeks    Status On-going             PT Long Term Goals - 12/13/20 1544      PT LONG TERM GOAL #1   Title Patient will be independent with advanced/ongoing HEP    Time 8    Period Weeks    Status On-going    Target Date 02/06/21      PT LONG TERM GOAL #2   Title decrease TUG time to 15 seconds    Time 8    Period Weeks    Status On-going    Target Date 02/06/21      PT LONG TERM GOAL #3   Title L hip and knee strength >/= 4+/5    Time 8    Period Weeks    Status On-going    Target Date 02/06/21      PT LONG TERM GOAL #4   Title will report no difficulty dressing    Time 8    Period Weeks    Status On-going    Target Date 02/06/21      PT LONG TERM GOAL #5   Title Patient will safely navigate full flight of stairs with single rail support to allow safe access to basement in home without pain    Time 8    Period Weeks    Status On-going    Target Date 02/06/21                 Plan - 12/13/20 1547    Clinical Impression Statement [place] reports he feels like he is doing well with his HEP but some clarifications necessary upon review, especially hold times for stretches and proper muscle activation avoiding forceful thrust with bridges, with pt reporting better understanding/comfort following review. He notes one of his greatest areas  of difficulty is stability with sit to stand transfers, therefore reviewed technique emphasizing proper alignment and weight shift to increase ease of  transfer and stability upon rising. Gently progressed stretching and strengthening to promote improved proximal flexibility and stability with HEP update to include some of today's new activities.    Personal Factors and Comorbidities Comorbidity 3+    Comorbidities anxiety, depression, DM, oral CA, TKA, shoulder surgeries    Examination-Activity Limitations Bed Mobility;Sit;Stairs;Squat;Dressing;Stand;Toileting;Lift;Transfers;Locomotion Level    Examination-Participation Restrictions Cleaning;Shop;Laundry;Yard Work    [employer]chant [employer] officer Evolving/Moderate complexity    Rehab Potential Good    PT Frequency 1x / week    PT Duration 8 weeks    PT Treatment/Interventions ADLs/Self Care Home Management;Cryotherapy;Electrical Stimulation;Iontophoresis 4mg /ml Dexamethasone;Moist Heat;Gait training;Balance training;Therapeutic exercise;Therapeutic activities;Functional mobility training;Stair training;Patient/family education;Manual techniques    PT Next Visit Plan slowly work on LE strength and flexibility, functional gait and balance, potential trial of ionto patch pending control of DM    PT Home Exercise Plan MedBridge Access Code: [email or web address] (3/2); [email or web address] (3/8)    Consulted and Agree with Plan of Care Patient           Patient will benefit from skilled therapeutic intervention in order to improve the following deficits and impairments:  Abnormal gait,Decreased range of motion,Decreased activity tolerance,Pain,Impaired flexibility,Improper body mechanics,Decreased balance,Decreased mobility,Decreased strength,Postural dysfunction  Visit Diagnosis: Pain in right hip  Difficulty in walking, not elsewhere classified     Problem List Patient Active Problem List   Diagnosis Date Noted  . Arthritis of left knee 06/30/2019  . Arthritis of right shoulder region 09/24/2016  . Arthritis of shoulder region, left 02/22/2015  . Type II or unspecified type diabetes mellitus without  mention of complication, not stated as uncontrolled 02/11/2012  . Hypogonadism male 02/11/2012  . Arthritis of knee, right 10/30/2011    Percival Spanish 12/13/2020, 5:56 PM  [facility] [address]  [place], [place], [address] Phone: [phone]   Fax:  [phone]  Name: BERK PILOT MRN: [id number] Date of Birth: May 06, 1934

## 2020-12-14 ENCOUNTER — Ambulatory Visit: Payer: Medicare PPO | Admitting: Orthopedic Surgery

## 2020-12-21 ENCOUNTER — Other Ambulatory Visit: Payer: Self-pay

## 2020-12-21 ENCOUNTER — Ambulatory Visit: Payer: Medicare PPO

## 2020-12-21 DIAGNOSIS — M25551 Pain in right hip: Secondary | ICD-10-CM | POA: Diagnosis not present

## 2020-12-21 DIAGNOSIS — R262 Difficulty in walking, not elsewhere classified: Secondary | ICD-10-CM

## 2020-12-21 NOTE — Therapy (Addendum)
[facility] [address]  [place], [place], [address] Phone: [phone]   Fax:  [phone]  Physical Therapy Treatment / Discharge Summary  Patient Details  Name: [name] Bell MRN: [id number] Date of Birth: July 02, 1934 Referring Provider (PT): Magnant   Encounter Date: 12/21/2020   PT End of Session - 12/21/20 1400    Visit Number 3    Date for PT Re-Evaluation 02/06/21    Authorization Type [employer]    Authorization Time Period 12/07/20 - 02/06/21    Authorization - Visit Number 2   Authorization - Number of Visits 12    PT Start Time 0300    PT Stop Time 1358    PT Time Calculation (min) 43 min    Activity Tolerance Patient tolerated treatment well    Behavior During Therapy [facility] for tasks assessed/performed           Past Medical History:  Diagnosis Date  . Allergy    RHINITIS  . Ambulates with cane   . Anxiety   . Arthritis    osteoarthritis  . Asthmatic bronchitis    seasonal  . Cancer ([place])    skin cancer removed from back  . Depression   . Diabetes mellitus   . FHx: cardiovascular disease   . History of kidney stones   . History of pneumonia    reports d/t bronchitis  . Hx of colonic polyps   . Hypogonadism male   . 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 Pneumonia 2019  . PONV (postoperative nausea and vomiting)    reports has a difficult time coming out of ( knee surgery)  . Seborrheic keratosis     Past Surgical History:  Procedure Laterality Date  . CHOLECYSTECTOMY  2010   GALL BLADDER  . HERNIA REPAIR     during cholecystectomy  . KNEE ARTHROPLASTY  10/30/2011   Procedure: COMPUTER ASSISTED TOTAL KNEE ARTHROPLASTY;  Surgeon: Meredith Pel, MD;  Location: [place];  Service: Orthopedics;  Laterality: Right;  right total knee arthroplasty  . KNEE CARTILAGE SURGERY  2009   Right knee  . PARTIAL GLOSSECTOMY  2017   at [facility]  . SHOULDER ARTHROSCOPY Right   . TOTAL KNEE  ARTHROPLASTY Left 06/30/2019   Procedure: LEFT TOTAL KNEE ARTHROPLASTY, CEMENTED;  Surgeon: Meredith Pel, MD;  Location: [place];  Service: Orthopedics;  Laterality: Left;  . TOTAL SHOULDER ARTHROPLASTY Left 02/22/2015  . TOTAL SHOULDER ARTHROPLASTY Left 02/22/2015   Procedure: LEFT TOTAL SHOULDER ARTHROPLASTY;  Surgeon: Meredith Pel, MD;  Location: [place];  Service: Orthopedics;  Laterality: Left;    There were no vitals filed for this visit.   Subjective Assessment - 12/21/20 1320    Subjective Pt has been in a lot of pain in his hip has not been able to do his exercises.    Pertinent History anxiety, depression, DM, oral cancer    Patient Stated Goals have less pain, walk better, be stronger, better and easier function    Currently in Pain? No/denies                             [facility] Adult PT Treatment/Exercise - 12/21/20 0001      Ambulation/Gait   Ambulation Distance (Feet) 90 Feet    Assistive device Straight cane    Gait Pattern Step-through pattern;Decreased weight shift to right;Antalgic;Decreased stance time - right;Decreased step  length - left;Decreased step length - right      Exercises   Exercises Knee/Hip      Knee/Hip Exercises: Stretches   ITB Stretch Right;1 rep;30 seconds    ITB Stretch Limitations supine crossbody with strap    Other Knee/Hip Stretches hip abduction stretch 30 sec      Knee/Hip Exercises: Aerobic   Nustep L2 x 6 min      Knee/Hip Exercises: Standing   Hip Flexion Stengthening;Both;1 set;10 reps;Knee bent    Hip Flexion Limitations 2 HHA at counter;    Hip Abduction Stengthening;Both;1 set;10 reps;Knee straight    Abduction Limitations 2 HHA at counter; cues to keep feet pointed foward    Hip Extension Stengthening;Both;1 set;10 reps    Extension Limitations 2 HHA at counter; cues to keep from leaning foward with trunk      Knee/Hip Exercises: Seated   Sit to Sand 2 sets;10 reps;without UE support   cues for ant  weight shift and full hip extension at top     Knee/Hip Exercises: Supine   Bridges Strengthening;Both;2 sets;10 reps    Bridges Limitations tactile cues for even WB on L side    Straight Leg Raises Strengthening;Right;1 set;15 reps    Straight Leg Raises Limitations 2 sec hold; cues to keep knee straight                    PT Short Term Goals - 12/13/20 1544      PT SHORT TERM GOAL #1   Title Patient will be independent with initial HEP    Time 2    Period Weeks    Status On-going             PT Long Term Goals - 12/13/20 1544      PT LONG TERM GOAL #1   Title Patient will be independent with advanced/ongoing HEP    Time 8    Period Weeks    Status On-going    Target Date 02/06/21      PT LONG TERM GOAL #2   Title decrease TUG time to 15 seconds    Time 8    Period Weeks    Status On-going    Target Date 02/06/21      PT LONG TERM GOAL #3   Title L hip and knee strength >/= 4+/5    Time 8    Period Weeks    Status On-going    Target Date 02/06/21      PT LONG TERM GOAL #4   Title will report no difficulty dressing    Time 8    Period Weeks    Status On-going    Target Date 02/06/21      PT LONG TERM GOAL #5   Title Patient will safely navigate full flight of stairs with single rail support to allow safe access to basement in home without pain    Time 8    Period Weeks    Status On-going    Target Date 02/06/21                 Plan - 12/21/20 1401    Clinical Impression Statement Pt reported that he has been doing better. Cues was required for correcting posture and preventing compensatory movements during exercises, specifics noted in flowsheet. He noted that he hasn't been completing his exercises at home as frequently as he would like d/t some flare ups with pain but plans to meet with  doctor about this. He had no c/o pain with any exercises during session and overall responded well.    Personal Factors and Comorbidities Comorbidity  3+    Comorbidities anxiety, depression, DM, oral CA, TKA, shoulder surgeries    PT Frequency 1x / week    PT Duration 8 weeks    PT Treatment/Interventions ADLs/Self Care Home Management;Cryotherapy;Electrical Stimulation;Iontophoresis 32m/ml Dexamethasone;Moist Heat;Gait training;Balance training;Therapeutic exercise;Therapeutic activities;Functional mobility training;Stair training;Patient/family education;Manual techniques    PT Next Visit Plan slowly work on LE strength and flexibility, functional gait and balance, potential trial of ionto patch pending control of DM    PT Home Exercise Plan MedBridge Access Code: [email or web address] (3/2); [email or web address](3/8)    Consulted and Agree with Plan of Care Patient           Patient will benefit from skilled therapeutic intervention in order to improve the following deficits and impairments:  Abnormal gait,Decreased range of motion,Decreased activity tolerance,Pain,Impaired flexibility,Improper body mechanics,Decreased balance,Decreased mobility,Decreased strength,Postural dysfunction  Visit Diagnosis: Pain in right hip  Difficulty in walking, not elsewhere classified     Problem List Patient Active Problem List   Diagnosis Date Noted  . Arthritis of left knee 06/30/2019  . Arthritis of right shoulder region 09/24/2016  . Arthritis of shoulder region, left 02/22/2015  . Type II or unspecified type diabetes mellitus without mention of complication, not stated as uncontrolled 02/11/2012  . Hypogonadism male 02/11/2012  . Arthritis of knee, right 10/30/2011    BArtist Pais PTA 12/21/2020, 2:56 PM  [facility] [place] [address]Phone: [phone]  Fax:  [phone] Name: [name] MEUNIERMRN: [id number] of Birth: 109/07/1934 PHYSICAL THERAPY DISCHARGE SUMMARY  Visits from Start of Care: 3  Current functional level related to goals / functional  outcomes:   Refer to above clinical impression for status as of last visit on 12/21/2020. Patient failed to schedule any further visits and when contacted regarding scheduling, he stated he would call if he wanted to come back. He was placed on hold for 30 days and has not returned to PT, therefore will proceed with discharge from PT for this episode.   Remaining deficits:   As above. Unable to formally assess status at discharge due to failure to return to PT.   Education / Equipment:   HEP  Plan: Patient agrees to discharge.  Patient goals were partially met. Patient is being discharged due to meeting the stated rehab goals.  ?????     JPercival Spanish PT, MPT 02/23/21, 10:38 AM  [facility] [place] [address]Phone: [phone]  Fax:  [phone]

## 2021-01-05 ENCOUNTER — Encounter: Payer: Self-pay | Admitting: Neurology

## 2021-01-05 ENCOUNTER — Ambulatory Visit: Payer: Medicare PPO | Admitting: Neurology

## 2021-01-05 VITALS — BP 135/76 | HR 90 | Ht 64.0 in | Wt 176.0 lb

## 2021-01-05 DIAGNOSIS — R269 Unspecified abnormalities of gait and mobility: Secondary | ICD-10-CM | POA: Diagnosis not present

## 2021-01-05 DIAGNOSIS — E1142 Type 2 diabetes mellitus with diabetic polyneuropathy: Secondary | ICD-10-CM | POA: Diagnosis not present

## 2021-01-05 DIAGNOSIS — R251 Tremor, unspecified: Secondary | ICD-10-CM | POA: Diagnosis not present

## 2021-01-05 NOTE — Progress Notes (Signed)
Chief Complaint  Patient presents with  . New Patient (Initial Visit)  . Abstract    Reports bilateral hand tremors that especially makes it difficult for him to eat. His walking has changed due to right leg pain. He is under the care of Dr. Marlou Sa in orthopaedics. He typically uses a cane to assist with ambulation. Hx of bilateral knee replacements.      ASSESSMENT AND PLAN  [name] Bell is a 87 y.o. male   Essential tremor  Fairly symmetric, no parkinsonian features  Laboratory evaluation, TSH  Diabetic peripheral neuropathy, diabetes was not under optimal control, glucose was 481 on November 29, 2018 Gait abnormality  Likely combination of diabetic peripheral neuropathy and lumbar radiculopathy  Is receiving physical therapy   DIAGNOSTIC DATA (LABS, IMAGING, TESTING) - I reviewed patient records, labs, notes, testing and imaging myself where available.  Laboratory evaluation 2022: Normal CBC, hemoglobin of 15.4, BMP, glucose of 481, creatinine 1.34, B12 985,  CT abdomen at [facility] June 2021, fatty mild cirrhotic appearing liver, evidence of pyuria cholecystectomy, sigmoid diverticulosis, marked severe degenerative changes at L4-5 L5-S1   HISTORICAL  [name] Bell is a 87 year old male, seen in request by his primary care physician Dr. Percell Miller, Jenny Reichmann for evaluation of tremor, gait abnormality, initial evaluation was on January 05, 2021  I reviewed and summarized the referring note.  Past medical history Diabetes, poorly controlled glucose was 481 in February 2022 Hyperlipidemia Bilateral knee replace, bilateral shoulder tremor.  He had a history of bilateral knee replacement, bilateral shoulder replacement, complains of chronic low back pain, gradual onset gait abnormality,  Noticed slow worsening hand tremor since 2021, initially noticed that right dominant hand, described difficulty writing, signing checks, occasional involvement of left hand, is more of a nuisance  for him, he has no significant limitations from it, he lives alone, still driving,  He denies family history of tremor, denied loss sense of smell, has difficulty sleeping due to pain, denies REM sleep disorder   REVIEW OF SYSTEMS: Full 14 system review of systems performed and notable only for as above All other review of systems were negative.  PHYSICAL EXAM   Vitals:   01/05/21 1413  BP: 135/76  Pulse: 90  Weight: 176 lb (79.8 kg)  Height: 5\' 4"  (1.626 m)   Not recorded     Body mass index is 30.21 kg/m.  PHYSICAL EXAMNIATION:  Gen: NAD, conversant, well nourised, well groomed                     Cardiovascular: Regular rate rhythm, no peripheral edema, warm, nontender. Eyes: Conjunctivae clear without exudates or hemorrhage Neck: Supple, no carotid bruits. Pulmonary: Clear to auscultation bilaterally   NEUROLOGICAL EXAM:  MENTAL STATUS: Speech:    Speech is normal; fluent and spontaneous with normal comprehension.  Cognition:     Orientation to time, place and person     Normal recent and remote memory     Normal Attention span and concentration     Normal Language, naming, repeating,spontaneous speech     Fund of knowledge   CRANIAL NERVES: CN II: Visual fields are full to confrontation. Pupils are round equal and briskly reactive to light. CN III, IV, VI: extraocular movement are normal. No ptosis. CN V: Facial sensation is intact to light touch CN VII: Face is symmetric with normal eye closure  CN VIII: Hearing is normal to causal conversation. CN IX, X: Phonation is normal. CN XI:  Head turning and shoulder shrug are intact  MOTOR: He has mild right ankle dorsiflexion, mild right toe flexion/extension weakness  REFLEXES: Reflexes are 1 and symmetric at the biceps, triceps, absent at knees, and ankles. Plantar responses are flexor.  SENSORY: Length dependent decreased light touch, vibratory sensation to bilateral knee level  COORDINATION: There is  no trunk or limb dysmetria noted.  GAIT/STANCE: Need push-up to get up from seated position, mildly unsteady, difficulty standing up on tiptoe and heels  ALLERGIES: Allergies  Allergen Reactions  . Thorazine [Chlorpromazine] Other (See Comments)    Hallucinations. Patient does not want to take anymore.  Ignacia Bayley Antibiotics Hives    HOME MEDICATIONS: Current Outpatient Medications  Medication Sig Dispense Refill  . aspirin 81 MG chewable tablet Chew 1 tablet (81 mg total) by mouth 2 (two) times daily. 30 tablet 0  . BETA CAROTENE PO Take 7,500 mcg by mouth daily.    . Chromium 200 MCG TABS Take 200 mcg by mouth daily.    . Cinnamon 500 MG TABS Take 500 mg by mouth every evening.     . docusate sodium (COLACE) 100 MG capsule Take 1 capsule (100 mg total) by mouth 2 (two) times daily. 10 capsule 0  . HUMULIN N 100 UNIT/ML injection Inject 30 Units into the skin 2 (two) times daily.    . Lancets (ONETOUCH ULTRASOFT) lancets USE TO CHECK BLOOD SUGAR TWICE DAILY 100 each 2  . liraglutide (VICTOZA) 18 MG/3ML SOPN Inject 1.8 mg into the skin daily.     . Magnesium 250 MG TABS Take 500 mg by mouth daily.    . metFORMIN (GLUCOPHAGE-XR) 500 MG 24 hr tablet Take 500 mg by mouth 2 (two) times daily.     . Multiple Vitamins-Minerals (MULTIVITAMIN WITH MINERALS) tablet Take 1 tablet by mouth daily.    . Naphazoline HCl (CLEAR EYES OP) Place 1 drop into both eyes daily as needed (dry eyes).    . ONE TOUCH ULTRA TEST test strip USE AS DIRECTED TWICE DAILY 100 each 11  . oxyCODONE (OXY IR/ROXICODONE) 5 MG immediate release tablet Take 1 tablet (5 mg total) by mouth every 6 (six) hours as needed for severe pain. 30 tablet 0  . oxyCODONE-acetaminophen (PERCOCET) 10-325 MG tablet Take 0.5 tablets by mouth every 8 (eight) hours as needed. for pain 35 tablet 0  . Potassium 99 MG TABS Take 99 mg by mouth daily.    . pravastatin (PRAVACHOL) 20 MG tablet Take 20 mg by mouth every evening.     Marland Kitchen tiZANidine  (ZANAFLEX) 2 MG tablet TAKE ONE TABLET BY MOUTH  EVERY 8 HOURS AS NEEDED 30 tablet 0  . TURMERIC PO Take 1 tablet by mouth 2 (two) times daily.     . vitamin B-12 (CYANOCOBALAMIN) 1000 MCG tablet Take 1,000 mcg by mouth daily.      No current facility-administered medications for this visit.    PAST MEDICAL HISTORY: Past Medical History:  Diagnosis Date  . Allergy    RHINITIS  . Ambulates with cane   . Anxiety   . Arthritis    osteoarthritis  . Asthmatic bronchitis    seasonal  . Cancer ([place])    skin cancer removed from back  . Depression   . Diabetes mellitus   . FHx: cardiovascular disease   . History of kidney stones   . History of pneumonia    reports d/t bronchitis  . Hx of colonic polyps   . Hypogonadism male   .  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 Pneumonia 2019  . PONV (postoperative nausea and vomiting)    reports has a difficult time coming out of ( knee surgery)  . Seborrheic keratosis   . Tremor     PAST SURGICAL HISTORY: Past Surgical History:  Procedure Laterality Date  . CHOLECYSTECTOMY  2010   GALL BLADDER  . HERNIA REPAIR     during cholecystectomy  . KNEE ARTHROPLASTY  10/30/2011   Procedure: COMPUTER ASSISTED TOTAL KNEE ARTHROPLASTY;  Surgeon: Meredith Pel, MD;  Location: [facility];  Service: Orthopedics;  Laterality: Right;  right total knee arthroplasty  . KNEE CARTILAGE SURGERY  2009   Right knee  . PARTIAL GLOSSECTOMY  2017   at [facility]  . SHOULDER ARTHROSCOPY Right   . TOTAL KNEE ARTHROPLASTY Left 06/30/2019   Procedure: LEFT TOTAL KNEE ARTHROPLASTY, CEMENTED;  Surgeon: Meredith Pel, MD;  Location: [place];  Service: Orthopedics;  Laterality: Left;  . TOTAL SHOULDER ARTHROPLASTY Left 02/22/2015  . TOTAL SHOULDER ARTHROPLASTY Left 02/22/2015   Procedure: LEFT TOTAL SHOULDER ARTHROPLASTY;  Surgeon: Meredith Pel, MD;  Location: [facility];  Service: Orthopedics;  Laterality: Left;    FAMILY HISTORY: Family History  Problem Relation Age of  Onset  . Heart failure Mother        age 17  . Heart attack Father        age 65    SOCIAL HISTORY: Social History   Socioeconomic History  . Marital status: Single    Spouse name: Not on file  . Number of children: 0  . Years of education: college  . Highest education level: Master's degree (e.g., MA, MS, MEng, MEd, MSW, MBA)  Occupational History  . Occupation: Retired  Tobacco Use  . Smoking status: Never Smoker  . Smokeless tobacco: Never Used  Vaping Use  . Vaping Use: Never used  Substance and Sexual Activity  . Alcohol use: Yes    Alcohol/week: 3.0 standard drinks    Types: 3 Glasses of wine per week    Comment: occasional  . Drug use: Never  . Sexual activity: Not Currently  Other Topics Concern  . Not on file  Social History Narrative   Lives at home with his significant other, Martin Majestic.   Right-handed.   1-2 cups caffeine per day.   Social Determinants of Health   Financial Resource Strain: Not on file  Food Insecurity: Not on file  Transportation Needs: Not on file  Physical Activity: Not on file  Stress: Not on file  Social Connections: Not on file  Intimate Partner Violence: Not on file      Marcial Pacas, M.D. Ph.D.  [facility] [address] [place], [place] [address] Ph: [phone] Fax: [phone]  CC:  Governor Specking, MD [address] [place],  [place] [address]  Governor Specking, MD

## 2021-01-06 ENCOUNTER — Telehealth: Payer: Self-pay | Admitting: Neurology

## 2021-01-06 LAB — TSH: TSH: 0.419 u[IU]/mL — ABNORMAL LOW (ref 0.450–4.500)

## 2021-01-06 LAB — CK: Total CK: 63 U/L (ref 30–208)

## 2021-01-06 LAB — HGB A1C W/O EAG: Hgb A1c MFr Bld: 11.7 % — ABNORMAL HIGH (ref 4.8–5.6)

## 2021-01-06 NOTE — Telephone Encounter (Signed)
I called the patient and he verbalized understanding of the lab results. He will schedule a follow up with his PCP for further evaluation.

## 2021-01-06 NOTE — Telephone Encounter (Signed)
Please call patient, laboratory evaluation showed significantly elevated A1c 11.7, mildly decreased TSH 0.42,  I have forwarded laboratory evaluation to his primary care physician Governor Specking, MD, he should contact Dr. Percell Miller for better control of his diabetes, further evaluation of thyroid function

## 2021-01-19 ENCOUNTER — Telehealth: Payer: Self-pay | Admitting: Orthopedic Surgery

## 2021-01-19 ENCOUNTER — Ambulatory Visit: Payer: Medicare PPO | Admitting: Orthopedic Surgery

## 2021-01-19 NOTE — Telephone Encounter (Signed)
Pt called and states he was running behind this morning and couldn't make it by 10:45 so he is wondering if he could please be worked in next week after 11. CB [phone]

## 2021-01-23 NOTE — Telephone Encounter (Signed)
Appt scheduled

## 2021-01-23 NOTE — Telephone Encounter (Signed)
Tried calling. No answer. No VM.

## 2021-01-25 ENCOUNTER — Ambulatory Visit: Payer: Medicare PPO | Admitting: Orthopedic Surgery

## 2021-01-25 ENCOUNTER — Other Ambulatory Visit: Payer: Self-pay

## 2021-01-25 DIAGNOSIS — M79604 Pain in right leg: Secondary | ICD-10-CM | POA: Diagnosis not present

## 2021-01-29 ENCOUNTER — Encounter: Payer: Self-pay | Admitting: Orthopedic Surgery

## 2021-01-29 NOTE — Progress Notes (Signed)
Office Visit Note   Patient: [name] Bell           Date of Birth: [birth year]           MRN: [id number] Visit Date: 01/25/2021 Requested by: Governor Specking, MD [address] [place],  [place] [address] PCP: Governor Specking, MD  Subjective: Chief Complaint  Patient presents with  . Right Leg - Pain  . Right Hip - Pain    HPI: [name] Bell is a patient with right hip and leg pain on and off since the fall 2021.  Describes radicular pain with low back pain radiating down the leg along with buttock pain.  He has been going to physical therapy.  He actually went to the hospital and had very bad pain when he was there in [facility]  Takes pain meds.  The pain does wake him from sleep at night.  Therapy did help.  He is not taking any blood thinners but does take ibuprofen and tramadol.              ROS: All systems reviewed are negative as they relate to the chief complaint within the history of present illness.  Patient denies  fevers or chills.   Assessment & Plan: Visit Diagnoses:  1. Pain in right leg     Plan: Impression is right hip and leg pain.  This looks like radicular pain from the back.  I think he would do well with MRI scan and epidural steroid since he has failed conservative management.  MRI scan arranged and follow-up after that study for further evaluation and management.  I did discuss with Jhonathan though that we could call him with those results and then set up a plan as well which I think would be more convenient for him since he [place] on the [place] side of [place].  Follow-Up Instructions: No follow-ups on file.   Orders:  Orders Placed This Encounter  Procedures  . MR Lumbar Spine w/o contrast   No orders of the defined types were placed in this encounter.     Procedures: No procedures performed   Clinical Data: No additional findings.  Objective: Vital Signs: There were no vitals taken for this visit.  Physical Exam:   Constitutional: Patient appears  well-developed HEENT:  Head: Normocephalic Eyes:EOM are normal Neck: Normal range of motion Cardiovascular: Normal rate Pulmonary/chest: Effort normal Neurologic: Patient is alert Skin: Skin is warm Psychiatric: Patient has normal mood and affect    Ortho Exam: Ortho exam demonstrates normal gait alignment with positive nerve root tension signs on the right negative on the left.  Patient has 5 out of 5 ankle dorsiflexion plantarflexion quad hamstring strength.  No groin pain with internal ex rotation of the leg.  No clear-cut paresthesias L1 S1 bilaterally.  Pedal pulses palpable.  Leg lengths approximately equal.  Mild pain with forward lateral bending.  Specialty Comments:  No specialty comments available.  Imaging: No results found.   PMFS History: Patient Active Problem List   Diagnosis Date Noted  . DM type 2 with diabetic peripheral neuropathy ([place]) 01/05/2021  . Tremor 01/05/2021  . Gait abnormality 01/05/2021  . Arthritis of left knee 06/30/2019  . Arthritis of right shoulder region 09/24/2016  . Arthritis of shoulder region, left 02/22/2015  . Type II or unspecified type diabetes mellitus without mention of complication, not stated as uncontrolled 02/11/2012  . Hypogonadism male 02/11/2012  . Arthritis of knee, right 10/30/2011  Past Medical History:  Diagnosis Date  . Allergy    RHINITIS  . Ambulates with cane   . Anxiety   . Arthritis    osteoarthritis  . Asthmatic bronchitis    seasonal  . Cancer ([place])    skin cancer removed from back  . Depression   . Diabetes mellitus   . FHx: cardiovascular disease   . History of kidney stones   . History of pneumonia    reports d/t bronchitis  . Hx of colonic polyps   . Hypogonadism male   . 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 Pneumonia 2019  . PONV (postoperative nausea and vomiting)    reports has a difficult time coming out of ( knee surgery)  . Seborrheic keratosis   . Tremor     Family History  Problem  Relation Age of Onset  . Heart failure Mother        age 26  . Heart attack Father        age 64    Past Surgical History:  Procedure Laterality Date  . CHOLECYSTECTOMY  2010   GALL BLADDER  . HERNIA REPAIR     during cholecystectomy  . KNEE ARTHROPLASTY  10/30/2011   Procedure: COMPUTER ASSISTED TOTAL KNEE ARTHROPLASTY;  Surgeon: Meredith Pel, MD;  Location: [place] [facility];  Service: Orthopedics;  Laterality: Right;  right total knee arthroplasty  . KNEE CARTILAGE SURGERY  2009   Right knee  . PARTIAL GLOSSECTOMY  2017   at [facility]  . SHOULDER ARTHROSCOPY Right   . TOTAL KNEE ARTHROPLASTY Left 06/30/2019   Procedure: LEFT TOTAL KNEE ARTHROPLASTY, CEMENTED;  Surgeon: Meredith Pel, MD;  Location: [place];  Service: Orthopedics;  Laterality: Left;  . TOTAL SHOULDER ARTHROPLASTY Left 02/22/2015  . TOTAL SHOULDER ARTHROPLASTY Left 02/22/2015   Procedure: LEFT TOTAL SHOULDER ARTHROPLASTY;  Surgeon: Meredith Pel, MD;  Location: [place];  Service: Orthopedics;  Laterality: Left;   Social History   Occupational History  . Occupation: Retired  Tobacco Use  . Smoking status: Never Smoker  . Smokeless tobacco: Never Used  Vaping Use  . Vaping Use: Never used  Substance and Sexual Activity  . Alcohol use: Yes    Alcohol/week: 3.0 standard drinks    Types: 3 Glasses of wine per week    Comment: occasional  . Drug use: Never  . Sexual activity: Not Currently

## 2021-02-16 ENCOUNTER — Telehealth: Payer: Self-pay | Admitting: Orthopedic Surgery

## 2021-02-16 NOTE — Telephone Encounter (Signed)
Spoke with patient he is not sure where he is going for his MRI on Saturday. The number to contact patient is [phone]

## 2021-02-17 NOTE — Telephone Encounter (Signed)
Pt aware of appt.

## 2021-02-18 ENCOUNTER — Other Ambulatory Visit: Payer: Self-pay

## 2021-02-18 ENCOUNTER — Ambulatory Visit (HOSPITAL_BASED_OUTPATIENT_CLINIC_OR_DEPARTMENT_OTHER)
Admission: RE | Admit: 2021-02-18 | Discharge: 2021-02-18 | Disposition: A | Discharge status: Home / Self Care | Payer: Medicare PPO | Source: Ambulatory Visit | Attending: Orthopedic Surgery | Admitting: Orthopedic Surgery

## 2021-02-18 DIAGNOSIS — M79604 Pain in right leg: Secondary | ICD-10-CM | POA: Insufficient documentation

## 2021-02-20 NOTE — Progress Notes (Signed)
Hi Lauren.  I talked [place].  Can you please get him set up in [facility] for lumbar ESI for right-sided L4-5 foraminal stenosis thanks

## 2021-02-27 ENCOUNTER — Other Ambulatory Visit: Payer: Self-pay | Admitting: Radiology

## 2021-02-27 DIAGNOSIS — M79604 Pain in right leg: Secondary | ICD-10-CM

## 2021-03-21 ENCOUNTER — Ambulatory Visit (INDEPENDENT_AMBULATORY_CARE_PROVIDER_SITE_OTHER): Payer: Medicare PPO | Admitting: Physical Medicine and Rehabilitation

## 2021-03-21 ENCOUNTER — Ambulatory Visit: Payer: Self-pay

## 2021-03-21 ENCOUNTER — Encounter: Payer: Self-pay | Admitting: Physical Medicine and Rehabilitation

## 2021-03-21 ENCOUNTER — Other Ambulatory Visit: Payer: Self-pay

## 2021-03-21 VITALS — BP 119/81 | HR 87

## 2021-03-21 DIAGNOSIS — M5136 Other intervertebral disc degeneration, lumbar region: Secondary | ICD-10-CM

## 2021-03-21 DIAGNOSIS — M47816 Spondylosis without myelopathy or radiculopathy, lumbar region: Secondary | ICD-10-CM | POA: Diagnosis not present

## 2021-03-21 DIAGNOSIS — M5416 Radiculopathy, lumbar region: Secondary | ICD-10-CM | POA: Diagnosis not present

## 2021-03-21 MED ORDER — METHYLPREDNISOLONE ACETATE 80 MG/ML IJ SUSP
80.0000 mg | Freq: Once | INTRAMUSCULAR | Status: AC
  Administered 2021-03-21: 80 mg

## 2021-03-21 NOTE — Progress Notes (Signed)
Pt state lower back pain that travels to his right hip and thigh. Pt state walking and laying makes the pain worse. Pt state he takes pain meds to help ease his pain.  Numeric Pain Rating Scale and Functional Assessment Average Pain 2   In the last MONTH (on 0-10 scale) has pain interfered with the following?  1. General activity like being  able to carry out your everyday physical activities such as walking, climbing stairs, carrying groceries, or moving a chair?  Rating(8)   +Driver, -BT, -Dye Allergies.

## 2021-03-22 ENCOUNTER — Encounter: Payer: Self-pay | Admitting: Physical Medicine and Rehabilitation

## 2021-03-22 NOTE — Progress Notes (Signed)
BRIXTON FRANKO - 87 y.o. male MRN [id number]  Date of birth: [birth year]  Office Visit Note: Visit Date: 03/21/2021 PCP: Governor Specking, MD Referred by: Governor Specking, MD  Subjective: Chief Complaint  Patient presents with   Lower Back - Pain   Right Hip - Pain   Right Thigh - Pain   HPI: [name] Bell is a 87 y.o. male who comes in today At the request of G. Alphonzo Severance, MD for evaluation and management of chronic worsening low back pain and right hip and thigh pain.  We actually had the patient potentially scheduled for epidural injection given the MRI findings.  However the patient comes in today stating a lot of the pain in the hip and thigh has actually regressed quite nicely.  He would designate his pain right now is a 2 out of 10.  He has had some mobility issues in general and does report some problems with activities of daily living.  The brief history is that sometime at the first of the year he began having severe low back and right hip and thigh pain consistent more with an L4-L5 radicular type pattern versus trochanteric pattern.  The patient's history is complicated by diabetes with last hemoglobin A1c of 11.7 in March.  He initially was treated by his primary physician and then he saw Dr. Marlou Sa for evaluation.  He had previously seen Dr. Marlou Sa for left total knee replacement in 2020.  He was started with conservative care including medication management and activity changes with reduction in exercise activity and was sent to physical therapy at [facility].  He did complete physical therapy which seemed to help a little bit along with the medications but it was a slow process he said with very severe pain at times particularly with standing and ambulating.  Walking and then the biggest issue.  He reports over the last several weeks however the pain has really become tolerable to the point where he was really unsure about coming in today.  He still has pain in the same type  pattern but not much in the thigh at all at this point.  No left-sided complaints.  No focal weakness.  No red flag symptoms.  No prior history of lumbar surgery no prior history of lumbar injections.  Review of Systems  Musculoskeletal:  Positive for back pain and joint pain.  All other systems reviewed and are negative. Otherwise per HPI.  Assessment & Plan: Visit Diagnoses:    ICD-10-CM   1. Lumbar radiculopathy  M54.16 methylPREDNISolone acetate (DEPO-MEDROL) injection 80 mg    CANCELED: XR C-ARM NO REPORT    CANCELED: Epidural Steroid injection    2. Spondylosis without myelopathy or radiculopathy, lumbar region  M47.816     3. Other intervertebral disc degeneration, lumbar region  M51.36        Plan: Findings:  Continued chronic low back pain and right hip pain but significantly improved over the last several weeks after several months of severe pain.  He does have MRI findings of facet arthritis particularly at L4-5 with spurring on the right impacting the L4 and L5 nerve roots without high-grade compression.  No central stenosis.  He has some lateral recess narrowing at L5-S1.  Any of those could cause his symptoms and likely he did have nerve root irritation given the amount of pain that he had.  This seems to be calm down at this point at least to the point  where it is tolerable.  I think he may have a flareup at some point we discussed what he should do at that point and obviously would call us.  We did go over potential injection type therapy including epidural injection and facet joint blocks.  We spent 15 minutes talking with him today about his condition.  He will start up with exercises that he used to do including bicycle and potentially getting into the pool.  He can start slowly and try to get his feet wet with that.  Hopefully he will do fine and really not need to have any sort of interventions.   Meds & Orders:  Meds ordered this encounter  Medications    methylPREDNISolone acetate (DEPO-MEDROL) injection 80 mg   No orders of the defined types were placed in this encounter.   Follow-up: No follow-ups on file.   Procedures: No procedures performed      Clinical History: MRI LUMBAR SPINE WITHOUT CONTRAST   TECHNIQUE: Multiplanar, multisequence MR imaging of the lumbar spine was performed. No intravenous contrast was administered.   COMPARISON:  Lumbar radiographs 11/16/2020.  Lumbar MRI 01/23/2011   FINDINGS: Segmentation:  Normal   Alignment: Slight retrolisthesis L1-2. Slight anterolisthesis L2-3 has improved in the interval.   Vertebrae: Negative for fracture or mass. Progressive fatty endplate changes at Q2-5 due to progressive disc degeneration since the prior study.   Conus medullaris and cauda equina: Conus extends to the L1-2 level. Conus and cauda equina appear normal.   Paraspinal and other soft tissues: Negative for paraspinous mass or adenopathy   Disc levels:   L1-2: Disc degeneration with diffuse disc bulging and bilateral facet hypertrophy. No significant stenosis   L2-3: Slight anterolisthesis. Mild disc bulging and mild facet degeneration. No significant stenosis   L3-4: Disc degeneration with disc bulging. Shallow left extraforaminal disc protrusion unchanged from the prior study. Mild facet degeneration. Mild subarticular and foraminal stenosis bilaterally   L4-5: Advanced disc degeneration has progressed in the interval. Diffuse endplate spurring is present right greater than left. Right L4 and L5 nerve root impingement due to spurring. Less prominent left L4 and L5 nerve root impingement. Mild spinal stenosis   L5-S1: Diffuse disc bulging and endplate spurring. Bilateral facet hypertrophy. Moderate subarticular stenosis bilaterally.   IMPRESSION: Multilevel disc and facet degeneration.   Right L4 and L5 nerve root impingement at L4-5 due to prominent endplate spurring and facet  hypertrophy.     Electronically Signed   By: Franchot Gallo M.D.   On: 02/20/2021 12:02   He reports that he has never smoked. He has never used smokeless tobacco.  Recent Labs    01/05/21 1507  HGBA1C 11.7*    Objective:  VS:  HT:    WT:   BMI:     BP:119/81  HR:87bpm  TEMP: ( )  RESP:  Physical Exam Vitals and nursing note reviewed.  Constitutional:      General: He is not in acute distress.    Appearance: Normal appearance. He is not ill-appearing.  HENT:     Head: Normocephalic and atraumatic.     Right Ear: External ear normal.     Left Ear: External ear normal.     Nose: No congestion.  Eyes:     Extraocular Movements: Extraocular movements intact.  Cardiovascular:     Rate and Rhythm: Normal rate.     Pulses: Normal pulses.  Pulmonary:     Effort: Pulmonary effort is normal. No respiratory distress.  Abdominal:     General: There is no distension.     Palpations: Abdomen is soft.  Musculoskeletal:        General: No tenderness or signs of injury.     Cervical back: Neck supple.     Right lower leg: No edema.     Left lower leg: No edema.     Comments: Patient has good distal strength without clonus.  He ambulates with a somewhat of a shuffling gait.  He does have concordant back pain with extension and facet loading.  He has no trigger points.  He has no pain with hip rotation.  He has no pain over the greater trochanters.  Skin:    Findings: No erythema or rash.  Neurological:     General: No focal deficit present.     Mental Status: He is alert and oriented to person, place, and time.     Sensory: No sensory deficit.     Motor: No weakness or abnormal muscle tone.     Coordination: Coordination normal.  Psychiatric:        Mood and Affect: Mood normal.        Behavior: Behavior normal.    Ortho Exam  Imaging: No results found.  Past Medical/Family/Surgical/Social History: Medications & Allergies reviewed per EMR, new medications  updated. Patient Active Problem List   Diagnosis Date Noted   DM type 2 with diabetic peripheral neuropathy ([place]) 01/05/2021   Tremor 01/05/2021   Gait abnormality 01/05/2021   Arthritis of left knee 06/30/2019   Arthritis of right shoulder region 09/24/2016   Arthritis of shoulder region, left 02/22/2015   Type II or unspecified type diabetes mellitus without mention of complication, not stated as uncontrolled 02/11/2012   Hypogonadism male 02/11/2012   Arthritis of knee, right 10/30/2011   Past Medical History:  Diagnosis Date   Allergy    RHINITIS   Ambulates with cane    Anxiety    Arthritis    osteoarthritis   Asthmatic bronchitis    seasonal   Cancer (HCC)    skin cancer removed from back   Depression    Diabetes mellitus    FHx: cardiovascular disease    History of kidney stones    History of pneumonia    reports d/t bronchitis   Hx of colonic polyps    Hypogonadism male    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[place])    Pneumonia 2019   PONV (postoperative nausea and vomiting)    reports has a difficult time coming out of ( knee surgery)   Seborrheic keratosis    Tremor    Family History  Problem Relation Age of Onset   Heart failure Mother        age 71   Heart attack Father        age 9   Past Surgical History:  Procedure Laterality Date   CHOLECYSTECTOMY  2010   [place]     during cholecystectomy   KNEE ARTHROPLASTY  10/30/2011   Procedure: COMPUTER ASSISTED TOTAL KNEE ARTHROPLASTY;  Surgeon: Meredith Pel, MD;  Location: [place];  Service: Orthopedics;  Laterality: Right;  right total knee arthroplasty   KNEE CARTILAGE SURGERY  2009   Right knee   PARTIAL GLOSSECTOMY  2017   at [place] ARTHROSCOPY Right    TOTAL KNEE ARTHROPLASTY Left 06/30/2019   Procedure: LEFT TOTAL KNEE ARTHROPLASTY, CEMENTED;  Surgeon: Meredith Pel, MD;  Location: [facility]  OR;  Service: Orthopedics;  Laterality: Left;   TOTAL SHOULDER ARTHROPLASTY Left  02/22/2015   TOTAL SHOULDER ARTHROPLASTY Left 02/22/2015   Procedure: LEFT TOTAL SHOULDER ARTHROPLASTY;  Surgeon: Meredith Pel, MD;  Location: [facility];  Service: Orthopedics;  Laterality: Left;   Social History   Occupational History   Occupation: Retired  Tobacco Use   Smoking status: Never   Smokeless tobacco: Never  Vaping Use   Vaping Use: Never used  Substance and Sexual Activity   Alcohol use: Yes    Alcohol/week: 3.0 standard drinks    Types: 3 Glasses of wine per week    Comment: occasional   Drug use: Never   Sexual activity: Not Currently

## 2021-09-27 IMAGING — MR MR LUMBAR SPINE W/O CM
6 series · 35 of 48 positions shown · non-contrast
Comparison: Lumbar radiographs 11/16/2020.  Lumbar MRI 01/23/2011

CLINICAL DATA: Lumbar radiculopathy with right hip and leg pain

EXAM:
MRI LUMBAR SPINE WITHOUT CONTRAST
TECHNIQUE: Multiplanar, multisequence MR imaging of the lumbar spine was
performed. No intravenous contrast was administered.

[Series 2: T2 · sagittal · 4.0mm · 0.51mm/px · 6 of 15 slices shown (1 of 2)]
[im 1/15]
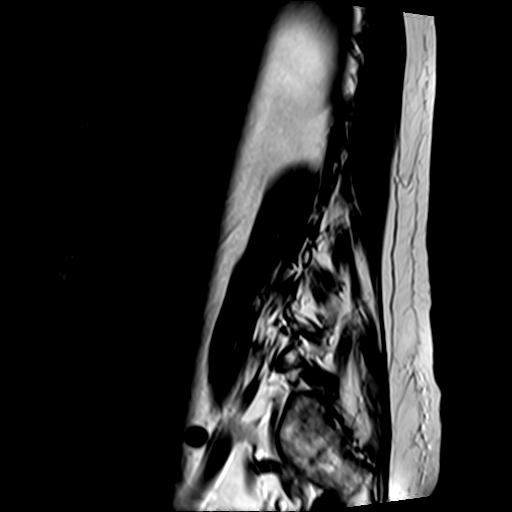
[im 3/15]
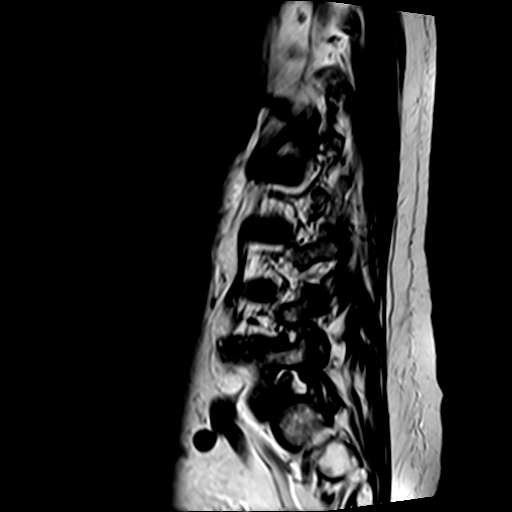
[im 6/15]
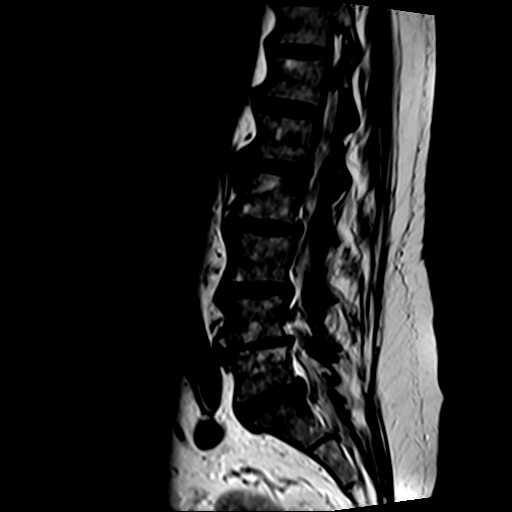
[im 9/15]
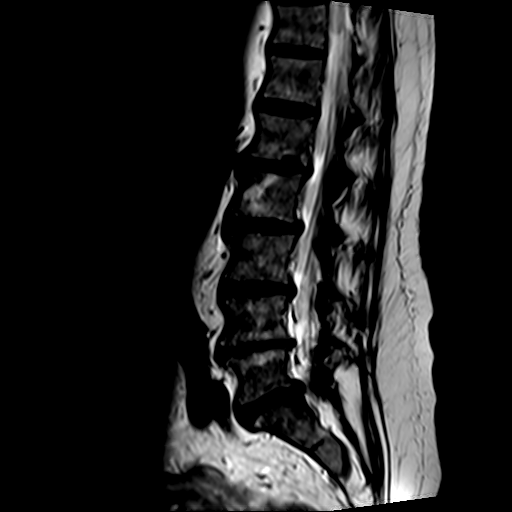
[im 12/15]
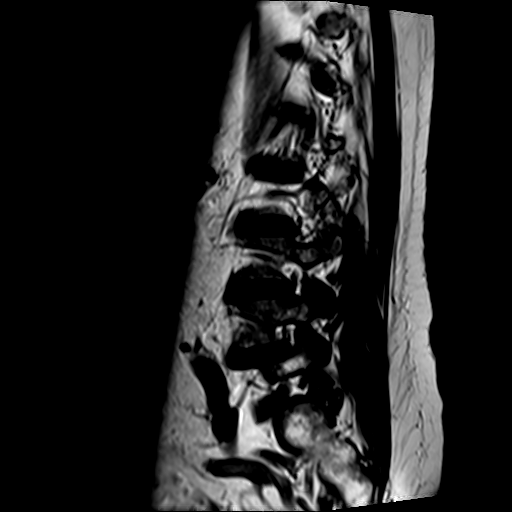
[im 15/15]
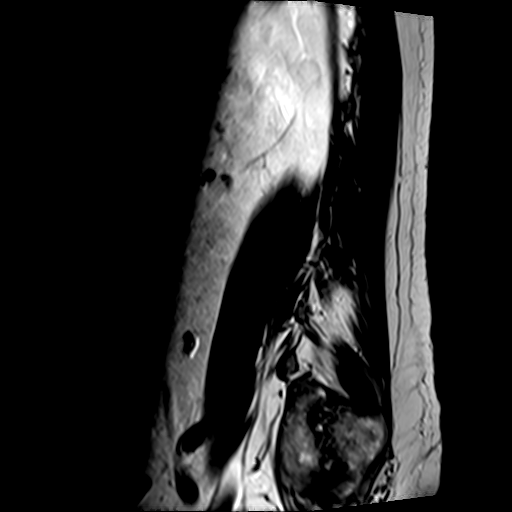

[Series 3: STIR · sagittal · 4.0mm · 1.02mm/px · 5 of 15 slices shown]
[im 1/15]
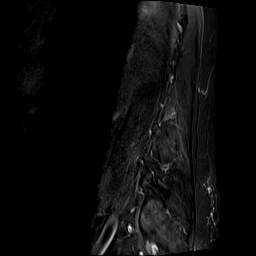
[im 4/15]
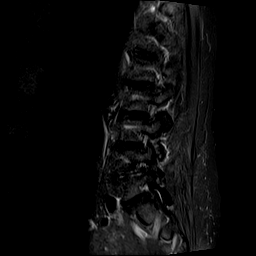
[im 8/15]
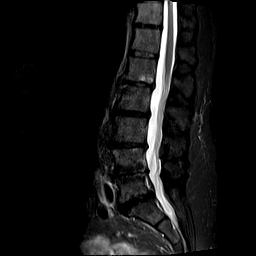
[im 11/15]
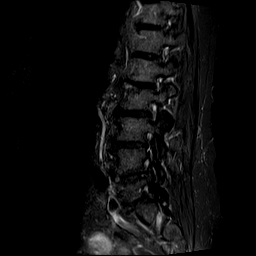
[im 15/15]
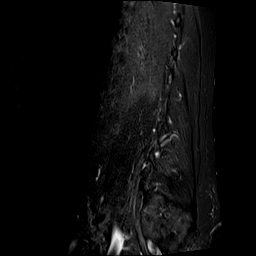

[Series 4: T1 · sagittal · 4.0mm · 0.51mm/px · 5 of 15 slices shown (1 of 2)]
[im 1/15]
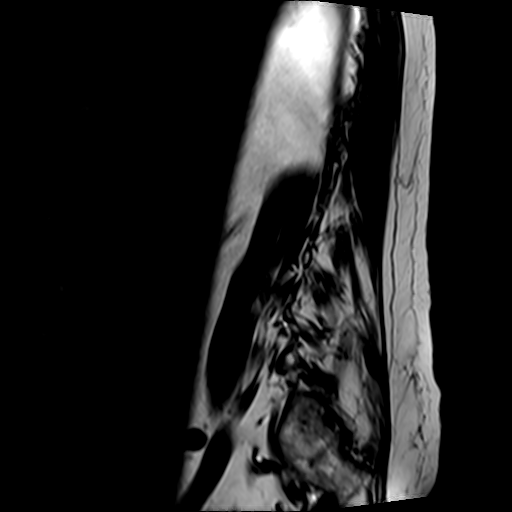
[im 4/15]
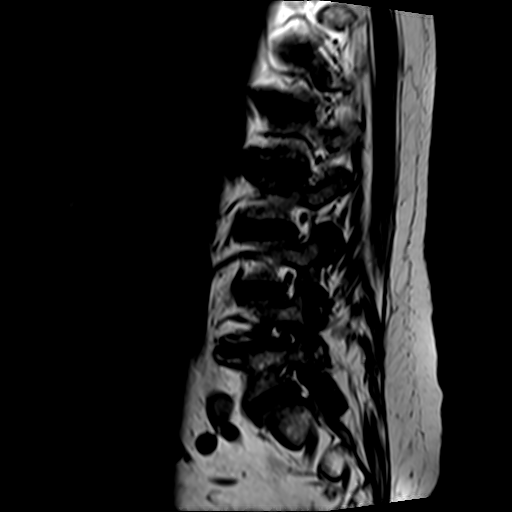
[im 8/15]
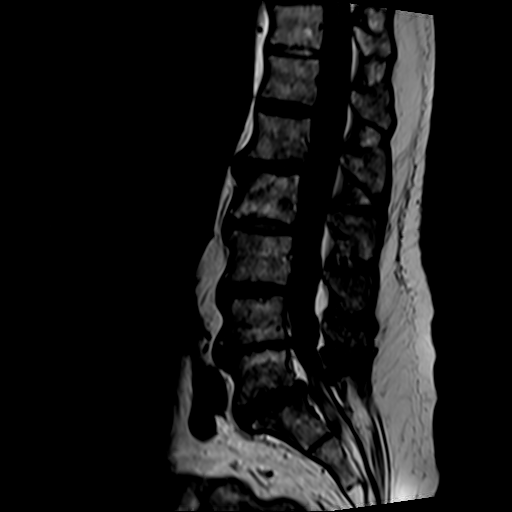
[im 11/15]
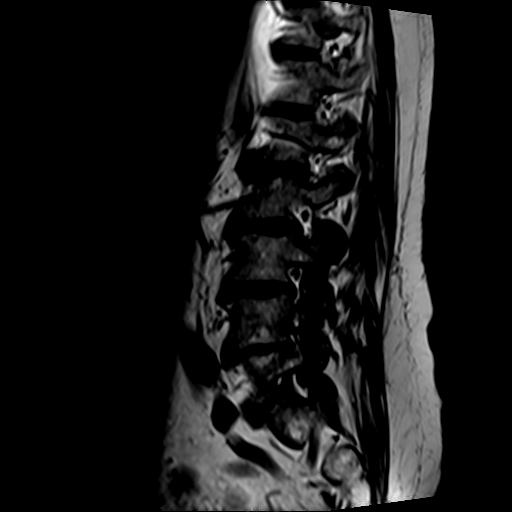
[im 15/15]
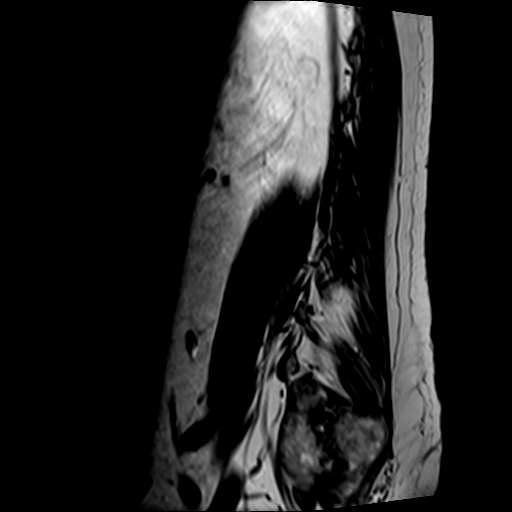

[Series 5: T2 · axial · 4.0mm · 0.78mm/px · z∈[-73,+127]mm · 9 of 37 slices shown (2 of 2)]
[im 1/37]
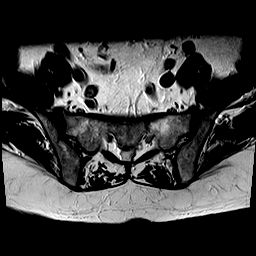
[im 7/37]
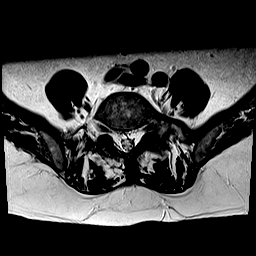
[im 13/37]
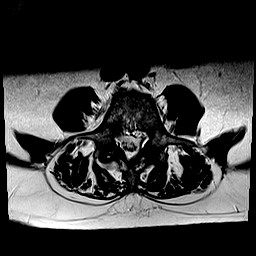
[im 16/37]
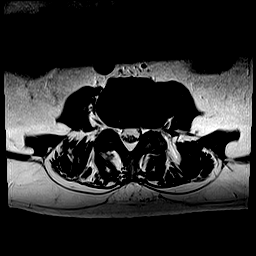
[im 19/37]
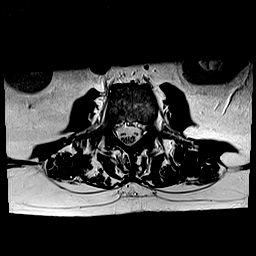
[im 22/37]
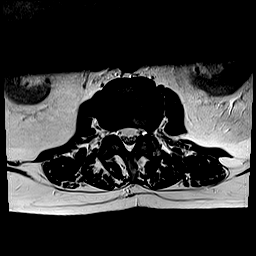
[im 25/37]
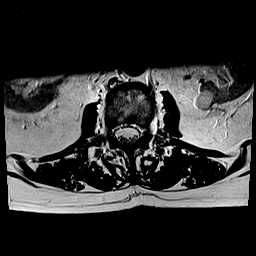
[im 31/37]
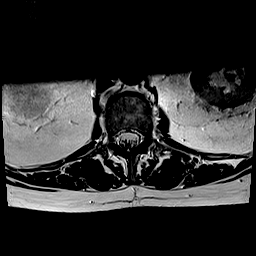
[im 37/37]
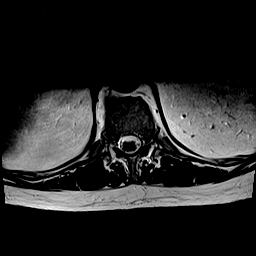

[Series 6: T1 · axial · 4.0mm · 0.78mm/px · z∈[-73,+127]mm · 9 of 37 slices shown (2 of 2)]
[im 1/37]
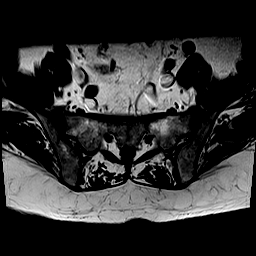
[im 7/37]
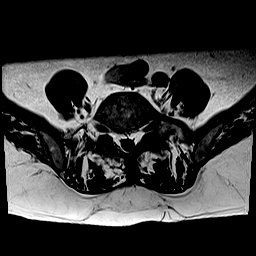
[im 13/37]
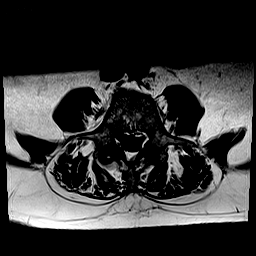
[im 16/37]
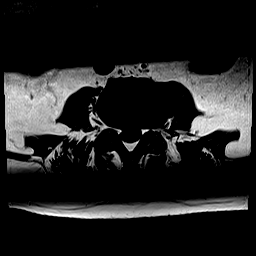
[im 19/37]
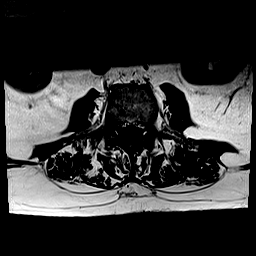
[im 22/37]
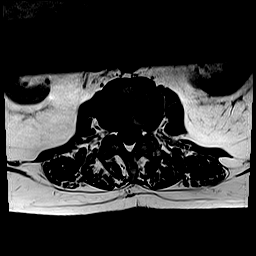
[im 25/37]
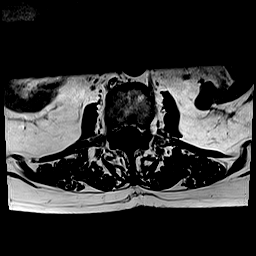
[im 31/37]
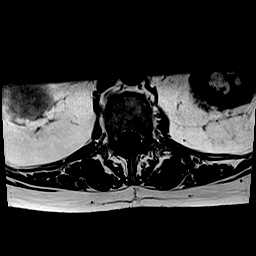
[im 37/37]
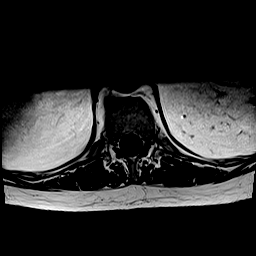

[Series 100: hx · axial · 8.0mm · 0.70mm/px · 1 of 17 slices shown]
[im 1/17]
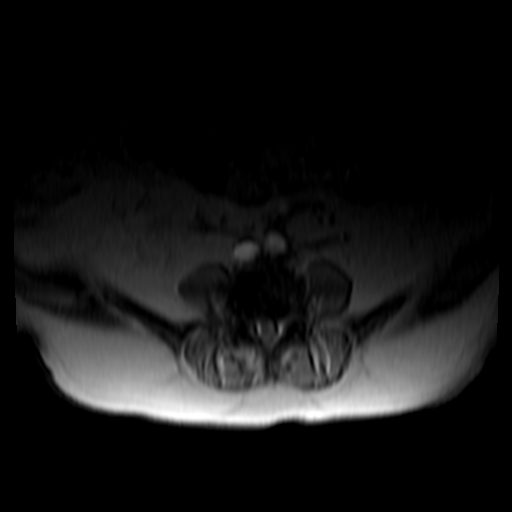

[35 of 48 positions shown; findings below may reference images not displayed]

FINDINGS: Segmentation:  Normal

Alignment: Slight retrolisthesis L1-2. Slight anterolisthesis L2-3
has improved in the interval.

Vertebrae: Negative for fracture or mass. Progressive fatty endplate
changes at L4-5 due to progressive disc degeneration since the prior
study.

Conus medullaris and cauda equina: Conus extends to the L1-2 level.
Conus and cauda equina appear normal.

Paraspinal and other soft tissues: Negative for paraspinous mass or
adenopathy

Disc levels:

L1-2: Disc degeneration with diffuse disc bulging and bilateral
facet hypertrophy. No significant stenosis

L2-3: Slight anterolisthesis. Mild disc bulging and mild facet
degeneration. No significant stenosis

L3-4: Disc degeneration with disc bulging. Shallow left
extraforaminal disc protrusion unchanged from the prior study. Mild
facet degeneration. Mild subarticular and foraminal stenosis
bilaterally

L4-5: Advanced disc degeneration has progressed in the interval.
Diffuse endplate spurring is present right greater than left. Right
L4 and L5 nerve root impingement due to spurring. Less prominent
left L4 and L5 nerve root impingement. Mild spinal stenosis

L5-S1: Diffuse disc bulging and endplate spurring. Bilateral facet
hypertrophy. Moderate subarticular stenosis bilaterally.
IMPRESSION: Multilevel disc and facet degeneration.

Right L4 and L5 nerve root impingement at L4-5 due to prominent
endplate spurring and facet hypertrophy.

## 2022-06-18 ENCOUNTER — Telehealth: Payer: Self-pay | Admitting: Orthopedic Surgery

## 2022-06-18 NOTE — Telephone Encounter (Signed)
Pt caregiver/partner called.

## 2022-06-19 ENCOUNTER — Telehealth: Payer: Self-pay | Admitting: Orthopedic Surgery

## 2022-06-19 NOTE — Telephone Encounter (Signed)
Patient fell. Partner would like him seen this week. Call back number is [phone]

## 2022-06-20 NOTE — Telephone Encounter (Signed)
Tried to call for more details vm box not set up unable to leave message. Will hold and try again.

## 2022-06-20 NOTE — Telephone Encounter (Signed)
Tried calling again. No answer.

## 2022-07-03 ENCOUNTER — Encounter: Payer: Self-pay | Admitting: Surgical

## 2022-07-03 ENCOUNTER — Ambulatory Visit: Payer: Medicare PPO | Admitting: Surgical

## 2022-07-03 DIAGNOSIS — M545 Low back pain, unspecified: Secondary | ICD-10-CM

## 2022-07-03 DIAGNOSIS — W19XXXA Unspecified fall, initial encounter: Secondary | ICD-10-CM

## 2022-07-03 NOTE — Progress Notes (Signed)
Office Visit Note   Patient: [name] Bell           Date of Birth: Oct 20, 1933           MRN: [id number] Visit Date: 07/03/2022 Requested by: Governor Specking, MD [address] [place],  [place] [address] PCP: Governor Specking, MD  Subjective: Chief Complaint  Patient presents with   Other    Follow up from fall 2 weeks ago    HPI: [name] Bell is a 88 y.o. male who presents to the office complaining of fall 2 weeks ago.  Fell backward into a bathtub.  Has an acquaintance that is with him today and states that the shower curtain broke most of the fall.  Did not really have much lingering pain or noticing any swelling or bruising immediately after the fall.  2 days later he had some numbness and tingling that day but cannot recall where this numbness and tingling was.  That resolved the next day and he has been pretty much asymptomatic and back to baseline in the last 1.5 weeks.  He reports that he is not having any difficulty getting up from a seated position or ambulating.  No groin pain bilaterally.  No swelling in either knee.  He is not having any increase in his back pain or any shooting pain down either extremity.  He occasionally will have some dizziness when he gets up from a seated or laying position but this quickly resolves..                ROS: All systems reviewed are negative as they relate to the chief complaint within the history of present illness.  Patient denies fevers or chills.  Assessment & Plan: Visit Diagnoses: No diagnosis found.  Plan: Patient is a 88 year old male who presents complaining of fall 2 weeks ago.  Had numbness and tingling 2 days later in a location that he cannot recall.  This resolved the next day.  Pretty much back to baseline since then.  Excellent strength of all muscle groups of the lower extremities.  Negative straight leg raise.  No pain with hip range of motion.  Ambulates without difficulty.  Plan is to continue with observation and  recommended he return if he has any recurrence of symptoms or another fall with any lingering symptoms.  Patient and acquaintance agreed with plan.  Follow-up as needed.  Follow-Up Instructions: No follow-ups on file.   Orders:  No orders of the defined types were placed in this encounter.  No orders of the defined types were placed in this encounter.     Procedures: No procedures performed   Clinical Data: No additional findings.  Objective: Vital Signs: There were no vitals taken for this visit.  Physical Exam:  Constitutional: Patient appears well-developed HEENT:  Head: Normocephalic Eyes:EOM are normal Neck: Normal range of motion Cardiovascular: Normal rate Pulmonary/chest: Effort normal Neurologic: Patient is alert Skin: Skin is warm Psychiatric: Patient has normal mood and affect  Ortho Exam: Ortho exam demonstrates negative straight leg raise bilaterally.  5/5 motor strength of bilateral hip flexion, quadricep, hamstring, dorsiflexion, plantarflexion.  No calf tenderness.  Negative Homans' sign.  Able to perform straight leg raise without extensor lag.  He has no effusion in either knee.  Smooth range of motion with both knees without any abnormal crepitus or pain.  No ecchymosis noted throughout either lower extremity.  No pain throughout the axial lumbar spine.  No pain  with flexion or extension of the spine.  No pain with hip range of motion bilaterally.  Specialty Comments:  No specialty comments available.  Imaging: No results found.   PMFS History: Patient Active Problem List   Diagnosis Date Noted   DM type 2 with diabetic peripheral neuropathy ([place]) 01/05/2021   Tremor 01/05/2021   Gait abnormality 01/05/2021   Arthritis of left knee 06/30/2019   Arthritis of right shoulder region 09/24/2016   Arthritis of shoulder region, left 02/22/2015   Type II or unspecified type diabetes mellitus without mention of complication, not stated as uncontrolled  02/11/2012   Hypogonadism male 02/11/2012   Arthritis of knee, right 10/30/2011   Past Medical History:  Diagnosis Date   Allergy    RHINITIS   Ambulates with cane    Anxiety    Arthritis    osteoarthritis   Asthmatic bronchitis    seasonal   Cancer (HCC)    skin cancer removed from back   Depression    Diabetes mellitus    FHx: cardiovascular disease    History of kidney stones    History of pneumonia    reports d/t bronchitis   Hx of colonic polyps    Hypogonadism male    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[place])    Pneumonia 2019   PONV (postoperative nausea and vomiting)    reports has a difficult time coming out of ( knee surgery)   Seborrheic keratosis    Tremor     Family History  Problem Relation Age of Onset   Heart failure Mother        age 43   Heart attack Father        age 17    Past Surgical History:  Procedure Laterality Date   CHOLECYSTECTOMY  2010   [place]     during cholecystectomy   KNEE ARTHROPLASTY  10/30/2011   Procedure: COMPUTER ASSISTED TOTAL KNEE ARTHROPLASTY;  Surgeon: Meredith Pel, MD;  Location: [facility];  Service: Orthopedics;  Laterality: Right;  right total knee arthroplasty   KNEE CARTILAGE SURGERY  2009   Right knee   PARTIAL GLOSSECTOMY  2017   at [place] ARTHROSCOPY Right    TOTAL KNEE ARTHROPLASTY Left 06/30/2019   Procedure: LEFT TOTAL KNEE ARTHROPLASTY, CEMENTED;  Surgeon: Meredith Pel, MD;  Location: [facility];  Service: Orthopedics;  Laterality: Left;   TOTAL SHOULDER ARTHROPLASTY Left 02/22/2015   TOTAL SHOULDER ARTHROPLASTY Left 02/22/2015   Procedure: LEFT TOTAL SHOULDER ARTHROPLASTY;  Surgeon: Meredith Pel, MD;  Location: [facility];  Service: Orthopedics;  Laterality: Left;   Social History   Occupational History   Occupation: Retired  Tobacco Use   Smoking status: Never   Smokeless tobacco: Never  Vaping Use   Vaping Use: Never used  Substance and Sexual Activity   Alcohol use: Yes     Alcohol/week: 3.0 standard drinks of alcohol    Types: 3 Glasses of wine per week    Comment: occasional   Drug use: Never   Sexual activity: Not Currently

## 2023-07-01 ENCOUNTER — Telehealth: Payer: Self-pay | Admitting: Orthopedic Surgery

## 2023-07-01 NOTE — Telephone Encounter (Signed)
Work in appt scheduled

## 2023-07-01 NOTE — Telephone Encounter (Signed)
Patient power of attorney called and wants to know if you could get him in this week because he had fallen and he fractured the right knee cap and needs to be seen as soon as possible. [phone]

## 2023-07-03 ENCOUNTER — Other Ambulatory Visit (INDEPENDENT_AMBULATORY_CARE_PROVIDER_SITE_OTHER): Payer: Self-pay

## 2023-07-03 ENCOUNTER — Encounter: Payer: Self-pay | Admitting: Orthopedic Surgery

## 2023-07-03 ENCOUNTER — Ambulatory Visit: Payer: Medicare PPO | Admitting: Orthopedic Surgery

## 2023-07-03 DIAGNOSIS — M25561 Pain in right knee: Secondary | ICD-10-CM

## 2023-07-03 DIAGNOSIS — M25511 Pain in right shoulder: Secondary | ICD-10-CM

## 2023-07-03 NOTE — Progress Notes (Signed)
Office Visit Note   Patient: [name] Bell           Date of Birth: 07-20-1934           MRN: [id number] Visit Date: 07/03/2023 Requested by: Earnest Rosier, MD [address] [place],  [place] [address] PCP: Earnest Rosier, MD  Subjective: Chief Complaint  Patient presents with   Right Knee - Pain    HPI: [name] Bell is a 89 y.o. male who presents to the office reporting right greater than left knee pain.  Patient had a fall on 920.  He is having some balance issues.  He is here with his partner [name] Maduro.  Had knee replacement about 15 years ago on the right and 13 years ago on the left.  Stairs are difficult for [employer].  He is using a walker.  Did have a CT scan of his head which was okay after his last fall 5 days ago.  Has not taken much in the way of medication for his orthopedic symptoms.  The shoulder is not really bothering him too much..                ROS: All systems reviewed are negative as they relate to the chief complaint within the history of present illness.  Patient denies fevers or chills.  Assessment & Plan: Visit Diagnoses:  1. Right shoulder pain, unspecified chronicity   2. Right knee pain, unspecified chronicity     Plan: Impression is right knee total knee replacement with some medial sided tenderness consistent with MCL strain.  Not much effusion in the right knee.  Left knee is doing well with good range of motion and no effusion.  Radiographs do not show a fracture on that right-hand side.  Right shoulder does have some arthritis which she is living with.  No intervention required for that right knee but I do think it is a good idea for him to use the walker is much as possible.  When observed getting up I think he does have a little bit of hip flexion weakness.  He will follow-up as needed.  Follow-Up Instructions: No follow-ups on file.   Orders:  Orders Placed This Encounter  Procedures   XR Shoulder Right   XR KNEE 3 VIEW RIGHT   No orders of the  defined types were placed in this encounter.     Procedures: No procedures performed   Clinical Data: No additional findings.  Objective: Vital Signs: There were no vitals taken for this visit.  Physical Exam:  Constitutional: Patient appears well-developed HEENT:  Head: Normocephalic Eyes:EOM are normal Neck: Normal range of motion Cardiovascular: Normal rate Pulmonary/chest: Effort normal Neurologic: Patient is alert Skin: Skin is warm Psychiatric: Patient has normal mood and affect  Ortho Exam: Ortho exam demonstrates no effusion in either knee.  There is a little bit of swelling on that right-hand side with MCL tenderness at the femoral attachment.  Mild tenderness with valgus stress testing at 0 and 30 degrees but the knee does not open at all.  Patella tracks well.  Extensor mechanism intact.  No tenderness around the patella or to palpation around the proximal tibia or distal femur.  Specialty Comments:  No specialty comments available.  Imaging: No results found.   PMFS History: Patient Active Problem List   Diagnosis Date Noted   DM type 2 with diabetic peripheral neuropathy (HCC) 01/05/2021   Tremor 01/05/2021   Gait abnormality 01/05/2021  Arthritis of left knee 06/30/2019   Arthritis of right shoulder region 09/24/2016   Arthritis of shoulder region, left 02/22/2015   Type II or unspecified type diabetes mellitus without mention of complication, not stated as uncontrolled 02/11/2012   Hypogonadism male 02/11/2012   Arthritis of knee, right 10/30/2011   Past Medical History:  Diagnosis Date   Allergy    RHINITIS   Ambulates with cane    Anxiety    Arthritis    osteoarthritis   Asthmatic bronchitis    seasonal   Cancer (HCC)    skin cancer removed from back   Depression    Diabetes mellitus    FHx: cardiovascular disease    History of kidney stones    History of pneumonia    reports d/t bronchitis   Hx of colonic polyps    Hypogonadism  male    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HCC)    Pneumonia 2019   PONV (postoperative nausea and vomiting)    reports has a difficult time coming out of ( knee surgery)   Seborrheic keratosis    Tremor     Family History  Problem Relation Age of Onset   Heart failure Mother        age 29   Heart attack Father        age 59    Past Surgical History:  Procedure Laterality Date   CHOLECYSTECTOMY  2010   GALL BLADDER   HERNIA REPAIR     during cholecystectomy   KNEE ARTHROPLASTY  10/30/2011   Procedure: COMPUTER ASSISTED TOTAL KNEE ARTHROPLASTY;  Surgeon: Cammy Copa, MD;  Location: [facility] OR;  Service: Orthopedics;  Laterality: Right;  right total knee arthroplasty   KNEE CARTILAGE SURGERY  2009   Right knee   PARTIAL GLOSSECTOMY  2017   at [facility]   SHOULDER ARTHROSCOPY Right    TOTAL KNEE ARTHROPLASTY Left 06/30/2019   Procedure: LEFT TOTAL KNEE ARTHROPLASTY, CEMENTED;  Surgeon: Cammy Copa, MD;  Location: [facility] OR;  Service: Orthopedics;  Laterality: Left;   TOTAL SHOULDER ARTHROPLASTY Left 02/22/2015   TOTAL SHOULDER ARTHROPLASTY Left 02/22/2015   Procedure: LEFT TOTAL SHOULDER ARTHROPLASTY;  Surgeon: Cammy Copa, MD;  Location: [facility] OR;  Service: Orthopedics;  Laterality: Left;   Social History   Occupational History   Occupation: Retired  Tobacco Use   Smoking status: Never   Smokeless tobacco: Never  Vaping Use   Vaping status: Never Used  Substance and Sexual Activity   Alcohol use: Yes    Alcohol/week: 3.0 standard drinks of alcohol    Types: 3 Glasses of wine per week    Comment: occasional   Drug use: Never   Sexual activity: Not Currently
